# Patient Record
Sex: Female | Born: 1985 | Race: White | Hispanic: No | Marital: Single | Smoking: Current some day smoker
Health system: Southern US, Community
[De-identification: ages and names within clinical notes are randomized; demographics above are authoritative.]

## PROBLEM LIST (undated history)

## (undated) DIAGNOSIS — I10 Essential (primary) hypertension: Secondary | ICD-10-CM

## (undated) DIAGNOSIS — E119 Type 2 diabetes mellitus without complications: Secondary | ICD-10-CM

## (undated) DIAGNOSIS — F909 Attention-deficit hyperactivity disorder, unspecified type: Secondary | ICD-10-CM

## (undated) DIAGNOSIS — E282 Polycystic ovarian syndrome: Secondary | ICD-10-CM

## (undated) DIAGNOSIS — J45909 Unspecified asthma, uncomplicated: Secondary | ICD-10-CM

## (undated) DIAGNOSIS — F99 Mental disorder, not otherwise specified: Secondary | ICD-10-CM

## (undated) DIAGNOSIS — F32A Depression, unspecified: Secondary | ICD-10-CM

## (undated) DIAGNOSIS — G43909 Migraine, unspecified, not intractable, without status migrainosus: Secondary | ICD-10-CM

## (undated) HISTORY — DX: Attention-deficit hyperactivity disorder, unspecified type: F90.9

## (undated) HISTORY — DX: Type 2 diabetes mellitus without complications: E11.9

## (undated) HISTORY — DX: Unspecified asthma, uncomplicated: J45.909

## (undated) HISTORY — DX: Migraine, unspecified, not intractable, without status migrainosus: G43.909

## (undated) HISTORY — DX: Essential (primary) hypertension: I10

## (undated) HISTORY — DX: Polycystic ovarian syndrome: E28.2

## (undated) HISTORY — DX: Mental disorder, not otherwise specified: F99

## (undated) HISTORY — DX: Depression, unspecified: F32.A

---
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
## 2009-12-04 ENCOUNTER — Ambulatory Visit: Payer: Self-pay | Admitting: Family Medicine

## 2009-12-04 DIAGNOSIS — F438 Other reactions to severe stress: Secondary | ICD-10-CM | POA: Insufficient documentation

## 2009-12-04 DIAGNOSIS — F4389 Other reactions to severe stress: Secondary | ICD-10-CM | POA: Insufficient documentation

## 2009-12-11 ENCOUNTER — Encounter: Payer: Self-pay | Admitting: Family Medicine

## 2009-12-27 ENCOUNTER — Telehealth (INDEPENDENT_AMBULATORY_CARE_PROVIDER_SITE_OTHER): Payer: Self-pay | Admitting: *Deleted

## 2010-01-01 ENCOUNTER — Telehealth: Payer: Self-pay | Admitting: Family Medicine

## 2010-11-26 NOTE — Assessment & Plan Note (Signed)
Summary: NOV: OCPs, mood   Vital Signs:  Patient profile:   25 year old female Height:      65 inches Weight:      208.75 pounds BMI:     34.86 Temp:     98.4 degrees F oral Pulse rate:   88 / minute BP sitting:   122 / 86  Vitals Entered By: Kandice Hams (December 04, 2009 1:55 PM) CC: new patient Comments was on BCP because of endometriosis, D/C 2008, C/O very heavy painful bleeding   CC:  new patient.  History of Present Illness: Was on birth control and came off in 2008. Hx of endometriosis. Now havnig painful heavy bleeding. Bleeds for 7 days.  Bleeding is mostly heavy.  About every 28 hours. Was on sprintec years ago and felt good on it.    Feels mood and irritable all the time. Sleeping is OK. Feels down most time.  Living with grandparents and this has been difficult.  Mom with depression, Bipolar.  Lots of mental illness in the family. No panic attacks. Mom with thyroid d/o.   Habits & Providers  Alcohol-Tobacco-Diet     Alcohol drinks/day: <1     Tobacco Status: current     Cigarette Packs/Day: 1.0  Exercise-Depression-Behavior     Have you felt down or hopeless? no     Drug Use: no     Seat Belt Use: always  Past History:  Past Medical History: None  Past Surgical History: Knee surgery 03/2006  Family History: Family History of Alcoholism/Addiction/father heart attack//father Family History Depression//mother father,grandma DM - Uncle, GM Mother with Bipllar, uncle with Bipolar Mothe wtih COPD, GFM COPD,   Social History: Single Completed 11th grade. Live wiht mother and GM.   Current Smoker Alcohol use-yes Drug use-no 3 caffeinated drinks per day.  Smoking Status:  current Packs/Day:  1.0 Drug Use:  no Seat Belt Use:  always  Review of Systems       No fever/sweats/weakness, unexplained weight loss/gain.  No vison changes.  No difficulty hearing/ringing in ears, hay fever/allergies.  No chest pain/discomfort, palpitations.  No Br  lump/nipple discharge.  No cough/wheeze.  No blood in BM, nausea/vomiting/diarrhea.  No nighttime urination, leaking urine, unusual vaginal bleeding, discharge (penis or vagina).  No muscle/joint pain. No rash, change in mole.  No HA, memory loss.  No anxiety, sleep d/o, depression.  No easy bruising/bleeding, unexplained lump   Physical Exam  General:  Well-developed,well-nourished,in no acute distress; alert,appropriate and cooperative throughout examination Neck:  No deformities, masses, or tenderness noted. No TM.   Lungs:  Normal respiratory effort, chest expands symmetrically. Lungs are clear to auscultation, no crackles or wheezes. Heart:  Normal rate and regular rhythm. S1 and S2 normal without gallop, murmur, click, rub or other extra sounds.   Impression & Recommendations:  Problem # 1:  CONTRACEPTIVE MANAGEMENT (ICD-V25.09) Discussed options.  will start sprintec fo better control of her periods and endometriosis.  This may even help her irritability since it helped in the past. Also let her know about the free pap smear screenings which she needs to do. her last pap was 3 years ago and is having heavy bleeding.    Problem # 2:  OTHER ACUTE REACTIONS TO STRESS (ICD-308.3) Discussed that hopefully being on birth control will help but also discussed starting an SSRI. Warned of potential SE. Call if any concerns. F/U in 6 weeks if able. She doesn't have insurance.  She is currently not  working.  Normally would screen thyroid but she prefers not since no insurance. Mood questionnair neg screen for Bipolar.  PHQ9 score is 4 (none to mild sxs).   Complete Medication List: 1)  Sprintec 28 0.25-35 Mg-mcg Tabs (Norgestimate-eth estradiol) .... Take 1 tablet by mouth once a day 2)  Citalopram Hydrobromide 20 Mg Tabs (Citalopram hydrobromide) .... 1/2 tab by mouth once a day, then increase to whole tab. Prescriptions: CITALOPRAM HYDROBROMIDE 20 MG TABS (CITALOPRAM HYDROBROMIDE) 1/2 tab by mouth  once a day, then increase to whole tab.  #30 x 2   Entered and Authorized by:   Nani Gasser MD   Signed by:   Nani Gasser MD on 12/04/2009   Method used:   Electronically to        [facility] [phone]* (retail)       [address] [place], [place]  [address]       Ph: [phone]       Fax: [phone]   RxID:   [id number] SPRINTEC 28 0.25-35 MG-MCG TABS (NORGESTIMATE-ETH ESTRADIOL) Take 1 tablet by mouth once a day  #90 day supl x 1   Entered and Authorized by:   Nani Gasser MD   Signed by:   Nani Gasser MD on 12/04/2009   Method used:   Electronically to        [facility] [phone]* (retail)       [address] [place], [place]  [address]       Ph: [phone]       Fax: [phone]   RxID:   [id number]

## 2010-11-26 NOTE — Progress Notes (Signed)
Summary: Can not take Citalopram  Phone Note Call from Patient Call back at Home Phone [phone]   Caller: Mom Call For: Nani Gasser MD Summary of Call: mom calls and states that the Citalopram is causing daughter to throw-up and can not take it. Wonders if you can call in something else for her to [facility] on there generic plan Initial call taken by: Kathlene November,  January 01, 2010 9:36 AM  Follow-up for Phone Call        OK will change to fluoxtine.  Follow-up by: Nani Gasser MD,  January 01, 2010 10:57 AM  Additional Follow-up for Phone Call Additional follow up Details #1::        Mom notified med sent Additional Follow-up by: Kathlene November,  January 01, 2010 11:30 AM    New/Updated Medications: FLUOXETINE HCL 10 MG CAPS (FLUOXETINE HCL) Take 1 tablet by mouth once a day Prescriptions: FLUOXETINE HCL 10 MG CAPS (FLUOXETINE HCL) Take 1 tablet by mouth once a day  #30 x 0   Entered and Authorized by:   Nani Gasser MD   Signed by:   Nani Gasser MD on 01/01/2010   Method used:   Electronically to        [facility] [phone]* (retail)       [address] [place], [place]  [address]       Ph: [phone]       Fax: [phone]   RxID:   [id number]

## 2010-11-26 NOTE — Miscellaneous (Signed)
  Clinical Lists Changes  Medications: Changed medication from CITALOPRAM HYDROBROMIDE 20 MG TABS (CITALOPRAM HYDROBROMIDE) 1/2 tab by mouth once a day, then increase to whole tab. to CITALOPRAM HYDROBROMIDE 20 MG TABS (CITALOPRAM HYDROBROMIDE) 1/2 tab by mouth once a day for 1 week, then increase to whole tab. - Signed Rx of CITALOPRAM HYDROBROMIDE 20 MG TABS (CITALOPRAM HYDROBROMIDE) 1/2 tab by mouth once a day for 1 week, then increase to whole tab.;  #30 x 3;  Signed;  Entered by: Kathlene November;  Authorized by: Nani Gasser MD;  Method used: Electronically to [facility] [address], [place], [place]  [address], Ph: [phone], Fax: [phone]    Prescriptions: CITALOPRAM HYDROBROMIDE 20 MG TABS (CITALOPRAM HYDROBROMIDE) 1/2 tab by mouth once a day for 1 week, then increase to whole tab.  #30 x 3   Entered by:   Kathlene November   Authorized by:   Nani Gasser MD   Signed by:   Kathlene November on 12/11/2009   Method used:   Electronically to        [facility] [phone]* (retail)       [address] [place], [place]  [address]       Ph: [phone]       Fax: [phone]   RxID:   [id number]

## 2010-11-26 NOTE — Progress Notes (Signed)
----   Converted from flag ---- ---- 12/27/2009 8:05 AM, Nani Gasser MD wrote:   ---- 12/27/2009 8:05 AM, Nani Gasser MD wrote: Call pt: If she wasnt to schedule a free pap smear call [phone] and the 2 free clinics they have are at our office on April 4th and in [place] on March 28, I think both start at 5:30PM.   ---- 12/04/2009 2:31 PM, Nani Gasser MD wrote: Call and give infor for free pap smear ------------------------------  Pt notified. KJ LPN

## 2010-11-29 NOTE — Letter (Signed)
Summary: Depression Questionnaire/[place] Rachael Fowler  Depression Questionnaire/[place] Rachael Fowler   Imported By: Lanelle Bal 12/07/2009 13:34:49  _____________________________________________________________________  External Attachment:    Type:   Image     Comment:   External Document

## 2017-07-14 ENCOUNTER — Encounter: Payer: Self-pay | Admitting: Osteopathic Medicine

## 2017-07-14 ENCOUNTER — Ambulatory Visit (INDEPENDENT_AMBULATORY_CARE_PROVIDER_SITE_OTHER): Payer: Self-pay | Admitting: Osteopathic Medicine

## 2017-07-14 VITALS — BP 141/84 | HR 100 | Temp 98.3°F | Ht 65.0 in | Wt 209.0 lb

## 2017-07-14 DIAGNOSIS — J4541 Moderate persistent asthma with (acute) exacerbation: Secondary | ICD-10-CM

## 2017-07-14 DIAGNOSIS — R0981 Nasal congestion: Secondary | ICD-10-CM

## 2017-07-14 DIAGNOSIS — J40 Bronchitis, not specified as acute or chronic: Secondary | ICD-10-CM

## 2017-07-14 MED ORDER — PREDNISONE 20 MG PO TABS: 20.0000 mg | ORAL_TABLET | Freq: Two times a day (BID) | ORAL | 0 refills | Status: DC

## 2017-07-14 MED ORDER — GUAIFENESIN-CODEINE 100-10 MG/5ML PO SYRP: 5.0000 mL | ORAL_SOLUTION | Freq: Four times a day (QID) | ORAL | 0 refills | Status: DC | PRN

## 2017-07-14 MED ORDER — IPRATROPIUM BROMIDE 0.06 % NA SOLN: 2.0000 | Freq: Four times a day (QID) | NASAL | 1 refills | Status: DC

## 2017-07-14 MED ORDER — METHYLPREDNISOLONE SODIUM SUCC 125 MG IJ SOLR
125.0000 mg | Freq: Once | INTRAMUSCULAR | Status: AC
  Administered 2017-07-14: 125 mg via INTRAMUSCULAR

## 2017-07-14 MED ORDER — AZITHROMYCIN 250 MG PO TABS: ORAL_TABLET | ORAL | 0 refills | Status: DC

## 2017-07-14 MED ORDER — ALBUTEROL SULFATE (2.5 MG/3ML) 0.083% IN NEBU: 2.5000 mg | INHALATION_SOLUTION | RESPIRATORY_TRACT | 6 refills | Status: DC | PRN

## 2017-07-14 NOTE — Patient Instructions (Addendum)
Plan: Asthma treatment with inhaled medications, steroids (shot in office and start oral medications in 1-2 days), and antibiotics. MUST STOP SMOKING! See below for other home remedies and OTC medications. We should consider daily inhaler to prevent asthma problems depending on your breathing - please follow-up in the office for routine checkup when you're feeling better   Over-the-Counter Medications & Home Remedies for Upper Respiratory Illness  Note: the following list assumes no pregnancy, normal liver & kidney function and no other drug interactions. Dr. Lyn Hollingshead has highlighted medications which are safe for you to use, but these may not be appropriate for everyone. Always ask a pharmacist or qualified medical provider if you have any questions!   Aches/Pains, Fever, Headache Acetaminophen (Tylenol) 500 mg tablets - take max 2 tablets (1000 mg) every 6 hours (4 times per day)  Ibuprofen (Motrin) 200 mg tablets - take max 4 tablets (800 mg) every 6 hours*  Sinus Congestion Prescription Atrovent as directed Nasal Saline if desired Oxymetolazone (Afrin, others) sparing use due to rebound congestion, NEVER use in kids Phenylephrine (Sudafed) 10 mg tablets every 4 hours (or the 12-hour formulation)* Diphenhydramine (Benadryl) 25 mg tablets - take max 2 tablets every 4 hours  Cough & Sore Throat Prescription cough pills or syrups as directed OR Dextromethorphan (Robitussin, others) - cough suppressant Guaifenesin (Robitussin, Mucinex, others) - expectorant (helps cough up mucus) (Dextromethorphan and Guaifenesin also come in a combination tablet) Other remedies:  Lozenges w/ Benzocaine + Menthol (Cepacol) Honey - as much as you want! Teas which "coat the throat" - look for ingredients Elm Bark, Licorice Root, Marshmallow Root  Other Antibiotics if these are prescribed - take ALL, even if you're feeling better  Zinc Lozenges within 24 hours of symptoms onset - mixed evidence this  shortens the duration of the common cold Don't waste your money on Vitamin C or Echinacea  *Caution in patients with high blood pressure

## 2017-07-14 NOTE — Progress Notes (Signed)
HPI: Rachael Fowler is a 31 y.o. female  who presents to [facility] today, 07/14/17,  for chief complaint of:  Chief Complaint  Patient presents with  . Establish Care    COUGH    Cough . Context: partner now sick with simlar symptoms but hers ongoing since last week. Hx asthma.  . Location/Quality: dry cough, coughing spells keeping her up  . Duration: 4 days . Assoc signs/symptoms: sore throat, headache, wheezing    Past medical, surgical, social and family history reviewed: Patient Active Problem List   Diagnosis Date Noted  . OTHER ACUTE REACTIONS TO STRESS 12/04/2009   No past surgical history on file. Social History  Substance Use Topics  . Smoking status: Current Every Day Smoker  . Smokeless tobacco: Never Used  . Alcohol use Not on file   No family history on file.   Current medication list and allergy/intolerance information reviewed:   Current Outpatient Prescriptions  Medication Sig Dispense Refill  . diphenhydramine-acetaminophen (TYLENOL PM EXTRA STRENGTH) 25-500 MG TABS tablet Take 1 tablet by mouth at bedtime as needed.    . Melatonin 10 MG CAPS Take by mouth.     No current facility-administered medications for this visit.    No Known Allergies    Review of Systems:  Constitutional:  +subjective fever, no chills, +recent illness, No unintentional weight changes. +significant fatigue.   HEENT: +frontal sinus headache, no vision change, no hearing change, +sore throat, +sinus pressure  Cardiac: No  chest pain, No  pressure, No palpitations,  Respiratory:  No  shortness of breath. +Cough  Gastrointestinal: No  abdominal pain, No  nausea, No  vomiting,  Musculoskeletal: No new myalgia/arthralgia  Skin: No  Rash,   Neurologic: No  weakness, No  dizziness  Psychiatric: No  concerns with depression, No  concerns with anxiety, No sleep problems, No mood problems  Exam:  BP (!) 141/84   Pulse 100   Temp 98.3 F  (36.8 C) (Oral)   Ht  (1.651 m)   Wt 209 lb (94.8 kg)   BMI 34.78 kg/m   Constitutional: VS see above. General Appearance: alert, well-developed, well-nourished, NAD  Eyes: Normal lids and conjunctive, non-icteric sclera  Ears, Nose, Mouth, Throat: MMM, Normal external inspection ears/nares/mouth/lips/gums. TM normal bilaterally. Pharynx/tonsils no erythema, no exudate. Nasal mucosa normal.   Neck: No masses, trachea midline. No thyroid enlargement. No tenderness/mass appreciated. No lymphadenopathy  Respiratory: Normal respiratory effort. + diffuse wheeze, no rhonchi, no rales  Cardiovascular: S1/S2 normal, no murmur, no rub/gallop auscultated. RRR. No lower extremity edema.   Musculoskeletal: Gait normal.   Neurological: Normal balance/coordination. No tremor.   Skin: warm, dry, intact. No rash/ulcer.  Psychiatric: Normal judgment/insight. Normal mood and affect. Oriented x3.      ASSESSMENT/PLAN:   Moderate persistent asthma with exacerbation - Plan: predniSONE (DELTASONE) 20 MG tablet, azithromycin (ZITHROMAX) 250 MG tablet, albuterol (PROVENTIL) (2.5 MG/3ML) 0.083% nebulizer solution  Bronchitis - Plan: predniSONE (DELTASONE) 20 MG tablet, azithromycin (ZITHROMAX) 250 MG tablet, guaiFENesin-codeine (ROBITUSSIN AC) 100-10 MG/5ML syrup, methylPREDNISolone sodium succinate (SOLU-MEDROL) 125 mg/2 mL injection 125 mg  Sinus congestion - Plan: ipratropium (ATROVENT) 0.06 % nasal spray    Patient Instructions  Plan: Asthma treatment with inhaled medications, steroids (shot in office and start oral medications in 1-2 days), and antibiotics. MUST STOP SMOKING! See below for other home remedies and OTC medications. We should consider daily inhaler to prevent asthma problems depending on your breathing - please  follow-up in the office for routine checkup when you're feeling better   Over-the-Counter Medications & Home Remedies for Upper Respiratory Illness  Note: the  following list assumes no pregnancy, normal liver & kidney function and no other drug interactions. Dr. Lyn Hollingshead has highlighted medications which are safe for you to use, but these may not be appropriate for everyone. Always ask a pharmacist or qualified medical provider if you have any questions!   Aches/Pains, Fever, Headache Acetaminophen (Tylenol) 500 mg tablets - take max 2 tablets (1000 mg) every 6 hours (4 times per day)  Ibuprofen (Motrin) 200 mg tablets - take max 4 tablets (800 mg) every 6 hours*  Sinus Congestion Prescription Atrovent as directed Nasal Saline if desired Oxymetolazone (Afrin, others) sparing use due to rebound congestion, NEVER use in kids Phenylephrine (Sudafed) 10 mg tablets every 4 hours (or the 12-hour formulation)* Diphenhydramine (Benadryl) 25 mg tablets - take max 2 tablets every 4 hours  Cough & Sore Throat Prescription cough pills or syrups as directed OR Dextromethorphan (Robitussin, others) - cough suppressant Guaifenesin (Robitussin, Mucinex, others) - expectorant (helps cough up mucus) (Dextromethorphan and Guaifenesin also come in a combination tablet) Other remedies:  Lozenges w/ Benzocaine + Menthol (Cepacol) Honey - as much as you want! Teas which "coat the throat" - look for ingredients Elm Bark, Licorice Root, Marshmallow Root  Other Antibiotics if these are prescribed - take ALL, even if you're feeling better  Zinc Lozenges within 24 hours of symptoms onset - mixed evidence this shortens the duration of the common cold Don't waste your money on Vitamin C or Echinacea  *Caution in patients with high blood pressure       Visit summary with medication list and pertinent instructions was printed for patient to review. All questions at time of visit were answered - patient instructed to contact office with any additional concerns. ER/RTC precautions were reviewed with the patient. Follow-up plan: Return if symptoms worsen or fail to  improve, and for asthma .  Note: Total time spent 30 minutes, greater than 50% of the visit was spent face-to-face counseling and coordinating care for the following: The primary encounter diagnosis was Moderate persistent asthma with exacerbation. Diagnoses of Bronchitis and Sinus congestion were also pertinent to this visit.[name]

## 2017-07-15 ENCOUNTER — Encounter: Payer: Self-pay | Admitting: Osteopathic Medicine

## 2017-07-23 ENCOUNTER — Telehealth: Payer: Self-pay | Admitting: Osteopathic Medicine

## 2017-07-23 NOTE — Telephone Encounter (Signed)
error 

## 2017-07-23 NOTE — Telephone Encounter (Signed)
Called patient to advise that paperwork is ready for pickup but was unable to reach the patient due to phone number no longer being in service, also the home number belongs to a female so I did not leave a message . Edel Rivero,CMA

## 2017-07-23 NOTE — Telephone Encounter (Signed)
Please call patient: I have completed her FMLA paperwork and left this up front for her to pick up, we are unable to fax to directly since she did not sign the appropriate release. She would like to stop by the office to sign and we can fax it at that time, or she can take it herself to her HR department

## 2017-08-07 ENCOUNTER — Ambulatory Visit: Payer: Self-pay | Admitting: Osteopathic Medicine

## 2017-08-19 ENCOUNTER — Encounter: Payer: Self-pay | Admitting: Osteopathic Medicine

## 2017-08-19 ENCOUNTER — Ambulatory Visit (INDEPENDENT_AMBULATORY_CARE_PROVIDER_SITE_OTHER): Payer: Self-pay | Admitting: Osteopathic Medicine

## 2017-08-19 VITALS — BP 125/86 | HR 92 | Ht 64.0 in | Wt 215.0 lb

## 2017-08-19 DIAGNOSIS — E119 Type 2 diabetes mellitus without complications: Secondary | ICD-10-CM

## 2017-08-19 DIAGNOSIS — Z0189 Encounter for other specified special examinations: Secondary | ICD-10-CM

## 2017-08-19 DIAGNOSIS — Z3490 Encounter for supervision of normal pregnancy, unspecified, unspecified trimester: Secondary | ICD-10-CM

## 2017-08-19 DIAGNOSIS — E1159 Type 2 diabetes mellitus with other circulatory complications: Secondary | ICD-10-CM | POA: Insufficient documentation

## 2017-08-19 HISTORY — DX: Type 2 diabetes mellitus without complications: E11.9

## 2017-08-19 LAB — POCT URINE PREGNANCY: Preg Test, Ur: POSITIVE — AB

## 2017-08-19 LAB — POCT GLYCOSYLATED HEMOGLOBIN (HGB A1C): Hemoglobin A1C: 7.7

## 2017-08-19 MED ORDER — METFORMIN HCL 1000 MG PO TABS: 1000.0000 mg | ORAL_TABLET | Freq: Two times a day (BID) | ORAL | 1 refills | Status: DC

## 2017-08-19 MED ORDER — PRENATAL VITAMIN PLUS LOW IRON 27-1 MG PO TABS: 1.0000 | ORAL_TABLET | Freq: Every day | ORAL | 2 refills | Status: DC

## 2017-08-19 NOTE — Progress Notes (Signed)
HPI: Rachael Fowler is a 31 y.o. female  who [facility], 08[facility]/17,  for chief complaint of:  Chief Complaint  Patient presents with  . Other    she thinks blood sugar is low and thinks she is pregnant    Sugar: been checking at home With mother's glucometer and has been consistently running in the high 100s or low to mid 200s. Has noticed some increased thirst.  LMP 9/12 or 9/19. Has taken 2 positive home pregnancy test. Pregnancy test here in the office today is also positive.    Past medical history, surgical history, social history and family history reviewed.  Patient Active Problem List   Diagnosis Date Noted  . OTHER ACUTE REACTIONS TO STRESS 12/04/2009    Current medication list and allergy/intolerance information reviewed.   Current Outpatient Prescriptions on File Prior to Visit  Medication Sig Dispense Refill  . albuterol (PROVENTIL) (2.5 MG/3ML) 0.083% nebulizer solution Take 3 mLs (2.5 mg total) by nebulization every 4 (four) hours as needed for wheezing or shortness of breath (please include nebulizer machine, hoses, and mask if needed.). 30 vial 6  . diphenhydramine-acetaminophen (TYLENOL PM EXTRA STRENGTH) 25-500 MG TABS tablet Take 1 tablet by mouth at bedtime as needed.    . Melatonin 10 MG CAPS Take by mouth.     No current facility-administered medications on file prior to visit.    No Known Allergies    Review of Systems:  Constitutional: No recent illness  Cardiac: No  chest pain, No  pressure, No palpitations  Respiratory:  No  shortness of breath.  Gastrointestinal: No  abdominal pain  Neurologic: No  weakness, No  Dizziness  Psychiatric: No  concerns with depression, No  concerns with anxiety  Exam:  BP 125/86   Pulse 92   Ht 5\' 4"  (1.626 m)   Wt 215 lb (97.5 kg)   BMI 36.90 kg/m   Constitutional: VS see above. General Appearance: alert, well-developed, well-nourished, NAD  Eyes:  Normal lids and conjunctive, non-icteric sclera  Ears, Nose, Mouth, Throat: MMM, Normal external inspection ears/nares/mouth/lips/gums.  Neck: No masses, trachea midline.   Respiratory: Normal respiratory effort. no wheeze, no rhonchi, no rales  Cardiovascular: S1/S2 normal, no murmur, no rub/gallop auscultated. RRR.   Musculoskeletal: Gait normal. Symmetric and independent movement of all extremities  Neurological: Normal balance/coordination. No tremor.  Skin: warm, dry, intact.   Psychiatric: Normal judgment/insight. Normal mood and affect. Oriented x3.   Results for orders placed or performed in visit on 08/19/17 (from the past 24 hour(s))  POCT HgB A1C     Status: None   Collection Time: 08/19/17  1:44 PM  Result Value Ref Range   Hemoglobin A1C 7.7   POCT urine pregnancy     Status: Abnormal   Collection Time: 08/19/17  1:44 PM  Result Value Ref Range   Preg Test, Ur Positive (A) Negative     ASSESSMENT/PLAN:   Type 2 diabetes mellitus without complication, without long-term current use of insulin (HCC) - Plan: metFORMIN (GLUCOPHAGE) 1000 MG tablet  Patient request for diagnostic testing - Plan: POCT HgB A1C  Pregnancy, unspecified gestational age - Plan: POCT urine pregnancy, Ambulatory referral to Obstetrics / Gynecology    Patient Instructions  Plan:  Referral to OBGYN  Start Metformin, start at 1/2 tablet daily, then increase to 1/2 twice daily, and so on until whole tablet twice daily - unless OBGYN says otherwise   Will hold off  on labs for now until you get Medicaid, but I wouldn't wait more than 4-6 weeks   I think nicotine replacement should be fine, try at lowest dose to start   See below for other info re: diabetes and diet  See attached for medications that are ok in pregnancy, and other general info        Follow-up plan: Return for recheck as needed, otherwise as directed by OBGYN .  Visit summary with medication list and pertinent  instructions was printed for patient to review, alert [place] if any changes needed. All questions at time of visit were answered - patient instructed to contact office with any additional concerns. ER/RTC precautions were reviewed with the patient and understanding verbalized.   Note: Total time spent 25 minutes, greater than 50% of the visit was spent face-to-face counseling and coordinating care for the following: The primary encounter diagnosis was Type 2 diabetes mellitus without complication, without long-term current use of insulin (HCC). Diagnoses of Patient request for diagnostic testing and Pregnancy, unspecified gestational age were also pertinent to this visit.[name]

## 2017-08-19 NOTE — Patient Instructions (Addendum)
Plan:  Referral to OBGYN  Start Metformin, start at 1/2 tablet daily, then increase to 1/2 twice daily, and so on until whole tablet twice daily - unless OBGYN says otherwise   Will hold off on labs for now until you get Medicaid, but I wouldn't wait more than 4-6 weeks   I think nicotine replacement should be fine, try at lowest dose to start   See below for other info re: diabetes and diet      Carbohydrate Counting for Diabetes Mellitus, Adult Carbohydrate counting is a method for keeping track of how many carbohydrates you eat. Eating carbohydrates naturally increases the amount of sugar (glucose) in the blood. Counting how many carbohydrates you eat helps keep your blood glucose within normal limits, which helps you manage your diabetes (diabetes mellitus). It is important to know how many carbohydrates you can safely have in each meal. This is different for every person. A diet and nutrition specialist (registered dietitian) can help you make a meal plan and calculate how many carbohydrates you should have at each meal and snack. Carbohydrates are found in the following foods: Grains, such as breads and cereals. Dried beans and soy products. Starchy vegetables, such as potatoes, peas, and corn. Fruit and fruit juices. Milk and yogurt. Sweets and snack foods, such as cake, cookies, candy, chips, and soft drinks.  How do I count carbohydrates? There are two ways to count carbohydrates in food. You can use either of the methods or a combination of both. Reading "Nutrition Facts" on packaged food The "Nutrition Facts" list is included on the labels of almost all packaged foods and beverages in the [place] It includes: The serving size. Information about nutrients in each serving, including the grams (g) of carbohydrate per serving.  To use the "Nutrition Facts": Decide how many servings you will have. Multiply the number of servings by the number of carbohydrates per serving. The  resulting number is the total amount of carbohydrates that you will be having.  Learning standard serving sizes of other foods When you eat foods containing carbohydrates that are not packaged or do not include "Nutrition Facts" on the label, you need to measure the servings in order to count the amount of carbohydrates: Measure the foods that you will eat with a food scale or measuring cup, if needed. Decide how many standard-size servings you will eat. Multiply the number of servings by 15. Most carbohydrate-rich foods have about 15 g of carbohydrates per serving. For example, if you eat 8 oz (170 g) of strawberries, you will have eaten 2 servings and 30 g of carbohydrates (2 servings x 15 g = 30 g). For foods that have more than one food mixed, such as soups and casseroles, you must count the carbohydrates in each food that is included.  The following list contains standard serving sizes of common carbohydrate-rich foods. Each of these servings has about 15 g of carbohydrates:  hamburger bun or  English muffin.  oz (15 mL) syrup.  oz (14 g) jelly. 1 slice of bread. 1 six-inch tortilla. 3 oz (85 g) cooked rice or pasta. 4 oz (113 g) cooked dried beans. 4 oz (113 g) starchy vegetable, such as peas, corn, or potatoes. 4 oz (113 g) hot cereal. 4 oz (113 g) mashed potatoes or  of a large baked potato. 4 oz (113 g) canned or frozen fruit. 4 oz (120 mL) fruit juice. 4-6 crackers. 6 chicken nuggets. 6 oz (170 g) unsweetened dry cereal. 6  oz (170 g) plain fat-free yogurt or yogurt sweetened with artificial sweeteners. 8 oz (240 mL) milk. 8 oz (170 g) fresh fruit or one small piece of fruit. 24 oz (680 g) popped popcorn.  Example of carbohydrate counting Sample meal 3 oz (85 g) chicken breast. 6 oz (170 g) brown rice. 4 oz (113 g) corn. 8 oz (240 mL) milk. 8 oz (170 g) strawberries with sugar-free whipped topping. Carbohydrate calculation Identify the foods that contain  carbohydrates: Rice. Corn. Milk. Strawberries. Calculate how many servings you have of each food: 2 servings rice. 1 serving corn. 1 serving milk. 1 serving strawberries. Multiply each number of servings by 15 g: 2 servings rice x 15 g = 30 g. 1 serving corn x 15 g = 15 g. 1 serving milk x 15 g = 15 g. 1 serving strawberries x 15 g = 15 g. Add together all of the amounts to find the total grams of carbohydrates eaten: 30 g + 15 g + 15 g + 15 g = 75 g of carbohydrates total. This information is not intended to replace advice given to you by your health care provider. Make sure you discuss any questions you have with your health care provider. Document Released: 10/13/2005 Document Revised: 05/02/2016 Document Reviewed: 03/26/2016 Elsevier Interactive Patient Education  2018 [employer].   Diabetes Mellitus and Food It is important for you to manage your blood sugar (glucose) level. Your blood glucose level can be greatly affected by what you eat. Eating healthier foods in the appropriate amounts throughout the day at about the same time each day will help you control your blood glucose level. It can also help slow or prevent worsening of your diabetes mellitus. Healthy eating may even help you improve the level of your blood pressure and reach or maintain a healthy weight. General recommendations for healthful eating and cooking habits include:  Eating meals and snacks regularly. Avoid going long periods of time without eating to lose weight.  Eating a diet that consists mainly of plant-based foods, such as fruits, vegetables, nuts, legumes, and whole grains.  Using low-heat cooking methods, such as baking, instead of high-heat cooking methods, such as deep frying.  Work with your dietitian to make sure you understand how to use the Nutrition Facts information on food labels. How can food affect me? Carbohydrates Carbohydrates affect your blood glucose level more than any other  type of food. Your dietitian will help you determine how many carbohydrates to eat at each meal and teach you how to count carbohydrates. Counting carbohydrates is important to keep your blood glucose at a healthy level, especially if you are using insulin or taking certain medicines for diabetes mellitus. Alcohol Alcohol can cause sudden decreases in blood glucose (hypoglycemia), especially if you use insulin or take certain medicines for diabetes mellitus. Hypoglycemia can be a life-threatening condition. Symptoms of hypoglycemia (sleepiness, dizziness, and disorientation) are similar to symptoms of having too much alcohol. If your health care provider has given you approval to drink alcohol, do so in moderation and use the following guidelines:  Women should not have more than one drink per day, and men should not have more than two drinks per day. One drink is equal to: ? 12 oz of beer. ? 5 oz of wine. ? 1 oz of hard liquor.  Do not drink on an empty stomach.  Keep yourself hydrated. Have water, diet soda, or unsweetened iced tea.  Regular soda, juice, and other mixers might  contain a lot of carbohydrates and should be counted.  What foods are not recommended? As you make food choices, it is important to remember that all foods are not the same. Some foods have fewer nutrients per serving than other foods, even though they might have the same number of calories or carbohydrates. It is difficult to get your body what it needs when you eat foods with fewer nutrients. Examples of foods that you should avoid that are high in calories and carbohydrates but low in nutrients include:  Trans fats (most processed foods list trans fats on the Nutrition Facts label).  Regular soda.  Juice.  Candy.  Sweets, such as cake, pie, doughnuts, and cookies.  Fried foods.  What foods can I eat? Eat nutrient-rich foods, which will nourish your body and keep you healthy. The food you should eat also  will depend on several factors, including:  The calories you need.  The medicines you take.  Your weight.  Your blood glucose level.  Your blood pressure level.  Your cholesterol level.  You should eat a variety of foods, including:  Protein. ? Lean cuts of meat. ? Proteins low in saturated fats, such as fish, egg whites, and beans. Avoid processed meats.  Fruits and vegetables. ? Fruits and vegetables that may help control blood glucose levels, such as apples, mangoes, and yams.  Dairy products. ? Choose fat-free or low-fat dairy products, such as milk, yogurt, and cheese.  Grains, bread, pasta, and rice. ? Choose whole grain products, such as multigrain bread, whole oats, and brown rice. These foods may help control blood pressure.  Fats. ? Foods containing healthful fats, such as nuts, avocado, olive oil, canola oil, and fish.  Does everyone with diabetes mellitus have the same meal plan? Because every person with diabetes mellitus is different, there is not one meal plan that works for everyone. It is very important that you meet with a dietitian who will help you create a meal plan that is just right for you. This information is not intended to replace advice given to you by your health care provider. Make sure you discuss any questions you have with your health care provider. Document Released: 07/10/2005 Document Revised: 03/20/2016 Document Reviewed: 09/09/2013 Elsevier Interactive Patient Education  2017 [employer]

## 2017-08-26 ENCOUNTER — Telehealth: Payer: Self-pay | Admitting: Osteopathic Medicine

## 2017-08-26 NOTE — Telephone Encounter (Signed)
Pt left VM on triage line that she was spotting and cramping. She is [redacted] weeks pregnant.   When I called back, she was already at the ED for evaluation. No further concerns at this time.

## 2017-11-19 DIAGNOSIS — O10013 Pre-existing essential hypertension complicating pregnancy, third trimester: Secondary | ICD-10-CM

## 2017-12-02 ENCOUNTER — Ambulatory Visit (INDEPENDENT_AMBULATORY_CARE_PROVIDER_SITE_OTHER): Payer: Medicaid Other | Admitting: Osteopathic Medicine

## 2017-12-02 ENCOUNTER — Encounter: Payer: Self-pay | Admitting: Osteopathic Medicine

## 2017-12-02 VITALS — BP 125/64 | HR 105 | Temp 98.4°F | Wt 215.0 lb

## 2017-12-02 DIAGNOSIS — R591 Generalized enlarged lymph nodes: Secondary | ICD-10-CM

## 2017-12-02 DIAGNOSIS — R0989 Other specified symptoms and signs involving the circulatory and respiratory systems: Secondary | ICD-10-CM

## 2017-12-02 NOTE — Progress Notes (Signed)
HPI: Rachael Fowler is a 32 y.o. female who  has a past medical history of Asthma.  she [facility] today, 12/02/17,  for chief complaint of: Knot in the throat   Concern for lump/knot in the R side of her jaw for years. Month and a half past, causing more discomfort. Had it checked at some point with OBGYN and was told it was swollen LN. Now causing ear and jaw pain. Currently [redacted] weeks pregnant. Reports episode of choking on water last night, coughed up small streak of blood, no other dysphagia.      Past medical, surgical, social and family history reviewed:  Patient Active Problem List   Diagnosis Date Noted  . Type 2 diabetes mellitus without complication, without long-term current use of insulin (HCC) 08/19/2017  . Pregnancy 08/19/2017  . OTHER ACUTE REACTIONS TO STRESS 12/04/2009    Past Surgical History:  Procedure Laterality Date  . KNEE SURGERY  04/20/2006    Social History   Tobacco Use  . Smoking status: Current Every Day Smoker  . Smokeless tobacco: Never Used  Substance Use Topics  . Alcohol use: Not on file    Family History  Problem Relation Age of Onset  . Depression Mother   . Diabetes Mother   . Hyperlipidemia Mother   . Hypertension Mother   . Hyperlipidemia Father   . Hypertension Father   . Cancer Maternal Uncle   . Diabetes Maternal Uncle   . Cancer Maternal Grandmother   . Hypertension Maternal Grandmother   . Hyperlipidemia Maternal Grandmother   . Hyperlipidemia Paternal Grandmother      Current medication list and allergy/intolerance information reviewed:    Current Outpatient Medications  Medication Sig Dispense Refill  . albuterol (PROVENTIL) (2.5 MG/3ML) 0.083% nebulizer solution Take 3 mLs (2.5 mg total) by nebulization every 4 (four) hours as needed for wheezing or shortness of breath (please include nebulizer machine, hoses, and mask if needed.). 30 vial 6  .  diphenhydramine-acetaminophen (TYLENOL PM EXTRA STRENGTH) 25-500 MG TABS tablet Take 1 tablet by mouth at bedtime as needed.    . Melatonin 10 MG CAPS Take by mouth.    . metFORMIN (GLUCOPHAGE) 1000 MG tablet Take 1 tablet (1,000 mg total) by mouth 2 (two) times daily with a meal. 180 tablet 1  . Prenatal Vit-Fe Fumarate-FA (PRENATAL VITAMIN PLUS LOW IRON) 27-1 MG TABS Take 1 tablet by mouth daily. 90 tablet 2   No current facility-administered medications for this visit.     No Known Allergies    Review of Systems:  Constitutional:  No  fever, no chills, No recent illness  HEENT: No  headache, no vision change, no hearing change, No sore throat, No  sinus pressure, +R ear pain  Cardiac: No  chest pain, No  pressure, No palpitations, No  Orthopnea  Respiratory:  No  shortness of breath. No  Cough  Gastrointestinal: No  abdominal pain, No  nausea  Musculoskeletal: No new myalgia/arthralgia  Skin: No  Rash   Exam:  BP 125/64   Pulse (!) 105   Temp 98.4 F (36.9 C) (Oral)   Wt 215 lb 0.6 oz (97.5 kg)   BMI 36.91 kg/m   Constitutional: VS see above. General Appearance: alert, well-developed, well-nourished, NAD  Eyes: Normal lids and conjunctive, non-icteric sclera  Ears, Nose, Mouth, Throat: MMM, Normal external inspection ears/nares/mouth/lips/gums. TM normal bilaterally. Pharynx/tonsils no erythema, no exudate. Nasal mucosa normal.Fair dentition, no gingivitis or  abscess noted on R  Neck: No masses, trachea midline. No thyroid enlargement. No tenderness/mass appreciated. No enlarged lymph nodes submental, submandibular, ant/post cervical or supraclavicular. (+)tender LN not enlarged on R submandibular   Respiratory: Normal respiratory effort.   Musculoskeletal: Gait normal. No clubbing/cyanosis of digits.   Neurological: Normal balance/coordination. No tremor.   Skin: warm, dry, intact. No rash/ulcer. No concerning nevi or subq nodules on limited exam.     Psychiatric: Normal judgment/insight. Normal mood and affect. Oriented x3.      ASSESSMENT/PLAN:   Tenderness of lymph node - Plan: CBC with Differential/Platelet, US Soft Tissue Head/Neck    QUIT SMOKING - this will cause throat cancer, don't worry about that w/ this LN now. I think tender lymph node is most likely nothing to worry about but with the pain she is having and the anxiety this is causing she would like to pursue workup. Advised CBC in pregnancy can show increased WBC so this may not help [place] much. [place] usually leads to CT to biopsy which is often normal, etc. Pt aware and wishes to proceed.     Visit summary with medication list and pertinent instructions was printed for patient to review. All questions at time of visit were answered - patient instructed to contact office with any additional concerns. ER/RTC precautions were reviewed with the patient.   Follow-up plan: Return if symptoms worsen or fail to improve.  Note: Total time spent 25 minutes, greater than 50% of the visit was spent face-to-face counseling and coordinating care for the following: The encounter diagnosis was Tenderness of lymph node..  Please note: voice recognition software was used to produce this document, and typos may escape review. Please contact Dr. Lyn Hollingshead for any needed clarifications.

## 2017-12-03 ENCOUNTER — Other Ambulatory Visit: Payer: Medicaid Other

## 2017-12-03 ENCOUNTER — Other Ambulatory Visit: Payer: Self-pay | Admitting: Osteopathic Medicine

## 2017-12-03 DIAGNOSIS — D729 Disorder of white blood cells, unspecified: Secondary | ICD-10-CM

## 2017-12-03 NOTE — Progress Notes (Signed)
Smear review needed

## 2017-12-04 ENCOUNTER — Telehealth: Payer: Self-pay | Admitting: Osteopathic Medicine

## 2017-12-04 LAB — CBC WITH DIFFERENTIAL/PLATELET
BASOS PCT: 0.4 %
Basophils Absolute: 80 cells/uL (ref 0–200)
Eosinophils Absolute: 259 cells/uL (ref 15–500)
Eosinophils Relative: 1.3 %
HCT: 33 % — ABNORMAL LOW (ref 35.0–45.0)
Hemoglobin: 11.5 g/dL — ABNORMAL LOW (ref 11.7–15.5)
Lymphs Abs: 3522 cells/uL (ref 850–3900)
MCH: 29.9 pg (ref 27.0–33.0)
MCHC: 34.8 g/dL (ref 32.0–36.0)
MCV: 85.9 fL (ref 80.0–100.0)
MONOS PCT: 4.4 %
MPV: 10.3 fL (ref 7.5–12.5)
NEUTROS PCT: 76.2 %
Neutro Abs: 15164 cells/uL — ABNORMAL HIGH (ref 1500–7800)
PLATELETS: 414 10*3/uL — AB (ref 140–400)
RBC: 3.84 10*6/uL (ref 3.80–5.10)
RDW: 12.7 % (ref 11.0–15.0)
TOTAL LYMPHOCYTE: 17.7 %
WBC: 19.9 10*3/uL — ABNORMAL HIGH (ref 3.8–10.8)
WBCMIX: 876 {cells}/uL (ref 200–950)

## 2017-12-04 LAB — PATHOLOGIST SMEAR REVIEW

## 2017-12-04 NOTE — Telephone Encounter (Signed)
Can call and let patient know insurance may not cover since not a pregnancy related issue. I'll work on this when I'm back in the office Monday.

## 2017-12-04 NOTE — Telephone Encounter (Signed)
Insurance denied US order. Provider can call to appeal. Phone: [phone]. Case: [id number]. Will route.

## 2017-12-04 NOTE — Telephone Encounter (Signed)
Pt advised of status update. Verbalized understanding.

## 2017-12-23 ENCOUNTER — Ambulatory Visit (INDEPENDENT_AMBULATORY_CARE_PROVIDER_SITE_OTHER): Payer: Medicaid Other | Admitting: Osteopathic Medicine

## 2017-12-23 ENCOUNTER — Encounter: Payer: Self-pay | Admitting: Osteopathic Medicine

## 2017-12-23 VITALS — BP 127/74 | HR 104 | Temp 98.2°F | Wt 217.1 lb

## 2017-12-23 DIAGNOSIS — R591 Generalized enlarged lymph nodes: Secondary | ICD-10-CM

## 2017-12-23 DIAGNOSIS — R0989 Other specified symptoms and signs involving the circulatory and respiratory systems: Secondary | ICD-10-CM

## 2017-12-23 DIAGNOSIS — D72829 Elevated white blood cell count, unspecified: Secondary | ICD-10-CM

## 2017-12-23 DIAGNOSIS — R198 Other specified symptoms and signs involving the digestive system and abdomen: Secondary | ICD-10-CM

## 2017-12-23 DIAGNOSIS — I899 Noninfective disorder of lymphatic vessels and lymph nodes, unspecified: Secondary | ICD-10-CM

## 2017-12-23 NOTE — Patient Instructions (Signed)
I can go ahead and send a referral to ENT who may be able to look into the throat to see what may be causing the choking sensation. I would also recommend you check with the imaging department downstairs about their policy for Medicaid/self-pay patients to get an ultrasound performed.   At this point, if we are not able to get imaging or direct visualization from an ENT specialist, there is not much more I can tell you about the issue. I do not feel any significant enlargement on exam. We can keep monitoring the blood counts and go from there. If they increased her medically, we should seriously consider specialist referral or further testing.

## 2017-12-23 NOTE — Progress Notes (Signed)
HPI: Rachael Fowler is a 32 y.o. female who  has a past medical history of Asthma.  she presents to [facility] today, 12/23/17,  for chief complaint of: Knot in the throat   Concern for lump/knot in the R side of her jaw for years. Month and a half or two months past, causing more discomfort. Had it checked at some point with OBGYN and was told it was swollen LN. Now causing ear and jaw pain. Currently [redacted] weeks pregnant. Saw me about this a few weeks ago for this issue. [place] was not covered on her pregnancy medicaid(?)     Past medical, surgical, social and family history reviewed:  Patient Active Problem List   Diagnosis Date Noted  . Type 2 diabetes mellitus without complication, without long-term current use of insulin ([place]) 08/19/2017  . Pregnancy 08/19/2017  . OTHER ACUTE REACTIONS TO STRESS 12/04/2009    Past Surgical History:  Procedure Laterality Date  . KNEE SURGERY  04/20/2006    Social History   Tobacco Use  . Smoking status: Current Every Day Smoker    Packs/day: 0.50  . Smokeless tobacco: Never Used  Substance Use Topics  . Alcohol use: Not on file    Family History  Problem Relation Age of Onset  . Depression Mother   . Diabetes Mother   . Hyperlipidemia Mother   . Hypertension Mother   . Hyperlipidemia Father   . Hypertension Father   . Cancer Maternal Uncle   . Diabetes Maternal Uncle   . Cancer Maternal Grandmother   . Hypertension Maternal Grandmother   . Hyperlipidemia Maternal Grandmother   . Hyperlipidemia Paternal Grandmother      Current medication list and allergy/intolerance information reviewed:    Current Outpatient Medications  Medication Sig Dispense Refill  . albuterol (PROVENTIL) (2.5 MG/3ML) 0.083% nebulizer solution Take 3 mLs (2.5 mg total) by nebulization every 4 (four) hours as needed for wheezing or shortness of breath (please include nebulizer machine, hoses, and mask if needed.). 30 vial 6   . aspirin 81 MG chewable tablet Chew by mouth.    . Blood Glucose Monitoring Suppl (ACCU-CHEK AVIVA PLUS) w/Device KIT Accu-Chek Aviva Plus Meter  USE AS DIRECTED    . diphenhydramine-acetaminophen (TYLENOL PM EXTRA STRENGTH) 25-500 MG TABS tablet Take 1 tablet by mouth at bedtime as needed.    [name] glucose blood test strip Accu-Chek Aviva Plus test strips  CHECK BLOOD SUGARS 4 TIMES A DAY    . glucose blood test strip Use to monitor blood glucose 6 time(s) daily in pregnancy    . insulin aspart (NOVOLOG FLEXPEN) 100 UNIT/ML FlexPen Inject 6 units before breakfast and lunch  and 10 units before dinner or as directed    . Insulin Detemir (LEVEMIR FLEXTOUCH) 100 UNIT/ML Pen Inject into the skin.    . Melatonin 1 MG CAPS melatonin    . Melatonin 10 MG CAPS Take by mouth.    . metFORMIN (GLUCOPHAGE) 1000 MG tablet Take 1 tablet (1,000 mg total) by mouth 2 (two) times daily with a meal. 180 tablet 1  . Prenatal Vit-Fe Fumarate-FA (PRENATAL VITAMIN PLUS LOW IRON) 27-1 MG TABS Take 1 tablet by mouth daily. 90 tablet 2  . insulin NPH Human (HUMULIN N) 100 UNIT/ML injection Humulin N NPH U-100 Insulin (isophane susp) 100 unit/mL subcutaneous  USE 20 UNITS IN MORNING, AND USE 9 UNITS AT BEDTIME -- WAIT TO START UNTIL INSULIN TEACHING     No  current facility-administered medications for this visit.     Allergies  Allergen Reactions  . Hydrocodone Itching and Nausea And Vomiting    Also nausea and vomiting   . Penicillins Itching, Nausea And Vomiting and Other (See Comments)  . Hydrocodone-Acetaminophen Itching and Nausea And Vomiting      Review of Systems:  Constitutional:  No  fever, no chills, No recent illness  HEENT: No  headache, no vision change, no hearing change, No sore throat, No  sinus pressure, +R ear pain  Cardiac: No  chest pain, No  pressure, No palpitations, No  Orthopnea  Respiratory:  No  shortness of breath. No  Cough  Gastrointestinal: No  abdominal pain, No   nausea  Musculoskeletal: No new myalgia/arthralgia  Skin: No  Rash   Exam:  BP 127/74   Pulse (!) 104   Temp 98.2 F (36.8 C) (Oral)   Wt 217 lb 1.9 oz (98.5 kg)   BMI 37.27 kg/m   Constitutional: VS see above. General Appearance: alert, well-developed, well-nourished, NAD  Eyes: Normal lids and conjunctive, non-icteric sclera  Ears, Nose, Mouth, Throat: MMM, Normal external inspection ears/nares/mouth/lips/gums. TM normal bilaterally. Pharynx/tonsils no erythema, no exudate. Nasal mucosa normal.Fair dentition, no gingivitis or abscess noted on R  Neck: No masses, trachea midline. No thyroid enlargement. No tenderness/mass appreciated. No enlarged lymph nodes submental, submandibular, ant/post cervical or supraclavicular. (+)tender LN not enlarged on R submandibular   Respiratory: Normal respiratory effort.   Musculoskeletal: Gait normal. No clubbing/cyanosis of digits.   Neurological: Normal balance/coordination. No tremor.   Skin: warm, dry, intact. No rash/ulcer. No concerning nevi or subq nodules on limited exam.    Psychiatric: Normal judgment/insight. Normal mood and affect. Oriented x3.   Path Review   Comment: Leukocytosis due to absolute granulocytosis. Myeloid population  consists predominantly of mature  segmented neutrophils with reactive changes.  A few lymphocytes appear reactive. No immature cells are identified.  RBCs demonstrate slight polychromasia. Platelet clumps noted on  smear-count appears increased.  Reviewed by Francis Gaines Rockne Coons, MD  (Electronic Signature on File)    12/04/2017.     Ref Range & Units 3wk ago  WBC 3.8 - 10.8 Thousand/uL 19.9 Abnormally high    RBC 3.80 - 5.10 Million/uL 3.84   Hemoglobin 11.7 - 15.5 g/dL 11.5 Abnormally low    HCT 35.0 - 45.0 % 33.0 Abnormally low    MCV 80.0 - 100.0 fL 85.9   MCH 27.0 - 33.0 pg 29.9   MCHC 32.0 - 36.0 g/dL 34.8   RDW 11.0 - 15.0 % 12.7   Platelets 140 - 400 Thousand/uL 414  Abnormally high    MPV 7.5 - 12.5 fL 10.3   Neutro Abs 1,500 - 7,800 cells/uL 15,164 Abnormally high    Lymphs Abs 850 - 3,900 cells/uL 3,522   WBC mixed population 200 - 950 cells/uL 876   Eosinophils Absolute 15 - 500 cells/uL 259   Basophils Absolute 0 - 200 cells/uL 80   Neutrophils Relative % % 76.2   Total Lymphocyte % 17.7   Monocytes Relative % 4.4   Eosinophils Relative % 1.3   Basophils Relative % 0.4       ASSESSMENT/PLAN:   Leukocytosis, unspecified type - Plan: CBC with Differential/Platelet, Pathologist smear review  Tenderness of lymph node        Visit summary with medication list and pertinent instructions was printed for patient to review. All questions at time of visit were answered - patient instructed  to contact office with any additional concerns. ER/RTC precautions were reviewed with the patient.   Follow-up plan: Return if symptoms worsen or fail to improve.  Note: Total time spent 25 minutes, greater than 50% of the visit was spent face-to-face counseling and coordinating care for the following: The primary encounter diagnosis was Leukocytosis, unspecified type. A diagnosis of Tenderness of lymph node was also pertinent to this visit.[name]  Please note: voice recognition software was used to produce this document, and typos may escape review. Please contact Dr. Sheppard Coil for any needed clarifications.

## 2017-12-24 ENCOUNTER — Other Ambulatory Visit: Payer: Medicaid Other

## 2017-12-24 LAB — CBC WITH DIFFERENTIAL/PLATELET
BASOS PCT: 0.5 %
Basophils Absolute: 111 cells/uL (ref 0–200)
EOS ABS: 200 {cells}/uL (ref 15–500)
EOS PCT: 0.9 %
HCT: 34.5 % — ABNORMAL LOW (ref 35.0–45.0)
HEMOGLOBIN: 11.9 g/dL (ref 11.7–15.5)
Lymphs Abs: 3774 cells/uL (ref 850–3900)
MCH: 29.4 pg (ref 27.0–33.0)
MCHC: 34.5 g/dL (ref 32.0–36.0)
MCV: 85.2 fL (ref 80.0–100.0)
MONOS PCT: 4.8 %
MPV: 10.1 fL (ref 7.5–12.5)
NEUTROS ABS: 17050 {cells}/uL — AB (ref 1500–7800)
Neutrophils Relative %: 76.8 %
Platelets: 428 10*3/uL — ABNORMAL HIGH (ref 140–400)
RBC: 4.05 10*6/uL (ref 3.80–5.10)
RDW: 12.8 % (ref 11.0–15.0)
Total Lymphocyte: 17 %
WBC mixed population: 1066 cells/uL — ABNORMAL HIGH (ref 200–950)
WBC: 22.2 10*3/uL — AB (ref 3.8–10.8)

## 2017-12-24 LAB — PATHOLOGIST SMEAR REVIEW

## 2018-01-04 ENCOUNTER — Other Ambulatory Visit: Payer: Self-pay | Admitting: Osteopathic Medicine

## 2018-01-04 DIAGNOSIS — D72829 Elevated white blood cell count, unspecified: Secondary | ICD-10-CM

## 2018-01-04 NOTE — Progress Notes (Signed)
UCx ordered

## 2018-02-05 ENCOUNTER — Encounter: Payer: Self-pay | Admitting: Family Medicine

## 2018-02-05 ENCOUNTER — Ambulatory Visit (INDEPENDENT_AMBULATORY_CARE_PROVIDER_SITE_OTHER): Payer: Medicaid Other | Admitting: Family Medicine

## 2018-02-05 VITALS — BP 128/74 | HR 115 | Temp 98.2°F | Ht 64.0 in | Wt 219.0 lb

## 2018-02-05 DIAGNOSIS — J4541 Moderate persistent asthma with (acute) exacerbation: Secondary | ICD-10-CM | POA: Diagnosis not present

## 2018-02-05 DIAGNOSIS — J019 Acute sinusitis, unspecified: Secondary | ICD-10-CM

## 2018-02-05 HISTORY — DX: Moderate persistent asthma with (acute) exacerbation: J45.41

## 2018-02-05 MED ORDER — PREDNISONE 20 MG PO TABS: 40.0000 mg | ORAL_TABLET | Freq: Every day | ORAL | 0 refills | Status: DC

## 2018-02-05 MED ORDER — METHYLPREDNISOLONE SODIUM SUCC 125 MG IJ SOLR
125.0000 mg | Freq: Once | INTRAMUSCULAR | Status: AC
  Administered 2018-02-05: 125 mg via INTRAMUSCULAR

## 2018-02-05 MED ORDER — BENZONATATE 200 MG PO CAPS: 200.0000 mg | ORAL_CAPSULE | Freq: Two times a day (BID) | ORAL | 0 refills | Status: DC | PRN

## 2018-02-05 MED ORDER — AZITHROMYCIN 250 MG PO TABS: ORAL_TABLET | ORAL | 0 refills | Status: AC

## 2018-02-05 NOTE — Progress Notes (Signed)
   Subjective:    Patient ID: Rachael Fowler, female    DOB: 03/16/1986, 32 y.o.   MRN: [id number]  HPI 32 year old female who is diabetic.  Comes in today complaining of cough and nasal congestion x 5 days.  Saw OB yesterday [facility] but says [facility] were never [facility]..  Taking Tylenol cold an d flu.  Mucous brown and green.  She is smoker.  She has been using her albuterol.  She is only been getting 1-2 hours of sleep at night before she wakes up with a coughing fit.  She is felt short of breath with it and has had some wheezing.  She is been using albuterol once to twice a day.  She Artie had 2 nebulizer treatments this morning and said it did not really help all that much.  She is still actively smoking while sick.  She feels like she is had some subjective fevers.  No nausea vomiting or diarrhea.    Review of Systems     Objective:   Physical Exam  Constitutional: She is oriented to person, place, and time. She appears well-developed and well-nourished.  HENT:  Head: Normocephalic and atraumatic.  Right Ear: External ear normal.  Left Ear: External ear normal.  Nose: Nose normal.  Mouth/Throat: Oropharynx is clear and moist.  TMs and canals are clear.   Eyes: Pupils are equal, round, and reactive to light. Conjunctivae and EOM are normal.  Neck: Neck supple. No thyromegaly present.  Cardiovascular: Normal rate, regular rhythm and normal heart sounds.  Pulmonary/Chest: Effort normal. She has wheezes.  Out expiratory wheezing in the right upper and lower lung.  Lymphadenopathy:    She has no cervical adenopathy.  Neurological: She is alert and oriented to person, place, and time.  Skin: Skin is warm and dry.  Psychiatric: She has a normal mood and affect.       Assessment & Plan:  Acute sinusitis with asthma exacerbation-we will treat with azithromycin prednisone.  Continue to use albuterol every 6 hours as needed if she is in the yellow zone.  If  not improving then please let us know.  Best peak flow today was 300 slightly below green, into the yellow zone.  Also given IM Solu-Medrol 125 mg per patient request.  Given peak flow meter and set to her best based on height and age.

## 2018-03-16 ENCOUNTER — Encounter: Payer: Self-pay | Admitting: Osteopathic Medicine

## 2018-03-16 ENCOUNTER — Ambulatory Visit (INDEPENDENT_AMBULATORY_CARE_PROVIDER_SITE_OTHER): Payer: Medicaid Other | Admitting: Osteopathic Medicine

## 2018-03-16 DIAGNOSIS — J45991 Cough variant asthma: Secondary | ICD-10-CM

## 2018-03-16 DIAGNOSIS — J4540 Moderate persistent asthma, uncomplicated: Secondary | ICD-10-CM

## 2018-03-16 DIAGNOSIS — J410 Simple chronic bronchitis: Secondary | ICD-10-CM | POA: Diagnosis not present

## 2018-03-16 MED ORDER — BUDESONIDE 0.25 MG/2ML IN SUSP: 0.2500 mg | Freq: Two times a day (BID) | RESPIRATORY_TRACT | 12 refills | Status: DC

## 2018-03-16 NOTE — Progress Notes (Signed)
HPI: Rachael Fowler is a 32 y.o. female who  has a past medical history of Asthma.  she presents to [facility] today, 03/16/18,  for chief complaint of:  Cough  Persistent coughing: >1 month at this point but coughing worse past few days on and off. Hx asthma. Still smoking half to full pack per day depending on stress levels. Currently pregnant, complicated by elevated WBC count, DM2. Mom recently diagnosed with pneumonia and parainfulenza, grandmother diagnosed w/ pneumonia. Steroids at last visit  02/05/18 were temporarily helpful. Not really using albuterol d/t doesn't feel SOB.      Past medical history, surgical history, and family history reviewed.  Current medication list and allergy/intolerance information reviewed.   (See remainder of HPI, ROS, Phys Exam below)    ASSESSMENT/PLAN: Diagnoses of Moderate persistent asthma without complication, Smokers' cough ([place]), and Cough variant asthma were pertinent to this visit.    Meds ordered this encounter  Medications  . budesonide (PULMICORT) 0.25 MG/2ML nebulizer solution    Sig: Take 2 mLs (0.25 mg total) by nebulization 2 (two) times daily.    Dispense:  60 mL    Refill:  12    Patient Instructions  Plan:  Will trial inhaled steroid to help with cough  Do your best to stop or cut back on smoking!   If worse, come see [place] or seek emergency care   Advised as above. Offered CXR but lungs clear on exam and pt denies fever/SOB  Follow-up plan: Return if symptoms worsen or fail to improve.     ############################################ ############################################ ############################################ ############################################    Outpatient Encounter Medications as of 03/16/2018  Medication Sig Note  . albuterol (PROVENTIL) (2.5 MG/3ML) 0.083% nebulizer solution Take 3 mLs (2.5 mg total) by nebulization every 4 (four) hours as needed for  wheezing or shortness of breath (please include nebulizer machine, hoses, and mask if needed.).   [name] benzonatate (TESSALON) 200 MG capsule Take 1 capsule (200 mg total) by mouth 2 (two) times daily as needed for cough.   . Blood Glucose Monitoring Suppl (ACCU-CHEK AVIVA PLUS) w/Device KIT Accu-Chek Aviva Plus Meter  USE AS DIRECTED   . butalbital-acetaminophen-caffeine (FIORICET, ESGIC) 50-325-40 MG tablet Take by mouth.   . citalopram (CELEXA) 20 MG tablet Take 20 mg by mouth daily.   . diphenhydramine-acetaminophen (TYLENOL PM EXTRA STRENGTH) 25-500 MG TABS tablet Take 1 tablet by mouth at bedtime as needed.   [name] glucose blood test strip Use to monitor blood glucose 6 time(s) daily in pregnancy   . insulin aspart (NOVOLOG FLEXPEN) 100 UNIT/ML FlexPen Inject 6 units before breakfast and lunch  and 10 units before dinner or as directed 12/23/2017: As per pt - taking 6u (am/pm/eve)  . Insulin Detemir (LEVEMIR FLEXTOUCH) 100 UNIT/ML Pen Inject 46 Units into the skin.   [name] insulin NPH Human (HUMULIN N) 100 UNIT/ML injection Humulin N NPH U-100 Insulin (isophane susp) 100 unit/mL subcutaneous  USE 20 UNITS IN MORNING, AND USE 9 UNITS AT BEDTIME -- WAIT TO START UNTIL INSULIN TEACHING   . Melatonin 10 MG CAPS Take by mouth.   . metFORMIN (GLUCOPHAGE) 1000 MG tablet Take 1 tablet (1,000 mg total) by mouth 2 (two) times daily with a meal. 12/02/2017: As per pt, taking metformin XR  . nicotine (NICODERM CQ - DOSED IN MG/24 HOURS) 14 mg/24hr patch Place onto the skin.   . predniSONE (DELTASONE) 20 MG tablet Take 2 tablets (40 mg total) by mouth daily  with breakfast.   . Prenatal Vit-Fe Fumarate-FA (PRENATAL VITAMIN PLUS LOW IRON) 27-1 MG TABS Take 1 tablet by mouth daily.    No facility-administered encounter medications on file as of 03/16/2018.    Allergies  Allergen Reactions  . Hydrocodone Itching and Nausea And Vomiting    Also nausea and vomiting   . Penicillins Itching, Nausea And Vomiting and Other  (See Comments)  . Hydrocodone-Acetaminophen Itching and Nausea And Vomiting      Review of Systems:  Constitutional: +recent upper respiraoty illness, no fever/chills   HEENT: No  headache, no vision change  Cardiac: No  chest pain, No  pressure, No palpitations  Respiratory:  No  shortness of breath. +Cough  Gastrointestinal: No  abdominal pain, no change on bowel habits  Musculoskeletal: No new myalgia/arthralgia  Neurologic: No  weakness, No  Dizziness   Exam:  BP (!) 143/88 (BP Location: Left Arm, Patient Position: Sitting, Cuff Size: Normal)   Pulse (!) 108   Temp 98.5 F (36.9 C) (Oral)   Wt 228 lb 8 oz (103.6 kg)   SpO2 97%   BMI 39.22 kg/m   Constitutional: VS see above. General Appearance: alert, well-developed, well-nourished, NAD  Eyes: Normal lids and conjunctive, non-icteric sclera  Ears, Nose, Mouth, Throat: MMM, Normal external inspection ears/nares/mouth/lips/gums.  Neck: No masses, trachea midline.   Respiratory: Normal respiratory effort. no wheeze, no rhonchi, no rales  Cardiovascular: S1/S2 normal, no murmur, no rub/gallop auscultated. RRR.   Musculoskeletal: Gait normal. Symmetric and independent movement of all extremities  Neurological: Normal balance/coordination. No tremor.  Skin: warm, dry, intact.   Psychiatric: Normal judgment/insight. Normal mood and affect. Oriented x3.   Visit summary with medication list and pertinent instructions was printed for patient to review, advised to alert [place] if any changes needed. All questions at time of visit were answered - patient instructed to contact office with any additional concerns. ER/RTC precautions were reviewed with the patient and understanding verbalized.   Follow-up plan: Return if symptoms worsen or fail to improve.  Note: Total time spent 25 minutes, greater than 50% of the visit was spent face-to-face counseling and coordinating care for the following: Diagnoses of Moderate  persistent asthma without complication, Smokers' cough ([place]), and Cough variant asthma were pertinent to this visit.[name]  Please note: voice recognition software was used to produce this document, and typos may escape review. Please contact Dr. Sheppard Coil for any needed clarifications.

## 2018-03-16 NOTE — Patient Instructions (Signed)
Plan:  Will trial inhaled steroid to help with cough  Do your best to stop or cut back on smoking!   If worse, come see [place] or seek emergency care

## 2018-04-15 MED ORDER — GUAIFENESIN 100 MG/5ML PO SYRP
200.00 | ORAL_SOLUTION | ORAL | Status: DC
Start: ? — End: 2018-04-15

## 2018-04-15 MED ORDER — PRENATAL 19 PO TABS
1.00 | ORAL_TABLET | ORAL | Status: DC
Start: 2018-04-14 — End: 2018-04-15

## 2018-04-15 MED ORDER — MEASLES, MUMPS & RUBELLA VAC ~~LOC~~ INJ
0.50 | INJECTION | SUBCUTANEOUS | Status: DC
Start: ? — End: 2018-04-15

## 2018-04-15 MED ORDER — HYDROCODONE-ACETAMINOPHEN 10-325 MG PO TABS
1.00 | ORAL_TABLET | ORAL | Status: DC
Start: ? — End: 2018-04-15

## 2018-04-15 MED ORDER — METFORMIN HCL ER 500 MG PO TB24
500.00 | ORAL_TABLET | ORAL | Status: DC
Start: 2018-04-14 — End: 2018-04-15

## 2018-04-15 MED ORDER — HYDROMORPHONE HCL 1 MG/ML IJ SOLN
1.00 | INTRAMUSCULAR | Status: DC
Start: ? — End: 2018-04-15

## 2018-04-15 MED ORDER — GENERIC EXTERNAL MEDICATION
Status: DC
Start: ? — End: 2018-04-15

## 2018-04-15 MED ORDER — HYDROCORTISONE 1 % EX CREA
TOPICAL_CREAM | CUTANEOUS | Status: DC
Start: 2018-04-13 — End: 2018-04-15

## 2018-04-15 MED ORDER — TETANUS-DIPHTH-ACELL PERTUSSIS 5-2-15.5 LF-MCG/0.5 IM SUSP
0.50 | INTRAMUSCULAR | Status: DC
Start: ? — End: 2018-04-15

## 2018-04-15 MED ORDER — LABETALOL HCL 200 MG PO TABS
200.00 | ORAL_TABLET | ORAL | Status: DC
Start: 2018-04-13 — End: 2018-04-15

## 2018-04-15 MED ORDER — NIFEDIPINE ER 60 MG PO TB24
60.00 | ORAL_TABLET | ORAL | Status: DC
Start: 2018-04-14 — End: 2018-04-15

## 2018-04-15 MED ORDER — BENZOCAINE-MENTHOL 20-0.5 % EX AERO
INHALATION_SPRAY | CUTANEOUS | Status: DC
Start: ? — End: 2018-04-15

## 2018-04-15 MED ORDER — DOCUSATE SODIUM 100 MG PO CAPS
100.00 | ORAL_CAPSULE | ORAL | Status: DC
Start: 2018-04-13 — End: 2018-04-15

## 2018-04-15 MED ORDER — SALINE NASAL SPRAY 0.65 % NA SOLN
2.00 | NASAL | Status: DC
Start: ? — End: 2018-04-15

## 2018-04-15 MED ORDER — MORPHINE SULFATE (PF) 10 MG/ML IV SOLN
10.00 | INTRAVENOUS | Status: DC
Start: ? — End: 2018-04-15

## 2018-04-15 MED ORDER — OXYCODONE HCL 10 MG PO TABS
10.00 | ORAL_TABLET | ORAL | Status: DC
Start: ? — End: 2018-04-15

## 2018-04-15 MED ORDER — MAGNESIUM HYDROXIDE 400 MG/5ML PO SUSP
30.00 | ORAL | Status: DC
Start: ? — End: 2018-04-15

## 2018-04-15 MED ORDER — ACETAMINOPHEN 325 MG PO TABS
650.00 | ORAL_TABLET | ORAL | Status: DC
Start: 2018-04-13 — End: 2018-04-15

## 2018-04-15 MED ORDER — NICOTINE 14 MG/24HR TD PT24
1.00 | MEDICATED_PATCH | TRANSDERMAL | Status: DC
Start: 2018-04-14 — End: 2018-04-15

## 2018-04-15 MED ORDER — LANOLIN EX OINT
TOPICAL_OINTMENT | CUTANEOUS | Status: DC
Start: ? — End: 2018-04-15

## 2018-04-15 MED ORDER — NALBUPHINE HCL 10 MG/ML IJ SOLN
2.00 | INTRAMUSCULAR | Status: DC
Start: ? — End: 2018-04-15

## 2018-04-15 MED ORDER — CITALOPRAM HYDROBROMIDE 20 MG PO TABS
20.00 | ORAL_TABLET | ORAL | Status: DC
Start: 2018-04-14 — End: 2018-04-15

## 2018-04-15 MED ORDER — MORPHINE SULFATE (PF) 4 MG/ML IV SOLN
4.00 | INTRAVENOUS | Status: DC
Start: ? — End: 2018-04-15

## 2018-04-15 MED ORDER — SIMETHICONE 80 MG PO CHEW
80.00 | CHEWABLE_TABLET | ORAL | Status: DC
Start: ? — End: 2018-04-15

## 2018-04-15 MED ORDER — LACTATED RINGERS IV SOLN
125.00 | INTRAVENOUS | Status: DC
Start: ? — End: 2018-04-15

## 2018-04-15 MED ORDER — GABAPENTIN 100 MG PO CAPS
100.00 | ORAL_CAPSULE | ORAL | Status: DC
Start: 2018-04-13 — End: 2018-04-15

## 2018-04-15 MED ORDER — ALUM & MAG HYDROXIDE-SIMETH 200-200-20 MG/5ML PO SUSP
30.00 | ORAL | Status: DC
Start: ? — End: 2018-04-15

## 2018-04-15 MED ORDER — BISACODYL 10 MG RE SUPP
10.00 | RECTAL | Status: DC
Start: ? — End: 2018-04-15

## 2018-04-15 MED ORDER — BENZOCAINE-MENTHOL 15-3.6 MG MT LOZG
1.00 | LOZENGE | OROMUCOSAL | Status: DC
Start: ? — End: 2018-04-15

## 2018-05-18 DIAGNOSIS — Z720 Tobacco use: Secondary | ICD-10-CM | POA: Insufficient documentation

## 2018-05-18 DIAGNOSIS — Z98891 History of uterine scar from previous surgery: Secondary | ICD-10-CM | POA: Insufficient documentation

## 2018-05-18 HISTORY — DX: History of uterine scar from previous surgery: Z98.891

## 2018-05-18 HISTORY — DX: Tobacco use: Z72.0

## 2018-05-26 ENCOUNTER — Encounter: Payer: Self-pay | Admitting: Osteopathic Medicine

## 2018-05-26 ENCOUNTER — Ambulatory Visit (INDEPENDENT_AMBULATORY_CARE_PROVIDER_SITE_OTHER): Payer: Medicaid Other | Admitting: Osteopathic Medicine

## 2018-05-26 VITALS — BP 114/76 | HR 86 | Temp 98.2°F | Wt 202.2 lb

## 2018-05-26 DIAGNOSIS — F419 Anxiety disorder, unspecified: Secondary | ICD-10-CM

## 2018-05-26 DIAGNOSIS — F329 Major depressive disorder, single episode, unspecified: Secondary | ICD-10-CM

## 2018-05-26 DIAGNOSIS — G44229 Chronic tension-type headache, not intractable: Secondary | ICD-10-CM | POA: Insufficient documentation

## 2018-05-26 DIAGNOSIS — F5104 Psychophysiologic insomnia: Secondary | ICD-10-CM

## 2018-05-26 DIAGNOSIS — F32A Depression, unspecified: Secondary | ICD-10-CM

## 2018-05-26 DIAGNOSIS — G43909 Migraine, unspecified, not intractable, without status migrainosus: Secondary | ICD-10-CM

## 2018-05-26 DIAGNOSIS — D729 Disorder of white blood cells, unspecified: Secondary | ICD-10-CM

## 2018-05-26 DIAGNOSIS — D72829 Elevated white blood cell count, unspecified: Secondary | ICD-10-CM

## 2018-05-26 DIAGNOSIS — L680 Hirsutism: Secondary | ICD-10-CM

## 2018-05-26 HISTORY — DX: Hirsutism: L68.0

## 2018-05-26 HISTORY — DX: Anxiety disorder, unspecified: F41.9

## 2018-05-26 HISTORY — DX: Migraine, unspecified, not intractable, without status migrainosus: G43.909

## 2018-05-26 HISTORY — DX: Depression, unspecified: F32.A

## 2018-05-26 HISTORY — DX: Psychophysiologic insomnia: F51.04

## 2018-05-26 HISTORY — DX: Disorder of white blood cells, unspecified: D72.9

## 2018-05-26 LAB — COMPLETE METABOLIC PANEL WITH GFR
AG Ratio: 1.9 (calc) (ref 1.0–2.5)
ALBUMIN MSPROF: 4.5 g/dL (ref 3.6–5.1)
ALKALINE PHOSPHATASE (APISO): 87 U/L (ref 33–115)
ALT: 14 U/L (ref 6–29)
AST: 17 U/L (ref 10–30)
BILIRUBIN TOTAL: 0.3 mg/dL (ref 0.2–1.2)
BUN: 13 mg/dL (ref 7–25)
CHLORIDE: 103 mmol/L (ref 98–110)
CO2: 26 mmol/L (ref 20–32)
Calcium: 9.8 mg/dL (ref 8.6–10.2)
Creat: 0.81 mg/dL (ref 0.50–1.10)
GFR, EST AFRICAN AMERICAN: 113 mL/min/{1.73_m2} (ref >=60)
GFR, Est Non African American: 97 mL/min/{1.73_m2} (ref >=60)
GLOBULIN: 2.4 g/dL (ref 1.9–3.7)
Glucose, Bld: 121 mg/dL — ABNORMAL HIGH (ref 65–99)
Potassium: 4.4 mmol/L (ref 3.5–5.3)
SODIUM: 137 mmol/L (ref 135–146)
Total Protein: 6.9 g/dL (ref 6.1–8.1)

## 2018-05-26 LAB — CBC WITH DIFFERENTIAL/PLATELET
BASOS ABS: 155 {cells}/uL (ref 0–200)
Basophils Relative: 1.3 %
EOS ABS: 571 {cells}/uL — AB (ref 15–500)
Eosinophils Relative: 4.8 %
HEMATOCRIT: 41 % (ref 35.0–45.0)
HEMOGLOBIN: 13.8 g/dL (ref 11.7–15.5)
Lymphs Abs: 3463 cells/uL (ref 850–3900)
MCH: 28.8 pg (ref 27.0–33.0)
MCHC: 33.7 g/dL (ref 32.0–36.0)
MCV: 85.6 fL (ref 80.0–100.0)
MONOS PCT: 6.2 %
MPV: 9.4 fL (ref 7.5–12.5)
NEUTROS ABS: 6973 {cells}/uL (ref 1500–7800)
Neutrophils Relative %: 58.6 %
Platelets: 577 10*3/uL — ABNORMAL HIGH (ref 140–400)
RBC: 4.79 10*6/uL (ref 3.80–5.10)
RDW: 12.8 % (ref 11.0–15.0)
Total Lymphocyte: 29.1 %
WBC: 11.9 10*3/uL — ABNORMAL HIGH (ref 3.8–10.8)
WBCMIX: 738 {cells}/uL (ref 200–950)

## 2018-05-26 MED ORDER — SPIRONOLACTONE 25 MG PO TABS: 25.0000 mg | ORAL_TABLET | Freq: Every day | ORAL | 1 refills | Status: DC

## 2018-05-26 MED ORDER — AMITRIPTYLINE HCL 50 MG PO TABS: 25.0000 mg | ORAL_TABLET | Freq: Every day | ORAL | 1 refills | Status: DC

## 2018-05-26 MED ORDER — CITALOPRAM HYDROBROMIDE 40 MG PO TABS: 40.0000 mg | ORAL_TABLET | Freq: Every day | ORAL | 1 refills | Status: DC

## 2018-05-26 MED ORDER — BUTALBITAL-APAP-CAFFEINE 50-325-40 MG PO TABS: 1.0000 | ORAL_TABLET | ORAL | 0 refills | Status: DC | PRN

## 2018-05-26 NOTE — Progress Notes (Signed)
HPI: Rachael Fowler is a 32 y.o. female who  has a past medical history of Asthma.  [facility] today, 05/26/18,  for chief complaint of:  Medication follow-up  Had her baby last month, IUD in place now. Following with endocrinology - just saw them yesterday. A1C was 5.6.   Smoking - has cut back to one pack per day.  Headaches are still bothering her, using Fioricet almost every day.  No vision change, no nausea, no vomiting.  Reports increased anxiety, a bit worse since delivery of her baby.  She is of course not sleeping very well, trouble getting to sleep and by the time she is asleep she has to wake up again to feed the baby.  No thoughts of hurting herself, hurting the infant.  She has increased the citalopram to 30 mg at the instruction of her OB/GYN.  Has been on this dose for a couple of weeks with minimal difference.  Hair growth: Has some questions about possibly starting spironolactone.  Abnormal white blood cell count, elevated.  No fever, chills.  She is still smoking but she has cut back.   Past medical history, surgical history, and family history reviewed.  Current medication list and allergy/intolerance information reviewed.   (See remainder of HPI, ROS, Phys Exam below)    ASSESSMENT/PLAN:   Anxiety and depression - Plan: citalopram (CELEXA) 40 MG tablet, amitriptyline (ELAVIL) 50 MG tablet  Leukocytosis, unspecified type - Plan: CBC with Differential/Platelet  Abnormal WBC count - Plan: CBC with Differential/Platelet  Hirsutism - Plan: spironolactone (ALDACTONE) 25 MG tablet, CBC with Differential/Platelet, COMPLETE METABOLIC PANEL WITH GFR  Psychophysiological insomnia  Chronic tension-type headache, not intractable - Plan: amitriptyline (ELAVIL) 50 MG tablet, butalbital-acetaminophen-caffeine (FIORICET, ESGIC) 50-325-40 MG tablet   Meds ordered this encounter  Medications  . citalopram (CELEXA) 40 MG  tablet    Sig: Take 1 tablet (40 mg total) by mouth daily.    Dispense:  30 tablet    Refill:  1  . amitriptyline (ELAVIL) 50 MG tablet    Sig: Take 0.5-2 tablets (25-100 mg total) by mouth at bedtime. Start at lowest dose and increase every 2-3 days    Dispense:  45 tablet    Refill:  1  . butalbital-acetaminophen-caffeine (FIORICET, ESGIC) 50-325-40 MG tablet    Sig: Take 1 tablet by mouth every 4 (four) hours as needed for headache. MAX 2 per 24 hours, two days per week    Dispense:  14 tablet    Refill:  0  . spironolactone (ALDACTONE) 25 MG tablet    Sig: Take 1 tablet (25 mg total) by mouth daily. Start with 12.5 mg (half tablet) for one week    Dispense:  30 tablet    Refill:  1    Patient Instructions  For anxiety and insomnia: Let us increase the citalopram up to 40 mg, let us add another medicine in the evening called amitriptyline/Elavil which may help with sleep, anxiety/depression as well as headache prevention.  For headache: Please limit your use of the Fioricet medication to 1 to 2 pills/day, maximum of 2 days/week, ideally less than this over time as the amitriptyline starts to take effect.  Fioricet can cause rebound headache if used too frequently, and it can also have problems with addiction, will plan to be off of this medicine within the next couple of months  For hair growth: We will try starting spironolactone, will need to monitor blood  work with this medicine as well as blood pressure.  Will get baseline labs today, start at half a tablet for the first week and then increase to a full tablet and then after 2 weeks let us plan to recheck the blood work  For abnormal white blood cell count: We are repeating blood work today, but see where were at and decide on next steps from there     Follow-up plan: Return in about 2 weeks (around 06/09/2018) for Recheck on new medications, plan to recheck labs  .     ############################################ ############################################ ############################################ ############################################     Allergies  Allergen Reactions  . Hydrocodone Itching and Nausea And Vomiting    Also nausea and vomiting   . Penicillins Itching, Nausea And Vomiting and Other (See Comments)  . Hydrocodone-Acetaminophen Itching and Nausea And Vomiting      Review of Systems:  Constitutional: No recent illness  HEENT: +headache, no vision change  Cardiac: No  chest pain, No  pressure, No palpitations  Respiratory:  No  shortness of breath. No  Cough  Gastrointestinal: No  abdominal pain, no change on bowel habits  Neurologic: No  weakness, No  Dizziness  Psychiatric: No  concerns with depression, +concerns with anxiety  Exam:  BP 114/76 (BP Location: Left Arm, Patient Position: Sitting, Cuff Size: Normal)   Pulse 86   Temp 98.2 F (36.8 C) (Oral)   Wt 202 lb 3.2 oz (91.7 kg)   Breastfeeding? No   BMI 34.71 kg/m   Constitutional: VS see above. General Appearance: alert, well-developed, well-nourished, NAD  Eyes: Normal lids and conjunctive, non-icteric sclera  Ears, Nose, Mouth, Throat: MMM, Normal external inspection ears/nares/mouth/lips/gums.  Neck: No masses, trachea midline.   Respiratory: Normal respiratory effort.   Musculoskeletal: Gait normal. Symmetric and independent movement of all extremities  Neurological: Normal balance/coordination. No tremor.  Skin: warm, dry, intact.   Psychiatric: Normal judgment/insight. Normal mood and affect. Oriented x3.   Visit summary with medication list and pertinent instructions was printed for patient to review, advised to alert [place] if any changes needed. All questions at time of visit were answered - patient instructed to contact office with any additional concerns. ER/RTC precautions were reviewed with the patient and understanding  verbalized.   Follow-up plan: Return in about 2 weeks (around 06/09/2018) for Recheck on new medications, plan to recheck labs .  Note: Total time spent 25 minutes, greater than 50% of the visit was spent face-to-face counseling and coordinating care for the following: The primary encounter diagnosis was Anxiety and depression. Diagnoses of Leukocytosis, unspecified type, Abnormal WBC count, Hirsutism, Psychophysiological insomnia, and Chronic tension-type headache, not intractable were also pertinent to this visit.[name]  Please note: voice recognition software was used to produce this document, and typos may escape review. Please contact Dr. Lyn Hollingshead for any needed clarifications.

## 2018-05-26 NOTE — Patient Instructions (Addendum)
For anxiety and insomnia: Let us increase the citalopram up to 40 mg, let us add another medicine in the evening called amitriptyline/Elavil which may help with sleep, anxiety/depression as well as headache prevention.  For headache: Please limit your use of the Fioricet medication to 1 to 2 pills/day, maximum of 2 days/week, ideally less than this over time as the amitriptyline starts to take effect.  Fioricet can cause rebound headache if used too frequently, and it can also have problems with addiction, will plan to be off of this medicine within the next couple of months  For hair growth: We will try starting spironolactone, will need to monitor blood work with this medicine as well as blood pressure.  Will get baseline labs today, start at half a tablet for the first week and then increase to a full tablet and then after 2 weeks let us plan to recheck the blood work  For abnormal white blood cell count: We are repeating blood work today, but see where were at and decide on next steps from there

## 2018-06-09 ENCOUNTER — Ambulatory Visit (INDEPENDENT_AMBULATORY_CARE_PROVIDER_SITE_OTHER): Payer: Medicaid Other | Admitting: Osteopathic Medicine

## 2018-06-09 ENCOUNTER — Encounter: Payer: Self-pay | Admitting: Osteopathic Medicine

## 2018-06-09 VITALS — BP 103/70 | HR 76 | Temp 98.0°F | Wt 205.6 lb

## 2018-06-09 DIAGNOSIS — F32A Depression, unspecified: Secondary | ICD-10-CM

## 2018-06-09 DIAGNOSIS — D729 Disorder of white blood cells, unspecified: Secondary | ICD-10-CM

## 2018-06-09 DIAGNOSIS — H6982 Other specified disorders of Eustachian tube, left ear: Secondary | ICD-10-CM

## 2018-06-09 DIAGNOSIS — F419 Anxiety disorder, unspecified: Secondary | ICD-10-CM

## 2018-06-09 DIAGNOSIS — J410 Simple chronic bronchitis: Secondary | ICD-10-CM

## 2018-06-09 DIAGNOSIS — F329 Major depressive disorder, single episode, unspecified: Secondary | ICD-10-CM

## 2018-06-09 DIAGNOSIS — G44229 Chronic tension-type headache, not intractable: Secondary | ICD-10-CM | POA: Diagnosis not present

## 2018-06-09 MED ORDER — CITALOPRAM HYDROBROMIDE 40 MG PO TABS: 40.0000 mg | ORAL_TABLET | Freq: Every day | ORAL | 1 refills | Status: DC

## 2018-06-09 MED ORDER — LORATADINE 10 MG PO TABS: 10.0000 mg | ORAL_TABLET | Freq: Every day | ORAL | 1 refills | Status: DC

## 2018-06-09 MED ORDER — FLUTICASONE PROPIONATE 50 MCG/ACT NA SUSP: 2.0000 | Freq: Every day | NASAL | 6 refills | Status: DC

## 2018-06-09 MED ORDER — AMITRIPTYLINE HCL 25 MG PO TABS: 25.0000 mg | ORAL_TABLET | Freq: Every day | ORAL | 1 refills | Status: DC

## 2018-06-09 NOTE — Progress Notes (Signed)
HPI: Rachael Fowler is a 32 y.o. female who  has a past medical history of Asthma.  she presents to [facility] today, 06/09/18,  for chief complaint of:  Follow-up anxiety, elevated white blood cells  Last visit we discussed anxiety/depression, likely postpartum component.  We went back up on her Celexa she has noticed improvement with the increased dose.  Also added amitriptyline for augmentation as well as help with sleep.  She is doing okay on 25 mg of this.  Headaches are doing a little bit better as well, and no dramatic difference.  She is trying to take Excedrin Migraine rather than the Fioricet.  Labs show improvement in white blood cell count, patient still smoking.  We will continue to monitor, per patient request.    Past medical history, surgical history, and family history reviewed.  Current medication list and allergy/intolerance information reviewed.   (See remainder of HPI, ROS, Phys Exam below)    ASSESSMENT/PLAN:   Eustachian tube dysfunction, left  Anxiety and depression - Plan: amitriptyline (ELAVIL) 25 MG tablet, citalopram (CELEXA) 40 MG tablet  Chronic tension-type headache, not intractable - Plan: amitriptyline (ELAVIL) 25 MG tablet  Smokers' cough ([facility])  Abnormal WBC count - Improved, will continue to monitor.   Meds ordered this encounter  Medications  . amitriptyline (ELAVIL) 25 MG tablet    Sig: Take 1 tablet (25 mg total) by mouth at bedtime.    Dispense:  90 tablet    Refill:  1  . citalopram (CELEXA) 40 MG tablet    Sig: Take 1 tablet (40 mg total) by mouth daily.    Dispense:  90 tablet    Refill:  1  . fluticasone (FLONASE) 50 MCG/ACT nasal spray    Sig: Place 2 sprays into both nostrils daily.    Dispense:  16 g    Refill:  6  . loratadine (CLARITIN) 10 MG tablet    Sig: Take 1 tablet (10 mg total) by mouth daily.    Dispense:  90 tablet    Refill:  1     Follow-up plan: Return in about 6 weeks  (around 07/21/2018) for recheck headaches, sooner if needed! .           ############################################ ############################################ ############################################ ############################################    Outpatient Encounter Medications as of 06/09/2018  Medication Sig Note  . albuterol (PROVENTIL) (2.5 MG/3ML) 0.083% nebulizer solution Take 3 mLs (2.5 mg total) by nebulization every 4 (four) hours as needed for wheezing or shortness of breath (please include nebulizer machine, hoses, and mask if needed.).   [name] amitriptyline (ELAVIL) 50 MG tablet Take 0.5-2 tablets (25-100 mg total) by mouth at bedtime. Start at lowest dose and increase every 2-3 days   . Blood Glucose Monitoring Suppl (ACCU-CHEK AVIVA PLUS) w/Device KIT Accu-Chek Aviva Plus Meter  USE AS DIRECTED   . budesonide (PULMICORT) 0.25 MG/2ML nebulizer solution Take 2 mLs (0.25 mg total) by nebulization 2 (two) times daily.   . butalbital-acetaminophen-caffeine (FIORICET, ESGIC) 50-325-40 MG tablet Take 1 tablet by mouth every 4 (four) hours as needed for headache. MAX 2 per 24 hours, two days per week   . citalopram (CELEXA) 40 MG tablet Take 1 tablet (40 mg total) by mouth daily.   . diphenhydramine-acetaminophen (TYLENOL PM EXTRA STRENGTH) 25-500 MG TABS tablet Take 1 tablet by mouth at bedtime as needed.   [name] glucose blood test strip Use to monitor blood glucose 6 time(s) daily in pregnancy   .  insulin aspart (NOVOLOG FLEXPEN) 100 UNIT/ML FlexPen Inject 6 units before breakfast and lunch  and 10 units before dinner or as directed 12/23/2017: As per pt - taking 6u (am/pm/eve)  . Insulin Detemir (LEVEMIR FLEXTOUCH) 100 UNIT/ML Pen Inject 46 Units into the skin.   [name] insulin NPH Human (HUMULIN N) 100 UNIT/ML injection Humulin N NPH U-100 Insulin (isophane susp) 100 unit/mL subcutaneous  USE 20 UNITS IN MORNING, AND USE 9 UNITS AT BEDTIME -- WAIT TO START UNTIL INSULIN TEACHING    . labetalol (NORMODYNE) 200 MG tablet Take by mouth.   . Melatonin 10 MG CAPS Take by mouth.   . metFORMIN (GLUCOPHAGE) 1000 MG tablet Take 1 tablet (1,000 mg total) by mouth 2 (two) times daily with a meal. 05/26/2018: As per pt, taking metformin er 500 mg once a day  . nicotine (NICODERM CQ - DOSED IN MG/24 HOURS) 14 mg/24hr patch Place onto the skin.   [name] NIFEdipine (PROCARDIA-XL/ADALAT-CC/NIFEDICAL-XL) 30 MG 24 hr tablet Take by mouth.   . spironolactone (ALDACTONE) 25 MG tablet Take 1 tablet (25 mg total) by mouth daily. Start with 12.5 mg (half tablet) for one week    No facility-administered encounter medications on file as of 06/09/2018.    Allergies  Allergen Reactions  . Hydrocodone Itching and Nausea And Vomiting    Also nausea and vomiting   . Penicillins Itching, Nausea And Vomiting and Other (See Comments)  . Hydrocodone-Acetaminophen Itching and Nausea And Vomiting  . Penicillin G Itching      Review of Systems:  Constitutional: No recent illness  HEENT: No  headache, no vision change, +ear pain on L  Cardiac: No  chest pain, No  pressure, No palpitations  Respiratory:  No  shortness of breath. No  Cough  Gastrointestinal: No  abdominal pain, no change on bowel habits  Hem/Onc: No  easy bruising/bleeding, No  abnormal lumps/bumps  Neurologic: No  weakness, No  Dizziness  Psychiatric: No  concerns with depression, +concerns with anxiety  Exam:  BP 103/70 (BP Location: Left Arm, Patient Position: Sitting, Cuff Size: Large)   Pulse 76   Temp 98 F (36.7 C) (Oral)   Wt 205 lb 9.6 oz (93.3 kg)   BMI 35.29 kg/m   Constitutional: VS see above. General Appearance: alert, well-developed, well-nourished, NAD  Eyes: Normal lids and conjunctive, non-icteric sclera  Ears, Nose, Mouth, Throat: MMM, Normal external inspection ears/nares/mouth/lips/gums.  Tympanic membrane is normal bilaterally but some clear effusion behind left.  Neck: No masses, trachea midline.    Respiratory: Normal respiratory effort. no wheeze, no rhonchi, no rales  Cardiovascular: S1/S2 normal, no murmur, no rub/gallop auscultated. RRR.   Musculoskeletal: Gait normal. Symmetric and independent movement of all extremities  Neurological: Normal balance/coordination. No tremor.  Skin: warm, dry, intact.   Psychiatric: Normal judgment/insight. Normal mood and affect. Oriented x3.   Visit summary with medication list and pertinent instructions was printed for patient to review, advised to alert [place] if any changes needed. All questions at time of visit were answered - patient instructed to contact office with any additional concerns. ER/RTC precautions were reviewed with the patient and understanding verbalized.   Follow-up plan: Return in about 6 weeks (around 07/21/2018) for recheck headaches, sooner if needed! .  Note: Total time spent 15 minutes, greater than 50% of the visit was spent face-to-face counseling and coordinating care for the following: The primary encounter diagnosis was Eustachian tube dysfunction, left. Diagnoses of Anxiety and depression, Chronic tension-type headache,  not intractable, Smokers' cough ([place]), and Abnormal WBC count were also pertinent to this visit.[name]  Please note: voice recognition software was used to produce this document, and typos may escape review. Please contact Dr. Sheppard Coil for any needed clarifications.

## 2018-06-10 ENCOUNTER — Encounter: Payer: Self-pay | Admitting: Osteopathic Medicine

## 2018-07-14 ENCOUNTER — Encounter: Payer: Self-pay | Admitting: Osteopathic Medicine

## 2018-07-14 ENCOUNTER — Ambulatory Visit (INDEPENDENT_AMBULATORY_CARE_PROVIDER_SITE_OTHER): Payer: Medicaid Other | Admitting: Osteopathic Medicine

## 2018-07-14 ENCOUNTER — Ambulatory Visit (INDEPENDENT_AMBULATORY_CARE_PROVIDER_SITE_OTHER): Payer: Medicaid Other

## 2018-07-14 DIAGNOSIS — J4540 Moderate persistent asthma, uncomplicated: Secondary | ICD-10-CM

## 2018-07-14 DIAGNOSIS — M79671 Pain in right foot: Secondary | ICD-10-CM

## 2018-07-14 DIAGNOSIS — D72829 Elevated white blood cell count, unspecified: Secondary | ICD-10-CM | POA: Diagnosis not present

## 2018-07-14 DIAGNOSIS — M79672 Pain in left foot: Secondary | ICD-10-CM | POA: Insufficient documentation

## 2018-07-14 DIAGNOSIS — F329 Major depressive disorder, single episode, unspecified: Secondary | ICD-10-CM

## 2018-07-14 DIAGNOSIS — G44229 Chronic tension-type headache, not intractable: Secondary | ICD-10-CM

## 2018-07-14 DIAGNOSIS — F419 Anxiety disorder, unspecified: Secondary | ICD-10-CM

## 2018-07-14 DIAGNOSIS — F32A Depression, unspecified: Secondary | ICD-10-CM

## 2018-07-14 DIAGNOSIS — Z23 Encounter for immunization: Secondary | ICD-10-CM | POA: Diagnosis not present

## 2018-07-14 DIAGNOSIS — E119 Type 2 diabetes mellitus without complications: Secondary | ICD-10-CM

## 2018-07-14 HISTORY — DX: Pain in right foot: M79.671

## 2018-07-14 HISTORY — DX: Pain in left foot: M79.672

## 2018-07-14 MED ORDER — VARENICLINE TARTRATE 1 MG PO TABS: 1.0000 mg | ORAL_TABLET | Freq: Two times a day (BID) | ORAL | 1 refills | Status: DC

## 2018-07-14 MED ORDER — FLUOXETINE HCL 20 MG PO CAPS: 20.0000 mg | ORAL_CAPSULE | Freq: Every day | ORAL | 1 refills | Status: DC

## 2018-07-14 MED ORDER — MELOXICAM 15 MG PO TABS: 15.0000 mg | ORAL_TABLET | Freq: Every day | ORAL | 2 refills | Status: DC

## 2018-07-14 MED ORDER — OMEPRAZOLE 10 MG PO CPDR: 10.0000 mg | DELAYED_RELEASE_CAPSULE | Freq: Every day | ORAL | 3 refills | Status: DC

## 2018-07-14 MED ORDER — VARENICLINE TARTRATE 0.5 MG X 11 & 1 MG X 42 PO MISC: ORAL | 0 refills | Status: DC

## 2018-07-14 NOTE — Patient Instructions (Addendum)
Plan:  We will trial starting Chantix, see printed prescriptions and savings card  We will trial switching from Celexa to Prozac/fluoxetine  Refilled omeprazole  We will get blood work to check up on rheumatoid/other inflammatory problems  Will get x-ray of the foot today to evaluate for possible heel spur, consider sports medicine follow-up to discuss whether orthotics might be helpful

## 2018-07-14 NOTE — Progress Notes (Signed)
HPI: Rachael Fowler is a 32 y.o. female who  has a past medical history of Asthma.  she presents to [facility] today, 07/14/18,  for chief complaint of:  Multiple concerns: see headings below   Depression associated with memory problems.  She stopped taking the citalopram because she was not able to afford a refill of the medication last month.  Quitting smoking: would like to trial Chantix.  Has been bothering her a bit more than usual lately.  Joint pain widespread, was told in the past she might have rheumatoid arthritis. Was told this age 61 when she had knee surgery. Assoc w/ leg cramping.  She states is a bit worse with waking up and then gets a bit better throughout the day.  No wrist or knee pain.  Chronic headaches are doing a bit better on the amitriptyline  Reports pain a little bit worse on the left heel but present on both heels, worse when waking up in the morning, certain shoes seem to bother it a bit more     Past medical history, surgical history, and family history reviewed.  Current medication list and allergy/intolerance information reviewed.   (See remainder of HPI, ROS, Phys Exam below)  Dg Foot 2 Views Left  Result Date: 07/14/2018 CLINICAL DATA:  Bilateral heel pain for 3 days EXAM: LEFT FOOT - 2 VIEW COMPARISON:  None. FINDINGS: There is no evidence of fracture or dislocation. There is no evidence of arthropathy or other focal bone abnormality. IMPRESSION: Negative. Electronically Signed   By: Van Clines M.D.   On: 07/14/2018 16:54   Dg Foot 2 Views Right  Result Date: 07/14/2018 CLINICAL DATA:  Bilateral heel pain for 3 days EXAM: RIGHT FOOT - 2 VIEW COMPARISON:  None. FINDINGS: No significant arthropathy or acute bony findings. No malalignment. No foreign body. IMPRESSION: Negative. Electronically Signed   By: Van Clines M.D.   On: 07/14/2018 16:54         ASSESSMENT/PLAN:   Need for influenza  vaccination - Plan: Flu Vaccine QUAD 6+ mos PF IM (Fluarix Quad PF), ANA, CK, High sensitivity CRP, Rheumatoid factor, Sedimentation rate, CBC with Differential/Platelet, COMPLETE METABOLIC PANEL WITH GFR, TSH, Lipid panel, ANA  Anxiety and depression - Plan: ANA, CK, High sensitivity CRP, Rheumatoid factor, Sedimentation rate, CBC with Differential/Platelet, COMPLETE METABOLIC PANEL WITH GFR, TSH, Lipid panel, ANA  Chronic tension-type headache, not intractable - Plan: ANA, CK, High sensitivity CRP, Rheumatoid factor, Sedimentation rate, CBC with Differential/Platelet, COMPLETE METABOLIC PANEL WITH GFR, TSH, Lipid panel, ANA  Leukocytosis, unspecified type - Plan: ANA, CK, High sensitivity CRP, Rheumatoid factor, Sedimentation rate, CBC with Differential/Platelet, COMPLETE METABOLIC PANEL WITH GFR, TSH, Lipid panel, ANA  Moderate persistent asthma without complication - Plan: ANA, CK, High sensitivity CRP, Rheumatoid factor, Sedimentation rate, CBC with Differential/Platelet, COMPLETE METABOLIC PANEL WITH GFR, TSH, Lipid panel, ANA  Type 2 diabetes mellitus without complication, without long-term current use of insulin (HCC) - Plan: ANA, CK, High sensitivity CRP, Rheumatoid factor, Sedimentation rate, CBC with Differential/Platelet, COMPLETE METABOLIC PANEL WITH GFR, TSH, Lipid panel, ANA  Pain of left heel - Plan: DG Foot 2 Views Left, ANA  Pain of right heel - Plan: DG Foot 2 Views Right   Meds ordered this encounter  Medications  . varenicline (CHANTIX STARTING MONTH PAK) 0.5 MG X 11 & 1 MG X 42 tablet    Sig: Take one 0.5 mg tablet by mouth once daily for 3 days, then  increase to one 0.5 mg tablet twice daily for 4 days, then increase to one 1 mg tablet twice daily.    Dispense:  53 tablet    Refill:  0  . varenicline (CHANTIX CONTINUING MONTH PAK) 1 MG tablet    Sig: Take 1 tablet (1 mg total) by mouth 2 (two) times daily.    Dispense:  90 tablet    Refill:  1  . FLUoxetine (PROZAC)  20 MG capsule    Sig: Take 1 capsule (20 mg total) by mouth daily.    Dispense:  30 capsule    Refill:  1  . meloxicam (MOBIC) 15 MG tablet    Sig: Take 1 tablet (15 mg total) by mouth daily. As needed for aches/pains    Dispense:  30 tablet    Refill:  2  . omeprazole (PRILOSEC) 10 MG capsule    Sig: Take 1 capsule (10 mg total) by mouth daily.    Dispense:  90 capsule    Refill:  3    Patient Instructions  Plan:  We will trial starting Chantix, see printed prescriptions and savings card  We will trial switching from Celexa to Prozac/fluoxetine  Refilled omeprazole  We will get blood work to check up on rheumatoid/other inflammatory problems  Will get x-ray of the foot today to evaluate for possible heel spur, consider sports medicine follow-up to discuss whether orthotics might be helpful    Follow-up plan: Return in about 4 weeks (around 08/11/2018) for check up on medication changes and aches/pains.                  ############################################ ############################################ ############################################ ############################################    Outpatient Encounter Medications as of 07/14/2018  Medication Sig Note  . albuterol (PROVENTIL) (2.5 MG/3ML) 0.083% nebulizer solution Take 3 mLs (2.5 mg total) by nebulization every 4 (four) hours as needed for wheezing or shortness of breath (please include nebulizer machine, hoses, and mask if needed.).   [name] amitriptyline (ELAVIL) 25 MG tablet Take 1 tablet (25 mg total) by mouth at bedtime.   . Blood Glucose Monitoring Suppl (ACCU-CHEK AVIVA PLUS) w/Device KIT Accu-Chek Aviva Plus Meter  USE AS DIRECTED   . budesonide (PULMICORT) 0.25 MG/2ML nebulizer solution Take 2 mLs (0.25 mg total) by nebulization 2 (two) times daily.   . butalbital-acetaminophen-caffeine (FIORICET, ESGIC) 50-325-40 MG tablet Take 1 tablet by mouth every 4 (four) hours as needed for  headache. MAX 2 per 24 hours, two days per week   . citalopram (CELEXA) 40 MG tablet Take 1 tablet (40 mg total) by mouth daily.   . diphenhydramine-acetaminophen (TYLENOL PM EXTRA STRENGTH) 25-500 MG TABS tablet Take 1 tablet by mouth at bedtime as needed.   . fluticasone (FLONASE) 50 MCG/ACT nasal spray Place 2 sprays into both nostrils daily.   [name] glucose blood test strip Use to monitor blood glucose 6 time(s) daily in pregnancy   . labetalol (NORMODYNE) 200 MG tablet Take by mouth.   . loratadine (CLARITIN) 10 MG tablet Take 1 tablet (10 mg total) by mouth daily.   . Melatonin 10 MG CAPS Take by mouth.   . metFORMIN (GLUCOPHAGE) 1000 MG tablet Take 1 tablet (1,000 mg total) by mouth 2 (two) times daily with a meal. 05/26/2018: As per pt, taking metformin er 500 mg once a day  . nicotine (NICODERM CQ - DOSED IN MG/24 HOURS) 14 mg/24hr patch Place onto the skin.   [name] NIFEdipine (PROCARDIA-XL/ADALAT-CC/NIFEDICAL-XL) 30 MG 24 hr tablet Take  by mouth.   . spironolactone (ALDACTONE) 25 MG tablet Take 1 tablet (25 mg total) by mouth daily. Start with 12.5 mg (half tablet) for one week   . insulin aspart (NOVOLOG FLEXPEN) 100 UNIT/ML FlexPen Inject 6 units before breakfast and lunch  and 10 units before dinner or as directed 12/23/2017: As per pt - taking 6u (am/pm/eve)  . Insulin Detemir (LEVEMIR FLEXTOUCH) 100 UNIT/ML Pen Inject 46 Units into the skin.   [name] insulin NPH Human (HUMULIN N) 100 UNIT/ML injection Humulin N NPH U-100 Insulin (isophane susp) 100 unit/mL subcutaneous  USE 20 UNITS IN MORNING, AND USE 9 UNITS AT BEDTIME -- WAIT TO START UNTIL INSULIN TEACHING    No facility-administered encounter medications on file as of 07/14/2018.    Allergies  Allergen Reactions  . Hydrocodone Itching and Nausea And Vomiting    Also nausea and vomiting   . Penicillins Itching, Nausea And Vomiting and Other (See Comments)  . Hydrocodone-Acetaminophen Itching and Nausea And Vomiting  . Penicillin G  Itching      Review of Systems:  Constitutional: No recent illness  HEENT: No  headache, no vision change  Cardiac: No  chest pain, No  pressure, No palpitations  Respiratory:  No  shortness of breath. +Cough  Gastrointestinal: No  abdominal pain, no change on bowel habits  Musculoskeletal: +new myalgia/arthralgia  Skin: No  Rash  Hem/Onc: No  easy bruising/bleeding, No  abnormal lumps/bumps  Neurologic: No  weakness, No  Dizziness  Psychiatric: +concerns with depression, No  concerns with anxiety  Exam:  BP 123/82 (BP Location: Left Arm, Patient Position: Sitting, Cuff Size: Normal)   Pulse 99   Temp 98.3 F (36.8 C) (Oral)   Wt 220 lb 8 oz (100 kg)   BMI 37.85 kg/m   Constitutional: VS see above. General Appearance: alert, well-developed, well-nourished, NAD  Eyes: Normal lids and conjunctive, non-icteric sclera  Ears, Nose, Mouth, Throat: MMM, Normal external inspection ears/nares/mouth/lips/gums.  Neck: No masses, trachea midline.   Respiratory: Normal respiratory effort. + Very faint but diffuse bilateral wheeze, no rhonchi, no rales  Cardiovascular: S1/S2 normal, no murmur, no rub/gallop auscultated. RRR.   Musculoskeletal: Gait normal.  No tenderness to plantar fascia.  Symmetric and independent movement of all extremities  Neurological: Normal balance/coordination. No tremor.  Skin: warm, dry, intact.   Psychiatric: Normal judgment/insight. Normal mood and affect. Oriented x3.   Visit summary with medication list and pertinent instructions was printed for patient to review, advised to alert [place] if any changes needed. All questions at time of visit were answered - patient instructed to contact office with any additional concerns. ER/RTC precautions were reviewed with the patient and understanding verbalized.   Follow-up plan: Return in about 4 weeks (around 08/11/2018) for check up on medication changes and aches/pains.  Note: Total time spent 25  minutes, greater than 50% of the visit was spent face-to-face counseling and coordinating care for the following: Diagnoses of Need for influenza vaccination, Anxiety and depression, Chronic tension-type headache, not intractable, Leukocytosis, unspecified type, Moderate persistent asthma without complication, Type 2 diabetes mellitus without complication, without long-term current use of insulin ([place]), Pain of left heel, and Pain of right heel were pertinent to this visit.[name]  Please note: voice recognition software was used to produce this document, and typos may escape review. Please contact Dr. Sheppard Coil for any needed clarifications.

## 2018-07-16 LAB — COMPLETE METABOLIC PANEL WITH GFR
AG Ratio: 1.7 (calc) (ref 1.0–2.5)
ALBUMIN MSPROF: 4.1 g/dL (ref 3.6–5.1)
ALT: 27 U/L (ref 6–29)
AST: 27 U/L (ref 10–30)
Alkaline phosphatase (APISO): 70 U/L (ref 33–115)
BILIRUBIN TOTAL: 0.3 mg/dL (ref 0.2–1.2)
BUN: 8 mg/dL (ref 7–25)
CALCIUM: 9.4 mg/dL (ref 8.6–10.2)
CHLORIDE: 103 mmol/L (ref 98–110)
CO2: 25 mmol/L (ref 20–32)
Creat: 0.7 mg/dL (ref 0.50–1.10)
GFR, EST AFRICAN AMERICAN: 135 mL/min/{1.73_m2} (ref >=60)
GFR, EST NON AFRICAN AMERICAN: 116 mL/min/{1.73_m2} (ref >=60)
Globulin: 2.4 g/dL (calc) (ref 1.9–3.7)
Glucose, Bld: 135 mg/dL — ABNORMAL HIGH (ref 65–99)
Potassium: 4.2 mmol/L (ref 3.5–5.3)
Sodium: 140 mmol/L (ref 135–146)
TOTAL PROTEIN: 6.5 g/dL (ref 6.1–8.1)

## 2018-07-16 LAB — TSH: TSH: 1.71 mIU/L

## 2018-07-16 LAB — TEST AUTHORIZATION

## 2018-07-16 LAB — RHEUMATOID FACTOR: Rhuematoid fact SerPl-aCnc: <14 IU/mL (ref <14)

## 2018-07-16 LAB — LIPID PANEL
CHOL/HDL RATIO: 4.6 (calc) (ref <5.0)
Cholesterol: 214 mg/dL — ABNORMAL HIGH (ref <200)
HDL: 47 mg/dL — ABNORMAL LOW (ref >50)
LDL CHOLESTEROL (CALC): 119 mg/dL — AB
NON-HDL CHOLESTEROL (CALC): 167 mg/dL — AB (ref <130)
Triglycerides: 337 mg/dL — ABNORMAL HIGH (ref <150)

## 2018-07-16 LAB — CBC WITH DIFFERENTIAL/PLATELET
BASOS PCT: 1.2 %
Basophils Absolute: 145 cells/uL (ref 0–200)
EOS ABS: 726 {cells}/uL — AB (ref 15–500)
Eosinophils Relative: 6 %
HCT: 39.1 % (ref 35.0–45.0)
HEMOGLOBIN: 13.2 g/dL (ref 11.7–15.5)
Lymphs Abs: 3618 cells/uL (ref 850–3900)
MCH: 28.6 pg (ref 27.0–33.0)
MCHC: 33.8 g/dL (ref 32.0–36.0)
MCV: 84.8 fL (ref 80.0–100.0)
MPV: 9.7 fL (ref 7.5–12.5)
Monocytes Relative: 7.5 %
NEUTROS ABS: 6703 {cells}/uL (ref 1500–7800)
Neutrophils Relative %: 55.4 %
Platelets: 447 10*3/uL — ABNORMAL HIGH (ref 140–400)
RBC: 4.61 10*6/uL (ref 3.80–5.10)
RDW: 14.2 % (ref 11.0–15.0)
Total Lymphocyte: 29.9 %
WBC: 12.1 10*3/uL — AB (ref 3.8–10.8)
WBCMIX: 908 {cells}/uL (ref 200–950)

## 2018-07-16 LAB — HIGH SENSITIVITY CRP: hs-CRP: 6.8 mg/L — ABNORMAL HIGH

## 2018-07-16 LAB — HEMOGLOBIN A1C W/OUT EAG: Hgb A1c MFr Bld: 6 % of total Hgb — ABNORMAL HIGH (ref <5.7)

## 2018-07-16 LAB — CK: CK TOTAL: 92 U/L (ref 29–143)

## 2018-07-16 LAB — ANA: Anti Nuclear Antibody(ANA): NEGATIVE

## 2018-07-16 LAB — SEDIMENTATION RATE: SED RATE: 11 mm/h (ref 0–20)

## 2018-07-21 ENCOUNTER — Ambulatory Visit: Payer: Medicaid Other | Admitting: Osteopathic Medicine

## 2018-08-05 ENCOUNTER — Other Ambulatory Visit: Payer: Self-pay | Admitting: Osteopathic Medicine

## 2018-08-05 DIAGNOSIS — F419 Anxiety disorder, unspecified: Principal | ICD-10-CM

## 2018-08-05 DIAGNOSIS — F329 Major depressive disorder, single episode, unspecified: Secondary | ICD-10-CM

## 2018-08-05 DIAGNOSIS — F32A Depression, unspecified: Secondary | ICD-10-CM

## 2018-08-05 DIAGNOSIS — G44229 Chronic tension-type headache, not intractable: Secondary | ICD-10-CM

## 2018-08-12 ENCOUNTER — Encounter: Payer: Self-pay | Admitting: Osteopathic Medicine

## 2018-08-12 ENCOUNTER — Ambulatory Visit (INDEPENDENT_AMBULATORY_CARE_PROVIDER_SITE_OTHER): Payer: Medicaid Other | Admitting: Osteopathic Medicine

## 2018-08-12 VITALS — BP 129/81 | HR 81 | Temp 97.9°F | Wt 227.0 lb

## 2018-08-12 DIAGNOSIS — M25562 Pain in left knee: Secondary | ICD-10-CM

## 2018-08-12 DIAGNOSIS — F329 Major depressive disorder, single episode, unspecified: Secondary | ICD-10-CM

## 2018-08-12 DIAGNOSIS — M25561 Pain in right knee: Secondary | ICD-10-CM

## 2018-08-12 DIAGNOSIS — E119 Type 2 diabetes mellitus without complications: Secondary | ICD-10-CM | POA: Diagnosis not present

## 2018-08-12 DIAGNOSIS — G8929 Other chronic pain: Secondary | ICD-10-CM

## 2018-08-12 DIAGNOSIS — M25551 Pain in right hip: Secondary | ICD-10-CM | POA: Diagnosis not present

## 2018-08-12 DIAGNOSIS — F419 Anxiety disorder, unspecified: Secondary | ICD-10-CM

## 2018-08-12 DIAGNOSIS — F32A Depression, unspecified: Secondary | ICD-10-CM

## 2018-08-12 DIAGNOSIS — M25552 Pain in left hip: Secondary | ICD-10-CM

## 2018-08-12 LAB — POCT GLYCOSYLATED HEMOGLOBIN (HGB A1C): Hemoglobin A1C: 6.7 % — AB (ref 4.0–5.6)

## 2018-08-12 MED ORDER — METFORMIN HCL ER 500 MG PO TB24: 1000.0000 mg | ORAL_TABLET | Freq: Every day | ORAL | 1 refills | Status: DC

## 2018-08-12 NOTE — Patient Instructions (Addendum)
Plan:   We will get you on the schedule with one of the sports medicine specialists to evaluate your pain and hopefully help to where you will be able to exercise a bit more and this will help control the sugars.  We will increase the metformin to 1000 mg extended release daily, just take 2 of the 500 mg tablets  We will see what the weight/sugars are looking like in another 3 months, but please come see me sooner if you notice any concerning changes or worsening consider starting an additional medication to help with sugars and with weight.  Great job on cutting back on smoking, keep up the good work!

## 2018-08-12 NOTE — Progress Notes (Signed)
HPI: Rachael Fowler is a 32 y.o. female who  has a past medical history of Asthma.  she presents to [facility] today, 08/12/18,  for chief complaint of:  From last visit regarding aches/pains, anxiety/depression, smoking cessation  Aches/pains: Had some concerns for rheumatologic disorder, ANA test was repeated and was normal.  Sed rate, rheumatoid factor, CK were all normal.  CRP was slightly elevated.  Raise of both feet were negative for any concerning findings.  She still has some complaints of hip pain worse on the left, previous knee surgery   Diabetes: Patient requests A1c today, not quite yet due for this.  Last A1c 07/14/2018 6.0, today reading X.7.  She apparently checked her sugars yesterday and was in the 200s, this was not a fasting reading.  She was referred to endocrinology but has not followed up since 05/25/2018.  At that time, was recommended metformin extended release 500 mg once daily, prior to this, we had tried going up to immediate release metformin 1000 mg twice daily but she had a lot of GI issues with this dose.  Anxiety/depression: Has gained a few pounds but overall her moods are a lot better on the Prozac, still having some irritability/anxiety issues.    She is also cut back a good bit on smoking, tolerating the Chantix okay.      Past medical history, surgical history, and family history reviewed.  Current medication list and allergy/intolerance information reviewed.   (See remainder of HPI, ROS, Phys Exam below)  No results found.  Results for orders placed or performed in visit on 08/12/18 (from the past 72 hour(s))  POCT HgB A1C     Status: Abnormal   Collection Time: 08/12/18 10:47 AM  Result Value Ref Range   Hemoglobin A1C 6.7 (A) 4.0 - 5.6 %   HbA1c POC (<> result, manual entry)     HbA1c, POC (prediabetic range)     HbA1c, POC (controlled diabetic range)       ASSESSMENT/PLAN:   Type 2 diabetes mellitus  without complication, without long-term current use of insulin (HCC) - Plan: POCT HgB A1C  Pain of both hip joints  Chronic pain of both knees  Anxiety and depression - I think let us leave medications as is for now, will reevaluate weight gain but for now I think smoking cessation is more important   Meds ordered this encounter  Medications  . metFORMIN (GLUCOPHAGE XR) 500 MG 24 hr tablet    Sig: Take 2 tablets (1,000 mg total) by mouth daily with breakfast.    Dispense:  180 tablet    Refill:  1    Patient Instructions  Plan:   We will get you on the schedule with one of the sports medicine specialists to evaluate your pain and hopefully help to where you will be able to exercise a bit more and this will help control the sugars.  We will increase the metformin to 1000 mg extended release daily, just take 2 of the 500 mg tablets  We will see what the weight/sugars are looking like in another 3 months, but please come see me sooner if you notice any concerning changes or worsening consider starting an additional medication to help with sugars and with weight.  Great job on cutting back on smoking, keep up the good work!   Follow-up plan: Return for Sports medicine visit to evaluate hip/foot pain.  Aloe up for diabetes/A1c recheck with Dr. Loni Muse  3  mos.                    ############################################ ############################################ ############################################ ############################################    Outpatient Encounter Medications as of 08/12/2018  Medication Sig Note  . albuterol (PROVENTIL) (2.5 MG/3ML) 0.083% nebulizer solution Take 3 mLs (2.5 mg total) by nebulization every 4 (four) hours as needed for wheezing or shortness of breath (please include nebulizer machine, hoses, and mask if needed.).   [name] amitriptyline (ELAVIL) 50 MG tablet TAKE 1/2 TO 2 TABLETS AT BEDTIME START WITH LOWEST DOSE & INCREASE EVERY  2-3 DAYS   . Blood Glucose Monitoring Suppl (ACCU-CHEK AVIVA PLUS) w/Device KIT Accu-Chek Aviva Plus Meter  USE AS DIRECTED   . budesonide (PULMICORT) 0.25 MG/2ML nebulizer solution Take 2 mLs (0.25 mg total) by nebulization 2 (two) times daily.   . butalbital-acetaminophen-caffeine (FIORICET, ESGIC) 50-325-40 MG tablet Take 1 tablet by mouth every 4 (four) hours as needed for headache. MAX 2 per 24 hours, two days per week   . diphenhydramine-acetaminophen (TYLENOL PM EXTRA STRENGTH) 25-500 MG TABS tablet Take 1 tablet by mouth at bedtime as needed.   [name] FLUoxetine (PROZAC) 20 MG capsule Take 1 capsule (20 mg total) by mouth daily.   . fluticasone (FLONASE) 50 MCG/ACT nasal spray Place 2 sprays into both nostrils daily.   [name] glucose blood test strip Use to monitor blood glucose 6 time(s) daily in pregnancy   . labetalol (NORMODYNE) 200 MG tablet Take by mouth.   . loratadine (CLARITIN) 10 MG tablet Take 1 tablet (10 mg total) by mouth daily.   . Melatonin 10 MG CAPS Take by mouth.   . meloxicam (MOBIC) 15 MG tablet Take 1 tablet (15 mg total) by mouth daily. As needed for aches/pains   . metFORMIN (GLUCOPHAGE) 1000 MG tablet Take 1 tablet (1,000 mg total) by mouth 2 (two) times daily with a meal. 05/26/2018: As per pt, taking metformin er 500 mg once a day  . nicotine (NICODERM CQ - DOSED IN MG/24 HOURS) 14 mg/24hr patch Place onto the skin.   [name] NIFEdipine (PROCARDIA-XL/ADALAT-CC/NIFEDICAL-XL) 30 MG 24 hr tablet Take by mouth.   [name] omeprazole (PRILOSEC) 10 MG capsule Take 1 capsule (10 mg total) by mouth daily.   [name] spironolactone (ALDACTONE) 25 MG tablet Take 1 tablet (25 mg total) by mouth daily. Start with 12.5 mg (half tablet) for one week   . varenicline (CHANTIX CONTINUING MONTH PAK) 1 MG tablet Take 1 tablet (1 mg total) by mouth 2 (two) times daily.   . varenicline (CHANTIX STARTING MONTH PAK) 0.5 MG X 11 & 1 MG X 42 tablet Take one 0.5 mg tablet by mouth once daily for 3 days, then increase  to one 0.5 mg tablet twice daily for 4 days, then increase to one 1 mg tablet twice daily.    No facility-administered encounter medications on file as of 08/12/2018.    Allergies  Allergen Reactions  . Hydrocodone Itching and Nausea And Vomiting    Also nausea and vomiting   . Penicillins Itching, Nausea And Vomiting and Other (See Comments)  . Hydrocodone-Acetaminophen Itching and Nausea And Vomiting  . Penicillin G Itching      Review of Systems:  Constitutional: No recent illness  HEENT: No  headache, no vision change  Cardiac: No  chest pain, No  pressure, No palpitations  Respiratory:  No  shortness of breath. No  Cough  Gastrointestinal: No  abdominal pain, no change on bowel habits  Musculoskeletal: +  myalgia/arthralgia  Neurologic: No  weakness, No  Dizziness  Psychiatric: No  concerns with depression, +concerns with anxiety  Exam:  BP 129/81 (BP Location: Right Arm, Patient Position: Sitting, Cuff Size: Large)   Pulse 81   Temp 97.9 F (36.6 C) (Oral)   Wt 227 lb (103 kg)   BMI 38.96 kg/m   Constitutional: VS see above. General Appearance: alert, well-developed, well-nourished, NAD  Eyes: Normal lids and conjunctive, non-icteric sclera  Ears, Nose, Mouth, Throat: MMM, Normal external inspection ears/nares/mouth/lips/gums.  Neck: No masses, trachea midline.   Respiratory: Normal respiratory effort. no wheeze, no rhonchi, no rales  Cardiovascular: S1/S2 normal, no murmur, no rub/gallop auscultated. RRR.   Musculoskeletal: Gait normal. Symmetric and independent movement of all extremities  Neurological: Normal balance/coordination. No tremor.  Skin: warm, dry, intact.   Psychiatric: Normal judgment/insight. Normal mood and affect. Oriented x3.   Visit summary with medication list and pertinent instructions was printed for patient to review, advised to alert [place] if any changes needed. All questions at time of visit were answered - patient instructed  to contact office with any additional concerns. ER/RTC precautions were reviewed with the patient and understanding verbalized.   Follow-up plan: Return for Sports medicine visit to evaluate hip/foot pain.  Aloe up for diabetes/A1c recheck with Dr. Loni Muse  3 mos.    Please note: voice recognition software was used to produce this document, and typos may escape review. Please contact Dr. Sheppard Coil for any needed clarifications.

## 2018-08-17 ENCOUNTER — Encounter: Payer: Self-pay | Admitting: Sports Medicine

## 2018-08-17 ENCOUNTER — Ambulatory Visit (INDEPENDENT_AMBULATORY_CARE_PROVIDER_SITE_OTHER): Payer: Medicaid Other | Admitting: Sports Medicine

## 2018-08-17 ENCOUNTER — Ambulatory Visit (INDEPENDENT_AMBULATORY_CARE_PROVIDER_SITE_OTHER): Payer: Medicaid Other

## 2018-08-17 DIAGNOSIS — M545 Low back pain, unspecified: Secondary | ICD-10-CM | POA: Insufficient documentation

## 2018-08-17 DIAGNOSIS — M222X1 Patellofemoral disorders, right knee: Secondary | ICD-10-CM

## 2018-08-17 DIAGNOSIS — M222X2 Patellofemoral disorders, left knee: Secondary | ICD-10-CM | POA: Diagnosis not present

## 2018-08-17 DIAGNOSIS — G8929 Other chronic pain: Secondary | ICD-10-CM

## 2018-08-17 DIAGNOSIS — M47816 Spondylosis without myelopathy or radiculopathy, lumbar region: Secondary | ICD-10-CM | POA: Insufficient documentation

## 2018-08-17 DIAGNOSIS — M25561 Pain in right knee: Secondary | ICD-10-CM | POA: Diagnosis not present

## 2018-08-17 DIAGNOSIS — M25562 Pain in left knee: Secondary | ICD-10-CM

## 2018-08-17 HISTORY — DX: Patellofemoral disorders, right knee: M22.2X1

## 2018-08-17 HISTORY — DX: Spondylosis without myelopathy or radiculopathy, lumbar region: M47.816

## 2018-08-17 MED ORDER — PREDNISONE 50 MG PO TABS: ORAL_TABLET | ORAL | 0 refills | Status: DC

## 2018-08-17 NOTE — Assessment & Plan Note (Signed)
With a history of what sounds to be a lateral release on the left. Restarting aggressive formal PT. Bilateral x-rays. Injection if no better at the follow-up visit. Since she has had a lateral release on the left, if she continues to have pain she will probably need a [place] slide.

## 2018-08-17 NOTE — Assessment & Plan Note (Addendum)
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 X-rays of the lumbar spine and SI joints. Aggressive formal physical therapy. 5 days of prednisone. If they declare themselves as SI joint at the 4-week follow-up I will do the injection.  Her orthopedic surgeon told her that she had rheumatoid arthritis, negative rheumatoid factor, adding CCP and HLA-B27 testing.

## 2018-08-17 NOTE — Progress Notes (Signed)
Subjective:    I'm seeing this patient as a consultation for: Dr. Emeterio Reeve  CC: Back pain, knee pain  HPI: Back pain: Present for decades, localized in both sacroiliac joints and worse with standing, moderate, persistent, radiation to the buttock and thighs but not past the knee.  No bowel or bladder dysfunction, saddle numbness, no constitutional symptoms, no trauma.  Knee pain: Known bilateral patellofemoral syndrome, she is post lateral release on the left side.  Pain is under the kneecap, moderate, persistent without radiation.  I reviewed the past medical history, family history, social history, surgical history, and allergies today and no changes were needed.  Please see the problem list section below in epic for further details.  Past Medical History: Past Medical History:  Diagnosis Date  . Asthma    Past Surgical History: Past Surgical History:  Procedure Laterality Date  . KNEE SURGERY  04/20/2006   Social History: Social History   Socioeconomic History  . Marital status: Single    Spouse name: Not on file  . Number of children: Not on file  . Years of education: Not on file  . Highest education level: Not on file  Occupational History  . Not on file  Social Needs  . Financial resource strain: Not on file  . Food insecurity:    Worry: Not on file    Inability: Not on file  . Transportation needs:    Medical: Not on file    Non-medical: Not on file  Tobacco Use  . Smoking status: Current Every Day Smoker    Packs/day: 0.50  . Smokeless tobacco: Never Used  Substance and Sexual Activity  . Alcohol use: Not on file  . Drug use: Not on file  . Sexual activity: Yes  Lifestyle  . Physical activity:    Days per week: Not on file    Minutes per session: Not on file  . Stress: Not on file  Relationships  . Social connections:    Talks on phone: Not on file    Gets together: Not on file    Attends religious service: Not on file    Active member  of club or organization: Not on file    Attends meetings of clubs or organizations: Not on file    Relationship status: Not on file  Other Topics Concern  . Not on file  Social History Narrative  . Not on file   Family History: Family History  Problem Relation Age of Onset  . Depression Mother   . Diabetes Mother   . Hyperlipidemia Mother   . Hypertension Mother   . Hyperlipidemia Father   . Hypertension Father   . Cancer Maternal Uncle   . Diabetes Maternal Uncle   . Cancer Maternal Grandmother   . Hypertension Maternal Grandmother   . Hyperlipidemia Maternal Grandmother   . Hyperlipidemia Paternal Grandmother    Allergies: Allergies  Allergen Reactions  . Hydrocodone Itching and Nausea And Vomiting    Also nausea and vomiting   . Penicillins Itching, Nausea And Vomiting and Other (See Comments)  . Hydrocodone-Acetaminophen Itching and Nausea And Vomiting  . Penicillin G Itching   Medications: See med rec.  Review of Systems: No headache, visual changes, nausea, vomiting, diarrhea, constipation, dizziness, abdominal pain, skin rash, fevers, chills, night sweats, weight loss, swollen lymph nodes, body aches, joint swelling, muscle aches, chest pain, shortness of breath, mood changes, visual or auditory hallucinations.   Objective:   General: Well Developed, well  nourished, and in no acute distress.  Neuro:  Extra-ocular muscles intact, able to move all 4 extremities, sensation grossly intact.  Deep tendon reflexes tested were normal. Psych: Alert and oriented, mood congruent with affect. ENT:  Ears and nose appear unremarkable.  Hearing grossly normal. Neck: Unremarkable overall appearance, trachea midline.  No visible thyroid enlargement. Eyes: Conjunctivae and lids appear unremarkable.  Pupils equal and round. Skin: Warm and dry, no rashes noted.  Cardiovascular: Pulses palpable, no extremity edema. Back Exam:  Inspection: Unremarkable  Motion: Flexion 45 deg,  Extension 45 deg, Side Bending to 45 deg bilaterally,  Rotation to 45 deg bilaterally  SLR laying: Negative  XSLR laying: Negative  Palpable tenderness: Left and right sacroiliac joints. FABER: negative. Sensory change: Gross sensation intact to all lumbar and sacral dermatomes.  Reflexes: 2+ at both patellar tendons, 2+ at achilles tendons, Babinski's downgoing.  Strength at foot  Plantar-flexion: 5/5 Dorsi-flexion: 5/5 Eversion: 5/5 Inversion: 5/5  Leg strength  Quad: 5/5 Hamstring: 5/5 Hip flexor: 5/5 Hip abductors: 5/5  Gait unremarkable. Bilateral knees: Normal to inspection with no erythema or effusion or obvious bony abnormalities. Palpation normal with no warmth or joint line tenderness or patellar tenderness or condyle tenderness. ROM normal in flexion and extension and lower leg rotation. Ligaments with solid consistent endpoints including ACL, PCL, LCL, MCL. Negative Mcmurray's and provocative meniscal tests. Non painful patellar compression. Patellar and quadriceps tendons unremarkable. Hamstring and quadriceps strength is normal.  Impression and Recommendations:   This case required medical decision making of moderate complexity.  Chronic bilateral low back pain without sciatica Likely bilateral sacroiliac joint pathology. X-rays of the lumbar spine and SI joints. Aggressive formal physical therapy. 5 days of prednisone. If they declare themselves as SI joint at the 4-week follow-up I will do the injection.  Her orthopedic surgeon told her that she had rheumatoid arthritis, negative rheumatoid factor, adding CCP and HLA-B27 testing.  Patellofemoral syndrome, bilateral With a history of what sounds to be a lateral release on the left. Restarting aggressive formal PT. Bilateral x-rays. Injection if no better at the follow-up visit. Since she has had a lateral release on the left, if she continues to have pain she will probably need a Fulkerson  slide. ___________________________________________ Gwen Her. Dianah Field, M.D., ABFM., CAQSM. Primary Care and Sports Medicine [facility]  Adjunct Professor of [employer]

## 2018-08-30 ENCOUNTER — Ambulatory Visit: Payer: Medicaid Other | Attending: Sports Medicine | Admitting: Physical Therapy

## 2018-08-30 ENCOUNTER — Other Ambulatory Visit: Payer: Self-pay

## 2018-08-30 DIAGNOSIS — M25552 Pain in left hip: Secondary | ICD-10-CM | POA: Diagnosis present

## 2018-08-30 DIAGNOSIS — M25561 Pain in right knee: Secondary | ICD-10-CM | POA: Diagnosis present

## 2018-08-30 DIAGNOSIS — M25562 Pain in left knee: Secondary | ICD-10-CM | POA: Insufficient documentation

## 2018-08-30 DIAGNOSIS — M25551 Pain in right hip: Secondary | ICD-10-CM | POA: Insufficient documentation

## 2018-08-30 DIAGNOSIS — R262 Difficulty in walking, not elsewhere classified: Secondary | ICD-10-CM | POA: Insufficient documentation

## 2018-08-30 DIAGNOSIS — G8929 Other chronic pain: Secondary | ICD-10-CM | POA: Insufficient documentation

## 2018-08-30 DIAGNOSIS — M6281 Muscle weakness (generalized): Secondary | ICD-10-CM | POA: Diagnosis present

## 2018-08-30 DIAGNOSIS — M545 Low back pain, unspecified: Secondary | ICD-10-CM

## 2018-08-30 NOTE — Patient Instructions (Addendum)

## 2018-08-30 NOTE — Therapy (Addendum)
[facility] [address]  [address] [place], [place], [address] Phone: [phone]   Fax:  [phone]  Physical Therapy Evaluation  Patient Details  Name: Rachael Fowler MRN: [id number] Date of Birth: 1986/02/02 Referring Provider (PT): Rodney Langton, MD   Encounter Date: 08/30/2018  PT End of Session - 08/30/18 1445    Visit Number  1    Number of Visits  16    Date for PT Re-Evaluation  11/01/18    Authorization Type  Medicaid    PT Start Time  1445    PT Stop Time  1529    PT Time Calculation (min)  44 min    Activity Tolerance  Patient tolerated treatment well    Behavior During Therapy  [facility] for tasks assessed/performed       Past Medical History:  Diagnosis Date  . Asthma     Past Surgical History:  Procedure Laterality Date  . KNEE SURGERY  04/20/2006    There were no vitals filed for this visit.   Subjective Assessment - 08/30/18 1450    Subjective  Pt reports trouble with knees and back since she was 32 y/o, with hips starting to bother her later on. Pain chronic in nature w/o acute exacerbation.     Pertinent History  chronic B patellofemoral pain syndrome s/p lateral release on the left & B sacroiliac joint dysfuction    Limitations  Standing;Walking;House hold activities    How long can you sit comfortably?  requires frequent change of position    How long can you stand comfortably?  <5 minutes    How long can you walk comfortably?  10-15 minutes    Diagnostic tests  X-rays knee, sacrum & lumbar spine 08/17/18: WNL except questionable degenerative facet spurring at L4-5 and L5-S1.    Patient Stated Goals  "to not be in pain"    Currently in Pain?  Yes    Pain Score  4     Pain Location  Back    Pain Orientation  Lower;Left;Right    Pain Descriptors / Indicators  Sharp;Cramping    Pain Type  Chronic pain    Pain Radiating Towards  into B buttocks and down posterior/lateral thighs    Pain Onset   Other (comment)   since she was 32 y/o   Pain Frequency  Constant    Aggravating Factors   prolonged standing & walking, sitting "indian style"    Pain Relieving Factors  sit, rest    Effect of Pain on Daily Activities  limits standing tolerance for household chores    Multiple Pain Sites  Yes    Pain Score  2    Pain Location  Knee    Pain Orientation  Left   only L knee today, but usually has pain in R knee too   Pain Descriptors / Indicators  Sharp;Stabbing    Pain Type  Chronic pain    Pain Radiating Towards  n/a    Pain Onset  Other (comment)   since she was 32 y/o   Pain Frequency  Constant    Aggravating Factors   walking    Pain Relieving Factors  rest, sitting down    Effect of Pain on Daily Activities  limits ability to exercise    Pain Score  2    Pain Location  Hip    Pain Orientation  Left;Right    Pain Descriptors / Indicators  Sharp;Cramping  Pain Type  Chronic pain    Pain Radiating Towards  deep into hip joint    Pain Onset  Other (comment)   ~5-6 yrs   Pain Frequency  Intermittent    Aggravating Factors   squatting, sex, overuse    Pain Relieving Factors  straighten out her legs    Effect of Pain on Daily Activities  limits walking tolerance         [facility] PT Assessment - 08/30/18 1445      Assessment   Medical Diagnosis  Chronic bilateral LBP/SIJ pain, B hip & knee pain    Referring Provider (PT)  Rodney Langton, MD    Onset Date/Surgical Date  --   back & B knees ~13 yrs, B hips ~5-6 yrs   Next MD Visit  09/28/18    Prior Therapy  none      Balance Screen   Has the patient fallen in the past 6 months  No    Has the patient had a decrease in activity level because of a fear of falling?   No    Is the patient reluctant to leave their home because of a fear of falling?   No      Home Public house manager residence    Living Arrangements  Spouse/significant other    Type of Home  --   Trailer   Home Access  Stairs to  enter    Entrance Stairs-Number of Steps  4    Entrance Stairs-Rails  Right;Left;Can reach both    Home Layout  One level      Prior Function   Level of Independence  Independent    Vocation  Unemployed    Leisure  Caring for 17 month old      Cognition   Overall Cognitive Status  Within Functional Limits for tasks assessed      Posture/Postural Control   Posture/Postural Control  Postural limitations    Postural Limitations  Anterior pelvic tilt;Increased lumbar lordosis      ROM / Strength   AROM / PROM / Strength  AROM;Strength      AROM   Overall AROM   Within functional limits for tasks performed    AROM Assessment Site  Lumbar;Hip;Knee      Strength   Strength Assessment Site  Hip;Knee    Right/Left Hip  Right;Left    Right Hip Flexion  4/5    Right Hip Extension  3-/5    Right Hip External Rotation   4/5    Right Hip Internal Rotation  4+/5    Right Hip ABduction  4+/5    Right Hip ADduction  4/5    Left Hip Flexion  4/5    Left Hip Extension  3-/5    Left Hip External Rotation  4/5    Left Hip Internal Rotation  4/5    Left Hip ABduction  4+/5    Left Hip ADduction  4-/5    Right/Left Knee  Right;Left    Right Knee Flexion  4+/5    Right Knee Extension  4+/5    Left Knee Flexion  4+/5    Left Knee Extension  4+/5      Flexibility   Soft Tissue Assessment /Muscle Length  yes    Hamstrings  mild tight B    Quadriceps  mod/severe tight B hip flexors    ITB  mild/mod tight B    Piriformis  WNL  Palpation   SI assessment   alignment WNL    Palpation comment  pt denies ttp over lumbar paraspinals, SIJ, glutes or piriformis                Objective measurements completed on examination: See above findings.      [facility] Adult PT Treatment/Exercise - 08/30/18 1445      Exercises   Exercises  Lumbar      Lumbar Exercises: Stretches   Hip Flexor Stretch  Right;Left;30 seconds;2 reps   each   Hip Flexor Stretch Limitations  mod thomas with  strap & 1/2 kneel lunge      Lumbar Exercises: Supine   Pelvic Tilt  10 reps;5 seconds    Bridge  10 reps;5 seconds             PT Education - 08/30/18 1529    Education Details  PT eval findings, anticipated POC, initial HEP & posture/body mechanics handout    Person(s) Educated  Patient    Methods  Explanation;Demonstration;Handout    Comprehension  Verbalized understanding;Returned demonstration;Need further instruction       PT Short Term Goals - 08/30/18 1529      PT SHORT TERM GOAL #1   Title  Independent with initial HEP    Status  New    Target Date  09/20/18      PT SHORT TERM GOAL #2   Title  Patient to demonstrate appropriate posture and body mechanics needed for daily activities with <50% cues to minimize low back and LE joint strain    Status  New    Target Date  09/20/18      PT SHORT TERM GOAL #3   Title  Patient will report ability to stand for 10-15 minutes w/o increase in low back, hip or knee pain to improve tolerance for kitchen chores    Status  New    Target Date  09/20/18        PT Long Term Goals - 08/30/18 1529      PT LONG TERM GOAL #1   Title  Independent with ongoing/advanced HEP     Status  New    Target Date  11/01/18      PT LONG TERM GOAL #2   Title  Patient to routinely demonstrate appropriate posture and body mechanics needed for daily activities with <10% cues to minimize low back and LE joint strain    Status  New    Target Date  11/01/18      PT LONG TERM GOAL #3   Title  B hip strength >/= 4+/5 for improved proximal stability    Status  New    Target Date  11/01/18      PT LONG TERM GOAL #4   Title  Patient will report ability to stand and/or walk for >/= 20-30 minutes w/o limitation due to low back, hip or knee pain    Status  New    Target Date  11/01/18      PT LONG TERM GOAL #5   Title  Patient to report ability to perform ADLs and household chores/tasks without increased low back, hip or knee pain    Status   New    Target Date  11/01/18             Plan - 08/30/18 1529    Clinical Impression Statement  Gracelyn is a 32 y/o F referred to OP PT for chronic LBP w/o sciatica, B  SIJ dysfunction & B patellofemoral pain syndrome. Pt reports remote onset of back and knee pain 13 yrs ago at age of 28, with onset of B hip pain ~5-6 yrs ago but this is the first time she has been to PT for any of these issues. Lumbar and LE ROM WFL/WNL on eval, but moderate to severe hip flexor tightness contributing to anterior pelvic tilt and increased lumbar lordosis although SIJ alignment appears WNL. Mild to moderate proximal LE and core weakness evident. Pt reports pain limits standing and walking tolerance limiting her ability to complete household chores or exercise. Paula will benefit from skilled PT intervention to address the above listed deficits and to allow for improved functional mobility and activity tolerance with decreased pain.      History and Personal Factors relevant to plan of care:  obesity    Clinical Presentation  Evolving    Clinical Presentation due to:  multiple active problems with evolving characteristics    Clinical Decision Making  Moderate    Rehab Potential  Good    PT Frequency  2x / week   1x/wk for initial 3 visit Medicaid authorization, then 1-2x/wk as indicated    PT Duration  Other (comment)   9 weeks   PT Treatment/Interventions  Patient/family education;ADLs/Self Care Home Management;Neuromuscular re-education;Therapeutic exercise;Therapeutic activities;Functional mobility training;Gait training;Manual techniques;Dry needling;Passive range of motion;Taping;Traction;Ultrasound;Electrical Stimulation;Iontophoresis 4mg /ml Dexamethasone;Moist Heat;Cryotherapy;Vasopneumatic Device    PT Next Visit Plan  review initial HEP & posture/body mechanics education    Consulted and Agree with Plan of Care  Patient       Patient will benefit from skilled therapeutic intervention in order to  improve the following deficits and impairments:  Pain, Postural dysfunction, Improper body mechanics, Increased muscle spasms, Impaired flexibility, Decreased strength, Difficulty walking, Decreased activity tolerance  Visit Diagnosis: Chronic bilateral low back pain without sciatica - Plan: PT plan of care cert/re-cert  Chronic pain of left knee - Plan: PT plan of care cert/re-cert  Chronic pain of right knee - Plan: PT plan of care cert/re-cert  Pain in left hip - Plan: PT plan of care cert/re-cert  Pain in right hip - Plan: PT plan of care cert/re-cert  Muscle weakness (generalized) - Plan: PT plan of care cert/re-cert  Difficulty in walking, not elsewhere classified - Plan: PT plan of care cert/re-cert     Problem List Patient Active Problem List   Diagnosis Date Noted  . Patellofemoral syndrome, bilateral 08/17/2018  . Chronic bilateral low back pain without sciatica 08/17/2018  . Pain of left heel 07/14/2018  . Pain of right heel 07/14/2018  . Leukocytosis 07/14/2018  . Hirsutism 05/26/2018  . Abnormal WBC count 05/26/2018  . Psychophysiological insomnia 05/26/2018  . Anxiety and depression 05/26/2018  . Chronic tension-type headache, not intractable 05/26/2018  . Moderate persistent asthma with exacerbation 02/05/2018  . Type 2 diabetes mellitus without complication, without long-term current use of insulin (HCC) 08/19/2017  . Pregnancy 08/19/2017  . OTHER ACUTE REACTIONS TO STRESS 12/04/2009    Marry Guan, PT, MPT 08/30/2018, 8:13 PM  [facility] [address]  [address] [place], [place], [address] Phone: [phone]   Fax:  [phone]  Name: Kathyrn Warmuth MRN: [id number] Date of Birth: 1986/10/24

## 2018-09-02 LAB — RHEUMATOID ARTHRITIS DIAGNOSTIC PANEL, COMPREHENSIVE
Cyclic Citrullin Peptide Ab: <16 Units (ref <20)
Rheumatoid Factor (IgA): <5 U (ref <=6)
Rheumatoid Factor (IgG): <5 U (ref <=6)
Rheumatoid Factor (IgM): <5 U (ref <=6)
SSA (Ro) (ENA) Antibody, IgG: <1 AI
SSB (La) (ENA) Antibody, IgG: <1 AI

## 2018-09-02 LAB — HLA-B27 ANTIGEN: HLA-B27 Antigen: NEGATIVE

## 2018-09-02 LAB — URIC ACID: Uric Acid, Serum: 5.3 mg/dL (ref 2.5–7.0)

## 2018-09-06 ENCOUNTER — Ambulatory Visit: Payer: Medicaid Other | Admitting: Physical Therapy

## 2018-09-07 ENCOUNTER — Ambulatory Visit: Payer: Medicaid Other

## 2018-09-07 DIAGNOSIS — M545 Low back pain, unspecified: Secondary | ICD-10-CM

## 2018-09-07 DIAGNOSIS — M25551 Pain in right hip: Secondary | ICD-10-CM

## 2018-09-07 DIAGNOSIS — R262 Difficulty in walking, not elsewhere classified: Secondary | ICD-10-CM

## 2018-09-07 DIAGNOSIS — M25561 Pain in right knee: Secondary | ICD-10-CM

## 2018-09-07 DIAGNOSIS — M25562 Pain in left knee: Secondary | ICD-10-CM

## 2018-09-07 DIAGNOSIS — M25552 Pain in left hip: Secondary | ICD-10-CM

## 2018-09-07 DIAGNOSIS — G8929 Other chronic pain: Secondary | ICD-10-CM

## 2018-09-07 DIAGNOSIS — M6281 Muscle weakness (generalized): Secondary | ICD-10-CM

## 2018-09-07 NOTE — Patient Instructions (Signed)

## 2018-09-07 NOTE — Therapy (Signed)
[facility] [place] [address]  [address] [place], [place], [address] Phone: [phone]   Fax:  [phone]  Physical Therapy Treatment  Patient Details  Name: Rachael Fowler MRN: [id number] Date of Birth: 01/25/1986 Referring Provider (PT): Rodney Langtonhomas Thekkekandam, MD   Encounter Date: 09/07/2018  PT End of Session - 09/07/18 1325    Visit Number  2    Number of Visits  16    Date for PT Re-Evaluation  11/01/18    Authorization Type  Medicaid    Authorization Time Period  09/06/18 - 09/26/18    Authorization - Visit Number  1    Authorization - Number of Visits  3    PT Start Time  1315    PT Stop Time  1355    PT Time Calculation (min)  40 min    Activity Tolerance  Patient tolerated treatment well    Behavior During Therapy  [facility] for tasks assessed/performed       Past Medical History:  Diagnosis Date  . Asthma     Past Surgical History:  Procedure Laterality Date  . KNEE SURGERY  04/20/2006    There were no vitals filed for this visit.  Subjective Assessment - 09/07/18 1316    Subjective  Pt. reporting B knees are giving her most pain today and wishes to focus on knees today.      Pertinent History  chronic B patellofemoral pain syndrome s/p lateral release on the left & B sacroiliac joint dysfuction    Diagnostic tests  X-rays knee, sacrum & lumbar spine 08/17/18: WNL except questionable degenerative facet spurring at L4-5 and L5-S1.    Patient Stated Goals  "to not be in pain"    Currently in Pain?  Yes    Pain Score  0-No pain   Pain rising to 4/10 at worst    Pain Location  Back    Pain Orientation  Lower;Left;Right    Pain Descriptors / Indicators  Sharp;Cramping    Pain Type  Chronic pain    Pain Radiating Towards  into R posterior/lateral thighs    Pain Frequency  Constant    Aggravating Factors   Prolonged standing, carrying something, repetitive bending over     Pain Relieving Factors  sit, rest     Multiple Pain Sites  Yes    Pain Score  4    Pain Location  Knee    Pain Orientation  Right;Left   R+ L   Pain Descriptors / Indicators  Sharp;Stabbing    Pain Type  Chronic pain    Pain Frequency  Constant    Aggravating Factors   walking    Pain Relieving Factors  rest, sitting down                       [facility] - 09/07/18 1333      Self-Care   Self-Care  --   Instructed pt. in importance of HEP adherence      Lumbar Exercises: Stretches   Hip Flexor Stretch  Right;Left;30 seconds;2 reps    Hip Flexor Stretch Limitations  mod thoms with strap preferred over kneeling       Lumbar Exercises: Aerobic   Nustep  Lvl 1, 6 min       Lumbar Exercises: Supine   Pelvic Tilt  10 reps;5 seconds    Pelvic Tilt Limitations  Cues provided for proper motion  Clam  10 reps;3 seconds    Clam Limitations  Alternating hip abd/ER with red TB at knees     Bridge  10 reps;5 seconds    Straight Leg Raise  10 reps;3 seconds    Straight Leg Raises Limitations  B      Lumbar Exercises: Sidelying   Clam  Right;Left;10 reps    Clam Limitations  no resistance                PT Short Term Goals - 09/07/18 1329      PT SHORT TERM GOAL #1   Title  Independent with initial HEP    Status  On-going      PT SHORT TERM GOAL #2   Title  Patient to demonstrate appropriate posture and body mechanics needed for daily activities with <50% cues to minimize low back and LE joint strain    Status  On-going      PT SHORT TERM GOAL #3   Title  Patient will report ability to stand for 10-15 minutes w/o increase in low back, hip or knee pain to improve tolerance for kitchen chores    Status  On-going        PT Long Term Goals - 09/07/18 1330      PT LONG TERM GOAL #1   Title  Independent with ongoing/advanced HEP     Status  On-going      PT LONG TERM GOAL #2   Title  Patient to routinely demonstrate appropriate posture and body mechanics needed  for daily activities with <10% cues to minimize low back and LE joint strain    Status  On-going      PT LONG TERM GOAL #3   Title  B hip strength >/= 4+/5 for improved proximal stability    Status  On-going      PT LONG TERM GOAL #4   Title  Patient will report ability to stand and/or walk for >/= 20-30 minutes w/o limitation due to low back, hip or knee pain    Status  On-going      PT LONG TERM GOAL #5   Title  Patient to report ability to perform ADLs and household chores/tasks without increased low back, hip or knee pain    Status  On-going            Plan - 09/07/18 1837    Clinical Impression Statement  Pt. admitting to poor HEP adherence today and strongly encouraged pt. to adherence to full HEP for full benefit from therapy.  Pt. unable to tolerate half-kneeling hip flexion stretch today due to knee pain thus instructed pt. to stop performing this and instead perform supine Mod Thomas hip flexion stretch.  Pt. tolerated lumbopelvic and LE strengthening activities well today however did require cueing to avoid painful end ranges.  Will plan to monitor HEP adherence and continue to progress toward goals in coming visits.      PT Treatment/Interventions  Patient/family education;ADLs/Self Care Home Management;Neuromuscular re-education;Therapeutic exercise;Therapeutic activities;Functional mobility training;Gait training;Manual techniques;Dry needling;Passive range of motion;Taping;Traction;Ultrasound;Electrical Stimulation;Iontophoresis 4mg /ml Dexamethasone;Moist Heat;Cryotherapy;Vasopneumatic Device    Consulted and Agree with Plan of Care  Patient       Patient will benefit from skilled therapeutic intervention in order to improve the following deficits and impairments:  Pain, Postural dysfunction, Improper body mechanics, Increased muscle spasms, Impaired flexibility, Decreased strength, Difficulty walking, Decreased activity tolerance  Visit Diagnosis: Chronic bilateral low  back pain without sciatica  Chronic pain of  left knee  Chronic pain of right knee  Pain in left hip  Pain in right hip  Muscle weakness (generalized)  Difficulty in walking, not elsewhere classified     Problem List Patient Active Problem List   Diagnosis Date Noted  . Patellofemoral syndrome, bilateral 08/17/2018  . Chronic bilateral low back pain without sciatica 08/17/2018  . Pain of left heel 07/14/2018  . Pain of right heel 07/14/2018  . Leukocytosis 07/14/2018  . Hirsutism 05/26/2018  . Abnormal WBC count 05/26/2018  . Psychophysiological insomnia 05/26/2018  . Anxiety and depression 05/26/2018  . Chronic tension-type headache, not intractable 05/26/2018  . Moderate persistent asthma with exacerbation 02/05/2018  . Type 2 diabetes mellitus without complication, without long-term current use of insulin (HCC) 08/19/2017  . Pregnancy 08/19/2017  . OTHER ACUTE REACTIONS TO STRESS 12/04/2009    Kermit Balo, PTA 09/08/18 8:51 AM   [facility] [address]  [address] [place], [place], [address] Phone: [phone]   Fax:  [phone]  Name: Rachael Fowler MRN: [id number] Date of Birth: 1986-09-16

## 2018-09-13 ENCOUNTER — Encounter: Payer: Self-pay | Admitting: Physical Therapy

## 2018-09-13 ENCOUNTER — Ambulatory Visit: Payer: Medicaid Other | Admitting: Physical Therapy

## 2018-09-13 DIAGNOSIS — M25561 Pain in right knee: Secondary | ICD-10-CM

## 2018-09-13 DIAGNOSIS — R262 Difficulty in walking, not elsewhere classified: Secondary | ICD-10-CM

## 2018-09-13 DIAGNOSIS — M545 Low back pain, unspecified: Secondary | ICD-10-CM

## 2018-09-13 DIAGNOSIS — G8929 Other chronic pain: Secondary | ICD-10-CM

## 2018-09-13 DIAGNOSIS — M25552 Pain in left hip: Secondary | ICD-10-CM

## 2018-09-13 DIAGNOSIS — M25562 Pain in left knee: Secondary | ICD-10-CM

## 2018-09-13 DIAGNOSIS — M25551 Pain in right hip: Secondary | ICD-10-CM

## 2018-09-13 DIAGNOSIS — M6281 Muscle weakness (generalized): Secondary | ICD-10-CM

## 2018-09-13 NOTE — Therapy (Addendum)
[facility] [place] [address]  [place], [place], [address] Phone: [phone]   Fax:  [phone]  Physical Therapy Treatment / Discharge Summary  Patient Details  Name: Rachael Fowler MRN: [id number] Date of Birth: May 14, 1986 Referring Provider (PT): Aundria Mems, MD   Encounter Date: 09/13/2018  PT End of Session - 09/13/18 1449    Visit Number  3    Number of Visits  16    Date for PT Re-Evaluation  11/01/18    Authorization Type  Medicaid    Authorization Time Period  09/06/18 - 09/26/18    Authorization - Visit Number  2    Authorization - Number of Visits  3    PT Start Time  1761    PT Stop Time  1531    PT Time Calculation (min)  42 min    Activity Tolerance  Patient tolerated treatment well    Behavior During Therapy  [facility] for tasks assessed/performed       Past Medical History:  Diagnosis Date  . Asthma     Past Surgical History:  Procedure Laterality Date  . KNEE SURGERY  04/20/2006    There were no vitals filed for this visit.  Subjective Assessment - 09/13/18 1450    Subjective  Pt denies any issues or concerns with HEP. Denies questions about posture & body mechanics education - has tried standing with foot on cabinet lip while doing dishes, but states "it didn't really help."    Pertinent History  chronic B patellofemoral pain syndrome s/p lateral release on the left & B sacroiliac joint dysfuction    Diagnostic tests  X-rays knee, sacrum & lumbar spine 08/17/18: WNL except questionable degenerative facet spurring at L4-5 and L5-S1.    Patient Stated Goals  "to not be in pain"    Currently in Pain?  Yes    Pain Score  3     Pain Location  Back    Pain Orientation  Lower;Right    Pain Descriptors / Indicators  Sharp;Cramping    Pain Type  Chronic pain    Pain Score  5    Pain Location  Knee    Pain Orientation  Right    Pain Descriptors / Indicators  Sharp    Pain Type  Chronic pain                        [facility] Adult PT Treatment/Exercise - 09/13/18 1449      Exercises   Exercises  Lumbar;Knee/Hip      Lumbar Exercises: Stretches   Double Knee to Chest Stretch  10 seconds;5 reps    Lower Trunk Rotation  10 seconds;5 reps      Lumbar Exercises: Aerobic   Nustep  L2 x 6 min      Lumbar Exercises: Standing   Heel Raises  10 reps;3 seconds      Lumbar Exercises: Supine   Bridge  10 reps;5 seconds    Bridge Limitations  + hip ABD isometric with red TB      Lumbar Exercises: Sidelying   Clam  Right;Left;10 reps;3 seconds    Clam Limitations  red TB      Knee/Hip Exercises: Standing   Other Standing Knee Exercises  B side stepping with red TB at knees 2 x 79f      Knee/Hip Exercises: Seated   Long Arc Quad  Right;Left;10 reps;Weights;Strengthening    Long  Arc Quad Weight  2 lbs.    Long Arc Quad Limitations  + hip adduction ball squeeze    Hamstring Curl  Right;Left;10 reps;Strengthening    Hamstring Limitations  red TB             PT Education - 09/13/18 1530    Education Details  HEP update    Person(s) Educated  Patient    Methods  Explanation;Demonstration;Handout    Comprehension  Verbalized understanding;Returned demonstration       PT Short Term Goals - 09/07/18 1329      PT SHORT TERM GOAL #1   Title  Independent with initial HEP    Status  On-going      PT SHORT TERM GOAL #2   Title  Patient to demonstrate appropriate posture and body mechanics needed for daily activities with <50% cues to minimize low back and LE joint strain    Status  On-going      PT SHORT TERM GOAL #3   Title  Patient will report ability to stand for 10-15 minutes w/o increase in low back, hip or knee pain to improve tolerance for kitchen chores    Status  On-going        PT Long Term Goals - 09/07/18 1330      PT LONG TERM GOAL #1   Title  Independent with ongoing/advanced HEP     Status  On-going      PT LONG TERM GOAL #2   Title   Patient to routinely demonstrate appropriate posture and body mechanics needed for daily activities with <10% cues to minimize low back and LE joint strain    Status  On-going      PT LONG TERM GOAL #3   Title  B hip strength >/= 4+/5 for improved proximal stability    Status  On-going      PT LONG TERM GOAL #4   Title  Patient will report ability to stand and/or walk for >/= 20-30 minutes w/o limitation due to low back, hip or knee pain    Status  On-going      PT LONG TERM GOAL #5   Title  Patient to report ability to perform ADLs and household chores/tasks without increased low back, hip or knee pain    Status  On-going            Plan - 09/13/18 1453    Clinical Impression Statement  Rachael Fowler reporting no benefit from postural modifications suggested for low back pain. Denies need for further review of initial HEP, therefore session focusing on progression of back and knee exercises with emphasis on lumbopelvic flexibility and strengthening for proximal stability. HEP updated to include some of new exercises.      Rehab Potential  Good    PT Treatment/Interventions  Patient/family education;ADLs/Self Care Home Management;Neuromuscular re-education;Therapeutic exercise;Therapeutic activities;Functional mobility training;Gait training;Manual techniques;Dry needling;Passive range of motion;Taping;Traction;Ultrasound;Electrical Stimulation;Iontophoresis 58m/ml Dexamethasone;Moist Heat;Cryotherapy;Vasopneumatic Device    PT Next Visit Plan  review updated HEP; Medicaid reauthorization as indicated    Consulted and Agree with Plan of Care  Patient       Patient will benefit from skilled therapeutic intervention in order to improve the following deficits and impairments:  Pain, Postural dysfunction, Improper body mechanics, Increased muscle spasms, Impaired flexibility, Decreased strength, Difficulty walking, Decreased activity tolerance  Visit Diagnosis: Chronic bilateral low back pain  without sciatica  Chronic pain of left knee  Chronic pain of right knee  Pain in left hip  Pain in right hip  Muscle weakness (generalized)  Difficulty in walking, not elsewhere classified     Problem List Patient Active Problem List   Diagnosis Date Noted  . Patellofemoral syndrome, bilateral 08/17/2018  . Chronic bilateral low back pain without sciatica 08/17/2018  . Pain of left heel 07/14/2018  . Pain of right heel 07/14/2018  . Leukocytosis 07/14/2018  . Hirsutism 05/26/2018  . Abnormal WBC count 05/26/2018  . Psychophysiological insomnia 05/26/2018  . Anxiety and depression 05/26/2018  . Chronic tension-type headache, not intractable 05/26/2018  . Moderate persistent asthma with exacerbation 02/05/2018  . Type 2 diabetes mellitus without complication, without long-term current use of insulin ([place]) 08/19/2017  . Pregnancy 08/19/2017  . OTHER ACUTE REACTIONS TO STRESS 12/04/2009    Percival Spanish, PT, MPT 09/13/2018, 6:48 PM  [facility] [address]  [place], [place], [address] Phone: [phone]   Fax:  [phone]  Name: Rachael Fowler MRN: [id number] Date of Birth: 01-05-1986  PHYSICAL THERAPY DISCHARGE SUMMARY  Visits from Start of Care: 3  Current functional level related to goals / functional outcomes:   Refer to above clinical impression for status as of last visit on 09/13/18. Pt only returned for 2 treatment visits following eval, then cancelled 3rd of initial 3 Medicaid approved visits on 09/20/18 due to illness. Pt failed to reschedule or return prior to expiration of initial Medicaid authorization on 09/26/18, therefore will proceed with discharge from PT for this episode. Unable to formally assess status at discharge due to failure to return.   Remaining deficits:   As above.   Education / Equipment:   HEP  Plan: Patient agrees to discharge.  Patient goals were not met. Patient is  being discharged due to not returning since the last visit.  ?????     Percival Spanish, PT, MPT 10/05/18, 11:27 AM  [facility] [address]  [place], [place], [address] Phone: [phone]   Fax:  [phone]

## 2018-09-20 ENCOUNTER — Encounter: Payer: Medicaid Other | Admitting: Physical Therapy

## 2018-09-28 ENCOUNTER — Ambulatory Visit: Payer: Medicaid Other | Admitting: Sports Medicine

## 2018-10-02 ENCOUNTER — Other Ambulatory Visit: Payer: Self-pay | Admitting: Osteopathic Medicine

## 2018-10-29 ENCOUNTER — Ambulatory Visit: Payer: Medicaid Other | Admitting: Osteopathic Medicine

## 2018-11-12 ENCOUNTER — Ambulatory Visit (INDEPENDENT_AMBULATORY_CARE_PROVIDER_SITE_OTHER): Payer: Medicaid Other | Admitting: Osteopathic Medicine

## 2018-11-12 ENCOUNTER — Encounter: Payer: Self-pay | Admitting: Osteopathic Medicine

## 2018-11-12 ENCOUNTER — Telehealth: Payer: Self-pay | Admitting: Osteopathic Medicine

## 2018-11-12 VITALS — BP 131/91 | HR 98 | Temp 98.1°F | Wt 236.4 lb

## 2018-11-12 DIAGNOSIS — F419 Anxiety disorder, unspecified: Secondary | ICD-10-CM

## 2018-11-12 DIAGNOSIS — E119 Type 2 diabetes mellitus without complications: Secondary | ICD-10-CM | POA: Diagnosis not present

## 2018-11-12 DIAGNOSIS — R635 Abnormal weight gain: Secondary | ICD-10-CM | POA: Diagnosis not present

## 2018-11-12 DIAGNOSIS — R197 Diarrhea, unspecified: Secondary | ICD-10-CM

## 2018-11-12 DIAGNOSIS — R110 Nausea: Secondary | ICD-10-CM | POA: Diagnosis not present

## 2018-11-12 LAB — POCT GLYCOSYLATED HEMOGLOBIN (HGB A1C): Hemoglobin A1C: 9.5 % — AB (ref 4.0–5.6)

## 2018-11-12 LAB — POCT UA - MICROALBUMIN
Albumin/Creatinine Ratio, Urine, POC: ABNORMAL
CREATININE, POC: 50 mg/dL
Microalbumin Ur, POC: 10 mg/L

## 2018-11-12 MED ORDER — ESCITALOPRAM OXALATE 10 MG PO TABS: 10.0000 mg | ORAL_TABLET | Freq: Every day | ORAL | 0 refills | Status: DC

## 2018-11-12 MED ORDER — DULAGLUTIDE 0.75 MG/0.5ML ~~LOC~~ SOAJ: 0.7500 mg | SUBCUTANEOUS | 5 refills | Status: DC

## 2018-11-12 MED ORDER — SEMAGLUTIDE(0.25 OR 0.5MG/DOS) 2 MG/1.5ML ~~LOC~~ SOPN: PEN_INJECTOR | SUBCUTANEOUS | 5 refills | Status: DC

## 2018-11-12 MED ORDER — METFORMIN HCL ER 500 MG PO TB24: 1000.0000 mg | ORAL_TABLET | Freq: Every day | ORAL | 1 refills | Status: DC

## 2018-11-12 NOTE — Patient Instructions (Signed)
Will restart metformin Will add injectable medicine for sugars and weight Will start lexapro for anxiety Will get labs today to check thyroid

## 2018-11-12 NOTE — Telephone Encounter (Signed)
I received a fax that Ozempic requires authorization. The preferred products are as follows:  Byetta/Bydureon, Trulicity or Victoza. Please advise.

## 2018-11-12 NOTE — Telephone Encounter (Signed)
OK will change to Trulicity. We don't often deal with Medicaid so I wasn't sure. Thanks!

## 2018-11-12 NOTE — Progress Notes (Signed)
HPI: Rachael Fowler is a 33 y.o. female who  has a past medical history of Asthma.  she presents to [facility] today, 11/12/18,  for chief complaint of:  Diabetes follow-up Weight gain Anxiety  Diabetes: Patient has stopped taking all of her medications, she is overall feeling better in terms of pain and energy, A1c however is not a pretty picture.  She went from 6-9.5.  Anxiety: Overall her mood is proved after stopping fluoxetine, has been on other antidepressants/antianxiety medications in the past including Celexa.  Still suffering from some anxiety issues she would like to possibly start a medication for.  Weight gain, would like thyroid checked.  Occasional nausea, occasional couple of days of frequent loose stools resolves on its own.  No abdominal pain, blood in the stool, fever.     At today's visit... Past medical history, surgical history, and family history reviewed and updated as needed.  Current medication list and allergy/intolerance information reviewed and updated as needed. (See remainder of HPI, ROS, Phys Exam below)   No results found.  Results for orders placed or performed in visit on 11/12/18 (from the past 72 hour(s))  POCT HgB A1C     Status: Abnormal   Collection Time: 11/12/18 11:12 AM  Result Value Ref Range   Hemoglobin A1C 9.5 (A) 4.0 - 5.6 %   HbA1c POC (<> result, manual entry)     HbA1c, POC (prediabetic range)     HbA1c, POC (controlled diabetic range)    POCT UA - Microalbumin     Status: None   Collection Time: 11/12/18 11:12 AM  Result Value Ref Range   Microalbumin Ur, POC 10 mg/L   Creatinine, POC 50 mg/dL   Albumin/Creatinine Ratio, Urine, POC ABNORMAL     Comment: 30 - 300 MG/G    Wt Readings from Last 3 Encounters:  11/12/18 236 lb 6.4 oz (107.2 kg)  08/12/18 227 lb (103 kg)  07/14/18 220 lb 8 oz (100 kg)         ASSESSMENT/PLAN: The primary encounter diagnosis was Type 2 diabetes  mellitus without complication, without long-term current use of insulin (HCC). Diagnoses of Anxiety, Weight gain, Nausea, and Diarrhea, unspecified type were also pertinent to this visit.   Orders Placed This Encounter  Procedures  . TSH  . COMPLETE METABOLIC PANEL WITH GFR  . Lipase  . POCT HgB A1C  . POCT UA - Microalbumin     Meds ordered this encounter  Medications  . Semaglutide,0.25 or 0.5MG /DOS, (OZEMPIC, 0.25 OR 0.5 MG/DOSE,) 2 MG/1.5ML SOPN    Sig: Inject 0.25 mg into the skin once a week for 28 days, THEN 0.5 mg once a week for 28 days.    Dispense:  2 pen    Refill:  5  . escitalopram (LEXAPRO) 10 MG tablet    Sig: Take 1 tablet (10 mg total) by mouth at bedtime.    Dispense:  90 tablet    Refill:  0  . metFORMIN (GLUCOPHAGE XR) 500 MG 24 hr tablet    Sig: Take 2 tablets (1,000 mg total) by mouth daily with breakfast.    Dispense:  180 tablet    Refill:  1    Patient Instructions  Will restart metformin Will add injectable medicine for sugars and weight Will start lexapro for anxiety Will get labs today to check thyroid        Follow-up plan: Return in about 4 weeks (around 12/10/2018) for recheck  anxiety on new medicine, follow up on weight and sugars (no A1C needed).                             ############################################ ############################################ ############################################ ############################################    No outpatient medications have been marked as taking for the 11/12/18 encounter (Office Visit) with Sunnie Nielsen, DO.    Allergies  Allergen Reactions  . Hydrocodone Itching and Nausea And Vomiting    Also nausea and vomiting   . Penicillins Itching, Nausea And Vomiting and Other (See Comments)  . Hydrocodone-Acetaminophen Itching and Nausea And Vomiting  . Penicillin G Itching and Nausea And Vomiting       Review of  Systems:  Constitutional: No recent illness  HEENT: No  headache, no vision change  Cardiac: No  chest pain, No  pressure, No palpitations  Respiratory:  No  shortness of breath. No  Cough  Gastrointestinal: No  abdominal pain, no change on bowel habits  Musculoskeletal: No new myalgia/arthralgia  Skin: No  Rash  Neurologic: No  weakness, No  Dizziness  Psychiatric: No  concerns with depression, +  concerns with anxiety  Exam:  BP (!) 131/91 (BP Location: Left Arm, Patient Position: Sitting, Cuff Size: Normal)   Pulse 98   Temp 98.1 F (36.7 C) (Oral)   Wt 236 lb 6.4 oz (107.2 kg)   BMI 40.58 kg/m   Constitutional: VS see above. General Appearance: alert, well-developed, well-nourished, NAD  Eyes: Normal lids and conjunctive, non-icteric sclera  Ears, Nose, Mouth, Throat: MMM, Normal external inspection ears/nares/mouth/lips/gums.  Neck: No masses, trachea midline.   Respiratory: Normal respiratory effort. no wheeze, no rhonchi, no rales  Cardiovascular: S1/S2 normal, no murmur, no rub/gallop auscultated. RRR.   Musculoskeletal: Gait normal. Symmetric and independent movement of all extremities  Neurological: Normal balance/coordination. No tremor.  Skin: warm, dry, intact.   Psychiatric: Normal judgment/insight. Normal mood and affect. Oriented x3.       Visit summary with medication list and pertinent instructions was printed for patient to review, patient was advised to alert [place] if any updates are needed. All questions at time of visit were answered - patient instructed to contact office with any additional concerns. ER/RTC precautions were reviewed with the patient and understanding verbalized.     Please note: voice recognition software was used to produce this document, and typos may escape review. Please contact Dr. Lyn Hollingshead for any needed clarifications.    Follow up plan: Return in about 4 weeks (around 12/10/2018) for recheck anxiety on new  medicine, follow up on weight and sugars (no A1C needed).

## 2018-11-12 NOTE — Telephone Encounter (Signed)
Called patient to make her aware of the change from Ozempic to Trulicity. She voices understanding. I have filled out the form and waiting on her signature to fax the form.

## 2018-11-17 NOTE — Telephone Encounter (Signed)
Form has been faxed to insurance and waiting on a response.  

## 2018-11-20 LAB — COMPLETE METABOLIC PANEL WITH GFR
AG RATIO: 1.6 (calc) (ref 1.0–2.5)
ALT: 36 U/L — AB (ref 6–29)
AST: 43 U/L — ABNORMAL HIGH (ref 10–30)
Albumin: 3.8 g/dL (ref 3.6–5.1)
Alkaline phosphatase (APISO): 77 U/L (ref 33–115)
BUN: 15 mg/dL (ref 7–25)
CO2: 26 mmol/L (ref 20–32)
Calcium: 9.3 mg/dL (ref 8.6–10.2)
Chloride: 104 mmol/L (ref 98–110)
Creat: 0.73 mg/dL (ref 0.50–1.10)
GFR, EST AFRICAN AMERICAN: 128 mL/min/{1.73_m2} (ref >=60)
GFR, EST NON AFRICAN AMERICAN: 111 mL/min/{1.73_m2} (ref >=60)
Globulin: 2.4 g/dL (calc) (ref 1.9–3.7)
Glucose, Bld: 278 mg/dL — ABNORMAL HIGH (ref 65–99)
Potassium: 4.1 mmol/L (ref 3.5–5.3)
Sodium: 138 mmol/L (ref 135–146)
TOTAL PROTEIN: 6.2 g/dL (ref 6.1–8.1)
Total Bilirubin: 0.2 mg/dL (ref 0.2–1.2)

## 2018-11-20 LAB — LIPASE: Lipase: 20 U/L (ref 7–60)

## 2018-11-20 LAB — TSH: TSH: 0.78 mIU/L

## 2018-11-29 MED ORDER — EXENATIDE ER 2 MG ~~LOC~~ PEN: 2.0000 mg | PEN_INJECTOR | SUBCUTANEOUS | 5 refills | Status: DC

## 2018-11-29 NOTE — Telephone Encounter (Signed)
Patient has been notified and reports that if a medication is on the preferred list it will not need authorization. I advised the patient to call back with any questions.

## 2018-11-29 NOTE — Telephone Encounter (Signed)
Send Bydureon!

## 2018-11-29 NOTE — Telephone Encounter (Signed)
Called [facility] [employer] and spoke with Florentina Addison to follow up on the PA for Trulicity.  She states that the list has been updated and the preferred now is Bydureon. Trulicity is not covered anymore under this plan. Please advise.

## 2018-12-10 ENCOUNTER — Encounter: Payer: Self-pay | Admitting: Osteopathic Medicine

## 2018-12-10 ENCOUNTER — Ambulatory Visit (INDEPENDENT_AMBULATORY_CARE_PROVIDER_SITE_OTHER): Payer: Medicaid Other | Admitting: Osteopathic Medicine

## 2018-12-10 VITALS — BP 124/87 | HR 96 | Temp 98.6°F | Wt 230.8 lb

## 2018-12-10 DIAGNOSIS — E119 Type 2 diabetes mellitus without complications: Secondary | ICD-10-CM

## 2018-12-10 DIAGNOSIS — F419 Anxiety disorder, unspecified: Secondary | ICD-10-CM | POA: Diagnosis not present

## 2018-12-10 MED ORDER — ESCITALOPRAM OXALATE 5 MG PO TABS: 5.0000 mg | ORAL_TABLET | Freq: Every day | ORAL | 0 refills | Status: DC

## 2018-12-10 NOTE — Patient Instructions (Signed)
Come see me in early April to recheck A1c/diabetes.  In the meantime, can try increasing the Lexapro from 10 mg up to 15 mg.  I sent in 5 mg dose to the pharmacy to add to the pills you are already taking.  See me sooner if needed!

## 2018-12-10 NOTE — Progress Notes (Signed)
HPI: Rachael Fowler is a 33 y.o. female who  has a past medical history of Asthma.  she presents to [facility] today, 12/10/18,  for chief complaint of:  Anxiety follow-up BP check Discuss diabetes   Anxiety: Visit, we discussed previous medications the patient had taken for anxiety/depression which were not helpful or caused side effects.  We started Lexapro.  Today she reports doing a whole lot better on this medicine, not quite where she wants to be in terms of mood/anxiety.  Would like to discuss increasing this medicine slightly.  Diabetes: At last visit, patient had stopped all of her medications, A1c went to 9.5.  We restarted metfromin and added exenatide to help Glc and weight.  She is doing well on these medications, no significant nausea or other GI symptoms.  Pressure a bit elevated on intake, 154/95.  On recheck was improved to 124/87.  Patient reports previous blood pressure medication caused palpitations.     At today's visit 12/10/18 ... PMH, PSH, FH reviewed and updated as needed.  Current medication list and allergy/intolerance hx reviewed and updated as needed. (See remainder of HPI, ROS, Phys Exam below)        ASSESSMENT/PLAN: The primary encounter diagnosis was Anxiety. A diagnosis of Type 2 diabetes mellitus without complication, without long-term current use of insulin (HCC) was also pertinent to this visit.   Doing well on current medications, okay to continue.  Will increase dose of Lexapro slightly   Meds ordered this encounter  Medications  . escitalopram (LEXAPRO) 5 MG tablet    Sig: Take 1 tablet (5 mg total) by mouth daily. Take with 10 mg tablets for total of 15 mg daily    Dispense:  90 tablet    Refill:  0    Patient Instructions  Come see me in early April to recheck A1c/diabetes.  In the meantime, can try increasing the Lexapro from 10 mg up to 15 mg.  I sent in 5 mg dose to the pharmacy to add to the  pills you are already taking.  See me sooner if needed!        Follow-up plan: Return for A1C recheck 01/2019.                                                 ################################################# ################################################# ################################################# #################################################    Current Meds  Medication Sig  . Blood Glucose Monitoring Suppl (ACCU-CHEK AVIVA PLUS) w/Device KIT Accu-Chek Aviva Plus Meter  USE AS DIRECTED  . escitalopram (LEXAPRO) 10 MG tablet Take 1 tablet (10 mg total) by mouth at bedtime.  . Exenatide ER 2 MG PEN Inject 2 mg into the skin once a week.  . metFORMIN (GLUCOPHAGE XR) 500 MG 24 hr tablet Take 2 tablets (1,000 mg total) by mouth daily with breakfast.    Allergies  Allergen Reactions  . Hydrocodone Itching and Nausea And Vomiting    Also nausea and vomiting   . Penicillins Itching, Nausea And Vomiting and Other (See Comments)  . Hydrocodone-Acetaminophen Itching and Nausea And Vomiting  . Penicillin G Itching and Nausea And Vomiting       Review of Systems:  Constitutional: No recent illness  Cardiac: No  chest pain  Respiratory:  No  shortness of breath  Gastrointestinal: No  abdominal pain, no change on   bowel habits  Psychiatric: No  concerns with depression, +concerns with anxiety  Exam:  BP 124/87 (BP Location: Left Arm, Patient Position: Sitting, Cuff Size: Normal)   Pulse 96   Temp 98.6 F (37 C) (Oral)   Wt 230 lb 12.8 oz (104.7 kg)   BMI 39.62 kg/m   Constitutional: VS see above. General Appearance: alert, well-developed, well-nourished, NAD  Eyes: Normal lids and conjunctive, non-icteric sclera  Ears, Nose, Mouth, Throat: MMM, Normal external inspection ears/nares/mouth/lips/gums.  Neck: No masses, trachea midline.   Respiratory: Normal respiratory effort. no wheeze, no rhonchi, no  rales  Cardiovascular: S1/S2 normal, no murmur, no rub/gallop auscultated. RRR.   Musculoskeletal: Gait normal.  Neurological: Normal balance/coordination. No tremor.  Skin: warm, dry, intact.   Psychiatric: Normal judgment/insight. Normal mood and affect. Oriented x3.       Visit summary with medication list and pertinent instructions was printed for patient to review, patient was advised to alert us if any updates are needed. All questions at time of visit were answered - patient instructed to contact office with any additional concerns. ER/RTC precautions were reviewed with the patient and understanding verbalized.   Note: Total time spent 15 minutes, greater than 50% of the visit was spent face-to-face counseling and coordinating care for the following: The primary encounter diagnosis was Anxiety. A diagnosis of Type 2 diabetes mellitus without complication, without long-term current use of insulin (HCC) was also pertinent to this visit..  Please note: voice recognition software was used to produce this document, and typos may escape review. Please contact Dr.  for any needed clarifications.    Follow up plan: Return for A1C recheck 01/2019.   

## 2018-12-12 ENCOUNTER — Other Ambulatory Visit: Payer: Self-pay | Admitting: Osteopathic Medicine

## 2018-12-14 NOTE — Telephone Encounter (Signed)
Left a detailed vm msg for pt regarding med refill request from pharmacy. Direct call back info provided.

## 2018-12-14 NOTE — Telephone Encounter (Signed)
Would confirm with patient - this may be on auto-refill so she'd have to cancel this with [facility] if she doesn't want it. If she'd like a refill, I'm ok to do that

## 2018-12-14 NOTE — Telephone Encounter (Signed)
[facility] requesting med refill for meloxicam. At last OV, pt no longer taking med. Pls advise, thanks.

## 2019-01-18 ENCOUNTER — Encounter: Payer: Self-pay | Admitting: Osteopathic Medicine

## 2019-01-19 ENCOUNTER — Other Ambulatory Visit: Payer: Self-pay | Admitting: Osteopathic Medicine

## 2019-01-19 MED ORDER — MELOXICAM 15 MG PO TABS: 15.0000 mg | ORAL_TABLET | Freq: Every day | ORAL | 2 refills | Status: DC

## 2019-01-19 NOTE — Telephone Encounter (Signed)
Last refilled 07/14/18 for #30 with 2 RF

## 2019-01-25 ENCOUNTER — Telehealth: Payer: Self-pay

## 2019-01-25 NOTE — Telephone Encounter (Signed)
Patient will be due for a follow up A1C on 02/11/19. Her last A1C was 9.5.  Pt states she prefers to come into office for this follow up so that she can have A1C done and talk with provider.  OK if I schedule her on or after 02/11/19 for an IN OFFICE follow up?

## 2019-01-26 ENCOUNTER — Ambulatory Visit: Payer: Medicaid Other | Admitting: Osteopathic Medicine

## 2019-01-26 NOTE — Telephone Encounter (Signed)
Scheduled

## 2019-01-26 NOTE — Telephone Encounter (Signed)
Yes! I think we are still trying to stick to AM appts for non-sick patients

## 2019-02-11 ENCOUNTER — Ambulatory Visit: Payer: Medicaid Other | Admitting: Osteopathic Medicine

## 2019-02-22 ENCOUNTER — Encounter: Payer: Self-pay | Admitting: Osteopathic Medicine

## 2019-02-24 ENCOUNTER — Encounter: Payer: Self-pay | Admitting: Osteopathic Medicine

## 2019-02-24 ENCOUNTER — Ambulatory Visit (INDEPENDENT_AMBULATORY_CARE_PROVIDER_SITE_OTHER): Payer: Medicaid Other | Admitting: Osteopathic Medicine

## 2019-02-24 DIAGNOSIS — R6884 Jaw pain: Secondary | ICD-10-CM | POA: Diagnosis not present

## 2019-02-24 DIAGNOSIS — K0889 Other specified disorders of teeth and supporting structures: Secondary | ICD-10-CM | POA: Diagnosis not present

## 2019-02-24 MED ORDER — IBUPROFEN 800 MG PO TABS: 800.0000 mg | ORAL_TABLET | Freq: Three times a day (TID) | ORAL | 0 refills | Status: DC | PRN

## 2019-02-24 MED ORDER — LIDOCAINE VISCOUS HCL 2 % MT SOLN: 5.0000 mL | OROMUCOSAL | 1 refills | Status: DC | PRN

## 2019-02-24 NOTE — Progress Notes (Signed)
Virtual Visit  via Video Note (Doximity)   I connected with      Rachael Fowler on 02/24/19 at 1:00 by a telemedicine application and verified that I am speaking with the correct person using two identifiers.   I discussed the limitations of evaluation and management by telemedicine and the availability of in person appointments. The patient expressed understanding and agreed to proceed.  History of Present Illness: Rachael Fowler is a 33 y.o. female who would like to discuss dental pain  Initially few mos ago had pain in 2 teeth only, had dental work on top/molars, then bottom teeth were painful on the L, felt worse after subsequent work, it was eventually pulled it and still felt bad, improved w/ packing,   Today reports pain goes up into sinuses and down into jaw bone.   Has been 2 months since seen the dentist  Has been told "there's nothing wrong" after calling w/ pain concerns couple weeks/month after her most recent procedure.  Has NOT called them in about a month Requests pain medications      Observations/Objective: There were no vitals taken for this visit. BP Readings from Last 3 Encounters:  12/10/18 124/87  11/12/18 (!) 131/91  08/17/18 122/83   Exam: Normal Speech.  NAD  Lab and Radiology Results No results found for this or any previous visit (from the past 72 hour(s)). No results found.     Assessment and Plan: 33 y.o. female with The primary encounter diagnosis was Pain, dental. A diagnosis of Jaw pain, non-TMJ was also pertinent to this visit.  Advised needs to call dentist if pain after procedure is lasting longer than she was initially told to expect.   No opiate pain meds, she's been using her mom's    PDMP not reviewed this encounter. No orders of the defined types were placed in this encounter.  Meds ordered this encounter  Medications  . ibuprofen (ADVIL) 800 MG tablet    Sig: Take 1 tablet (800 mg total) by mouth every 8 (eight) hours as  needed.    Dispense:  60 tablet    Refill:  0  . lidocaine (XYLOCAINE) 2 % solution    Sig: Use as directed 5-10 mLs in the mouth or throat every 3 (three) hours as needed.    Dispense:  100 mL    Refill:  1   There are no Patient Instructions on file for this visit. Instructions sent via MyChart. If MyChart not available, pt was given option for info via personal e-mail w/ no guarantee of protected health info over unsecured e-mail communication, and MyChart sign-up instructions were included.   Follow Up Instructions: Return for routine care when due .    I discussed the assessment and treatment plan with the patient. The patient was provided an opportunity to ask questions and all were answered. The patient agreed with the plan and demonstrated an understanding of the instructions.   The patient was advised to call back or seek an in-person evaluation if the symptoms worsen or if the condition fails to improve as anticipated.  Provided 24 minutes of non-face-to-face time during this encounter.                      Historical information moved to improve visibility of documentation.  Past Medical History:  Diagnosis Date  . Asthma    Past Surgical History:  Procedure Laterality Date  . KNEE SURGERY  04/20/2006  Social History   Tobacco Use  . Smoking status: Current Every Day Smoker    Packs/day: 0.50  . Smokeless tobacco: Never Used  Substance Use Topics  . Alcohol use: Not on file   family history includes Cancer in her maternal grandmother and maternal uncle; Depression in her mother; Diabetes in her maternal uncle and mother; Hyperlipidemia in her father, maternal grandmother, mother, and paternal grandmother; Hypertension in her father, maternal grandmother, and mother.  Medications: Current Outpatient Medications  Medication Sig Dispense Refill  . Blood Glucose Monitoring Suppl (ACCU-CHEK AVIVA PLUS) w/Device KIT Accu-Chek Aviva Plus Meter  USE  AS DIRECTED    . escitalopram (LEXAPRO) 10 MG tablet Take 1 tablet (10 mg total) by mouth at bedtime. 90 tablet 0  . escitalopram (LEXAPRO) 5 MG tablet Take 1 tablet (5 mg total) by mouth daily. Take with 10 mg tablets for total of 15 mg daily 90 tablet 0  . Exenatide ER 2 MG PEN Inject 2 mg into the skin once a week. 4 each 5  . meloxicam (MOBIC) 15 MG tablet Take 1 tablet (15 mg total) by mouth daily. As needed for aches/pains 30 tablet 2  . metFORMIN (GLUCOPHAGE XR) 500 MG 24 hr tablet Take 2 tablets (1,000 mg total) by mouth daily with breakfast. 180 tablet 1  . ibuprofen (ADVIL) 800 MG tablet Take 1 tablet (800 mg total) by mouth every 8 (eight) hours as needed. 60 tablet 0  . lidocaine (XYLOCAINE) 2 % solution Use as directed 5-10 mLs in the mouth or throat every 3 (three) hours as needed. 100 mL 1   No current facility-administered medications for this visit.    Allergies  Allergen Reactions  . Hydrocodone Itching and Nausea And Vomiting    Also nausea and vomiting   . Penicillins Itching, Nausea And Vomiting and Other (See Comments)  . Hydrocodone-Acetaminophen Itching and Nausea And Vomiting  . Penicillin G Itching and Nausea And Vomiting    PDMP not reviewed this encounter. No orders of the defined types were placed in this encounter.  Meds ordered this encounter  Medications  . ibuprofen (ADVIL) 800 MG tablet    Sig: Take 1 tablet (800 mg total) by mouth every 8 (eight) hours as needed.    Dispense:  60 tablet    Refill:  0  . lidocaine (XYLOCAINE) 2 % solution    Sig: Use as directed 5-10 mLs in the mouth or throat every 3 (three) hours as needed.    Dispense:  100 mL    Refill:  1

## 2019-02-25 ENCOUNTER — Other Ambulatory Visit: Payer: Self-pay | Admitting: Osteopathic Medicine

## 2019-02-25 DIAGNOSIS — F419 Anxiety disorder, unspecified: Secondary | ICD-10-CM

## 2019-02-28 ENCOUNTER — Encounter: Payer: Self-pay | Admitting: Osteopathic Medicine

## 2019-02-28 DIAGNOSIS — K089 Disorder of teeth and supporting structures, unspecified: Principal | ICD-10-CM

## 2019-02-28 DIAGNOSIS — G8929 Other chronic pain: Secondary | ICD-10-CM

## 2019-02-28 NOTE — Telephone Encounter (Signed)
Please advise 

## 2019-03-03 NOTE — Telephone Encounter (Signed)
MRI pending clinical review. Service Order: [id number]. Will provide update when notified.

## 2019-03-03 NOTE — Telephone Encounter (Signed)
Rachael Fowler, can we call radiology and see if they can do an MRI for dental pain? I see order in epic for MRI Mandible for TMJ so I put in that order. It would need contrast though...   Or we can refer to maxillofacial surgeon for second opinion if we aren't able to get imaging.   Thanks!

## 2019-03-04 NOTE — Telephone Encounter (Signed)
Noted, thanks!

## 2019-03-09 ENCOUNTER — Telehealth: Payer: Self-pay

## 2019-03-09 DIAGNOSIS — G8929 Other chronic pain: Secondary | ICD-10-CM

## 2019-03-09 DIAGNOSIS — K089 Disorder of teeth and supporting structures, unspecified: Secondary | ICD-10-CM

## 2019-03-09 NOTE — Telephone Encounter (Signed)
Pt was updated regarding provider's note. As per pt, feels that her dentist will not do anything. Prefers a referral for specialist for second opinion.

## 2019-03-09 NOTE — Telephone Encounter (Signed)
I got a notice earlier today that the MRI was denied by her insurance. I think we might need to send her to a specialist or she can follow-up with her dentist. If she'd like referral for second opinion, let me know  (I know she had some concerns that her dentist was not really worried about anything more than tooth sensitivity, but I'd have her advocate for herself here - if she thinks something else is wrong they need to do something about it or tell her why they can be confident it's sensitivity.)

## 2019-03-09 NOTE — Telephone Encounter (Signed)
Pt called requesting an update on an MRI referral. Pt stated she has not been contacted to have testing done. Pls advise, thanks.

## 2019-03-09 NOTE — Telephone Encounter (Signed)
OK-referral placed.

## 2019-03-09 NOTE — Telephone Encounter (Signed)
Pt has been updated of referral. No other inquiries during call.

## 2019-03-31 ENCOUNTER — Ambulatory Visit (INDEPENDENT_AMBULATORY_CARE_PROVIDER_SITE_OTHER): Payer: Medicaid Other | Admitting: Osteopathic Medicine

## 2019-03-31 ENCOUNTER — Encounter: Payer: Self-pay | Admitting: Osteopathic Medicine

## 2019-03-31 VITALS — BP 140/90 | HR 87 | Temp 98.5°F | Wt 227.6 lb

## 2019-03-31 DIAGNOSIS — K0889 Other specified disorders of teeth and supporting structures: Secondary | ICD-10-CM | POA: Diagnosis not present

## 2019-03-31 DIAGNOSIS — E119 Type 2 diabetes mellitus without complications: Secondary | ICD-10-CM

## 2019-03-31 DIAGNOSIS — H6591 Unspecified nonsuppurative otitis media, right ear: Secondary | ICD-10-CM

## 2019-03-31 LAB — POCT GLYCOSYLATED HEMOGLOBIN (HGB A1C): Hemoglobin A1C: 7.5 % — AB (ref 4.0–5.6)

## 2019-03-31 MED ORDER — DULAGLUTIDE 0.75 MG/0.5ML ~~LOC~~ SOAJ: 0.7500 mg | SUBCUTANEOUS | 11 refills | Status: DC

## 2019-03-31 MED ORDER — TRAZODONE HCL 50 MG PO TABS: 25.0000 mg | ORAL_TABLET | Freq: Every evening | ORAL | 0 refills | Status: DC | PRN

## 2019-03-31 MED ORDER — DOXYCYCLINE HYCLATE 100 MG PO TABS: 100.0000 mg | ORAL_TABLET | Freq: Two times a day (BID) | ORAL | 0 refills | Status: DC

## 2019-03-31 NOTE — Progress Notes (Signed)
HPI: Rachael Fowler is a 33 y.o. female who  has a past medical history of Asthma.  she presents to [facility] today, 03/31/19,  for chief complaint of:  A1C check  BP check   Blood pressure was elevated at dentist office, 151/111.  In office, 140/90.  At dentist office, was measured with a wrist monitor.  A1C today at 7.5%.  Down from 9.5% a couple of months ago.  She was unfortunately not able to tolerate some of the injectable medication. Patient had to stop taking the Ozempic due to nausea issues.  By durian was a bit better but she reports that the needle was quite painful.  She is still taking metformin 1000 mg XR  And about right ear infection, has felt fullness/pressure for the past week or so.  She was on antibiotics for dental infection and soon as these wore off she is noting more sinus/ear pain and dental pain on that side.  BP Readings from Last 3 Encounters:  03/31/19 140/90  12/10/18 124/87  11/12/18 (!) 131/91      At today's visit 03/31/19 ... PMH, PSH, FH reviewed and updated as needed.  Current medication list and allergy/intolerance hx reviewed and updated as needed. (See remainder of HPI, ROS, Phys Exam below)   No results found.  Results for orders placed or performed in visit on 03/31/19 (from the past 72 hour(s))  POCT HgB A1C     Status: Abnormal   Collection Time: 03/31/19  1:13 PM  Result Value Ref Range   Hemoglobin A1C 7.5 (A) 4.0 - 5.6 %   HbA1c POC (<> result, manual entry)     HbA1c, POC (prediabetic range)     HbA1c, POC (controlled diabetic range)            ASSESSMENT/PLAN: The primary encounter diagnosis was Type 2 diabetes mellitus without complication, without long-term current use of insulin ([facility]). Diagnoses of Pain, dental and Right non-suppurative otitis media were also pertinent to this visit.   Orders Placed This Encounter  Procedures  . POCT HgB A1C     Meds ordered this encounter   Medications  . Dulaglutide (TRULICITY) 1.61 WR/6.0AV SOPN    Sig: Inject 0.75 mg into the skin once a week.    Dispense:  4 pen    Refill:  11    Cancer Ozempic, Bydureon  . doxycycline (VIBRA-TABS) 100 MG tablet    Sig: Take 1 tablet (100 mg total) by mouth 2 (two) times daily.    Dispense:  14 tablet    Refill:  0    Patient Instructions  Plan:  Make sure the pharmacy knows to cancel the by durian and the Ozempic, I have routed a note to them but please double check.  We are starting a new injectable medication called Trulicity, this is also a once a week medicine.  Please contact your endocrinologist to schedule a follow-up.  They would be able to best answer the question about whether or not there may be anything going on as far as type 1 diabetes, and if there is anything about this that would change medication management.  I sent antibiotics in for ear/sinus infection.  If you continue to experience infections here, we may need to consult with ENT specialist.       Follow-up plan: Return in about 3 months (around 07/01/2019) for Folow up on A1c, and less told otherwise by endocrinology.                                                 ################################################# ################################################# ################################################# #################################################  Current Meds  Medication Sig  . Blood Glucose Monitoring Suppl (ACCU-CHEK AVIVA PLUS) w/Device KIT Accu-Chek Aviva Plus Meter  USE AS DIRECTED  . escitalopram (LEXAPRO) 10 MG tablet TAKE 1 TABLET BY MOUTH EVERYDAY AT BEDTIME  . escitalopram (LEXAPRO) 5 MG tablet TAKE 1 TABLET (5 MG TOTAL) BY MOUTH DAILY. TAKE WITH 10 MG TABLETS FOR TOTAL OF 15 MG DAILY  . ibuprofen (ADVIL) 800 MG tablet Take 1 tablet (800 mg total) by mouth every 8 (eight) hours as needed.  . lidocaine (XYLOCAINE) 2 % solution  Use as directed 5-10 mLs in the mouth or throat every 3 (three) hours as needed.  . loratadine (CLARITIN) 10 MG tablet Take 10 mg by mouth daily.  . meloxicam (MOBIC) 15 MG tablet Take 1 tablet (15 mg total) by mouth daily. As needed for aches/pains  . metFORMIN (GLUCOPHAGE XR) 500 MG 24 hr tablet Take 2 tablets (1,000 mg total) by mouth daily with breakfast.  . omeprazole (PRILOSEC) 10 MG capsule Take 10 mg by mouth daily.  . [DISCONTINUED] Exenatide ER 2 MG PEN Inject 2 mg into the skin once a week.    Allergies  Allergen Reactions  . Hydrocodone Itching and Nausea And Vomiting    Also nausea and vomiting   . Penicillins Itching, Nausea And Vomiting and Other (See Comments)  . Hydrocodone-Acetaminophen Itching and Nausea And Vomiting  . Penicillin G Itching and Nausea And Vomiting       Review of Systems:  Constitutional: No recent illness other than dental issues   HEENT: No  headache, no vision change  Cardiac: No  chest pain, No  pressure, No palpitations  Respiratory:  No  shortness of breath. No  Cough  Gastrointestinal: No  abdominal pain, no change on bowel habits  Neurologic: No  weakness, No  Dizziness  Psychiatric: No  concerns with depression, No  concerns with anxiety  Exam:  BP 140/90 (BP Location: Left Arm, Patient Position: Sitting, Cuff Size: Large)   Pulse 87   Temp 98.5 F (36.9 C) (Oral)   Wt 227 lb 9.6 oz (103.2 kg)   BMI 39.07 kg/m   Constitutional: VS see above. General Appearance: alert, well-developed, well-nourished, NAD  Eyes: Normal lids and conjunctive, non-icteric sclera  Ears, Nose, Mouth, Throat: MMM, Normal external inspection ears/nares/mouth/lips/gums.  Tympanic membrane red/dull on the right side, mild cloudy effusion behind the tympanic membrane, canal slightly erythematous, normal movement of jaw  Neck: No masses, trachea midline.  No palpable lymphadenopathy  Respiratory: Normal respiratory effort.   Musculoskeletal: Gait  normal. Symmetric and independent movement of all extremities  Neurological: Normal balance/coordination. No tremor.  Skin: warm, dry, intact.   Psychiatric: Normal judgment/insight. Normal mood and affect. Oriented x3.       Visit summary with medication list and pertinent instructions was printed for patient to review, patient was advised to alert [place] if any updates are needed. All questions at time of visit were answered - patient instructed to contact office with any additional concerns. ER/RTC precautions were reviewed with the patient and understanding verbalized.     Please note: voice recognition software was used to produce this document, and typos may escape review. Please contact Dr. Sheppard Coil for any needed clarifications.    Follow up plan: Return in about 3 months (around 07/01/2019) for Folow up on A1c, and less told otherwise by endocrinology.

## 2019-03-31 NOTE — Patient Instructions (Signed)
Plan:  Make sure the pharmacy knows to cancel the by durian and the Ozempic, I have routed a note to them but please double check.  We are starting a new injectable medication called Trulicity, this is also a once a week medicine.  Please contact your endocrinologist to schedule a follow-up.  They would be able to best answer the question about whether or not there may be anything going on as far as type 1 diabetes, and if there is anything about this that would change medication management.  I sent antibiotics in for ear/sinus infection.  If you continue to experience infections here, we may need to consult with ENT specialist.

## 2019-04-21 ENCOUNTER — Other Ambulatory Visit: Payer: Self-pay | Admitting: Osteopathic Medicine

## 2019-04-26 ENCOUNTER — Encounter: Payer: Self-pay | Admitting: Osteopathic Medicine

## 2019-04-28 MED ORDER — CHANTIX STARTING MONTH PAK 0.5 MG X 11 & 1 MG X 42 PO TABS: ORAL_TABLET | ORAL | 0 refills | Status: DC

## 2019-04-28 MED ORDER — VARENICLINE TARTRATE 1 MG PO TABS: 1.0000 mg | ORAL_TABLET | Freq: Two times a day (BID) | ORAL | 1 refills | Status: DC

## 2019-05-04 ENCOUNTER — Other Ambulatory Visit: Payer: Self-pay | Admitting: Osteopathic Medicine

## 2019-05-04 DIAGNOSIS — F419 Anxiety disorder, unspecified: Secondary | ICD-10-CM

## 2019-05-12 ENCOUNTER — Other Ambulatory Visit: Payer: Self-pay | Admitting: Osteopathic Medicine

## 2019-05-12 DIAGNOSIS — F419 Anxiety disorder, unspecified: Secondary | ICD-10-CM

## 2019-05-21 ENCOUNTER — Other Ambulatory Visit: Payer: Self-pay | Admitting: Osteopathic Medicine

## 2019-05-24 NOTE — Telephone Encounter (Signed)
Can we call patient and see which doses she is taking, so I can refill the dose that what ever is easiest? Thanks!

## 2019-05-27 NOTE — Telephone Encounter (Signed)
Called patient and LM on VM to return call to let us know what dose she is taking. KG LPN

## 2019-07-14 ENCOUNTER — Other Ambulatory Visit: Payer: Self-pay

## 2019-07-14 ENCOUNTER — Ambulatory Visit (INDEPENDENT_AMBULATORY_CARE_PROVIDER_SITE_OTHER): Payer: Medicaid Other | Admitting: Osteopathic Medicine

## 2019-07-14 ENCOUNTER — Encounter: Payer: Self-pay | Admitting: Osteopathic Medicine

## 2019-07-14 VITALS — HR 104

## 2019-07-14 DIAGNOSIS — F39 Unspecified mood [affective] disorder: Secondary | ICD-10-CM | POA: Insufficient documentation

## 2019-07-14 DIAGNOSIS — G43709 Chronic migraine without aura, not intractable, without status migrainosus: Secondary | ICD-10-CM

## 2019-07-14 DIAGNOSIS — F419 Anxiety disorder, unspecified: Secondary | ICD-10-CM

## 2019-07-14 MED ORDER — AIMOVIG 70 MG/ML ~~LOC~~ SOAJ: 70.0000 mg | SUBCUTANEOUS | 1 refills | Status: DC

## 2019-07-14 MED ORDER — SUMATRIPTAN SUCCINATE 50 MG PO TABS: 25.0000 mg | ORAL_TABLET | ORAL | 0 refills | Status: DC | PRN

## 2019-07-14 MED ORDER — ESCITALOPRAM OXALATE 10 MG PO TABS: 10.0000 mg | ORAL_TABLET | Freq: Every day | ORAL | 3 refills | Status: DC

## 2019-07-14 MED ORDER — ESCITALOPRAM OXALATE 5 MG PO TABS: 5.0000 mg | ORAL_TABLET | Freq: Every day | ORAL | 3 refills | Status: DC

## 2019-07-14 MED ORDER — ARIPIPRAZOLE 10 MG PO TABS: 10.0000 mg | ORAL_TABLET | Freq: Every day | ORAL | 0 refills | Status: DC

## 2019-07-14 NOTE — Progress Notes (Signed)
Virtual Visit via Video (App used: Doximity) Note  I connected with      Rachael Fowler on 07/14/19 at 9:19 AM by a telemedicine application and verified that I am speaking with the correct person using two identifiers.  Patient is at home I am in office    I discussed the limitations of evaluation and management by telemedicine and the availability of in person appointments. The patient expressed understanding and agreed to proceed.  History of Present Illness: Rachael Fowler is a 33 y.o. female who would like to discuss migraines and depression    Migraines:  . Location: L side, frontal is worse, tightness in neck as well . Quality: throbbing, tightness  . Severity: getting worse. . Duration/Timing: had had tension headaches on and off for years, 2 bad headaches in the past couple weeks . Modifying factors: worse w head movement, better w/ lying down. Hot shower will also help. Stress increased lately and this seems to make things worse  . Assoc signs/symptoms: nausea, sensitivity to light/sound w/ some headaches  . Mirena in place  Depression:  Leaxpro ok but still noting irritability and lots of mood swings, feeling down.      Observations/Objective: Pulse (!) 104  BP Readings from Last 3 Encounters:  03/31/19 140/90  12/10/18 124/87  11/12/18 (!) 131/91   Exam: Normal Speech.  NAD  Lab and Radiology Results No results found for this or any previous visit (from the past 72 hour(s)). No results found.     Assessment and Plan: 33 y.o. female with The primary encounter diagnosis was Chronic migraine without aura without status migrainosus, not intractable. Diagnoses of Anxiety and Mood disorder ([place]) were also pertinent to this visit.  Elavil and NSAID's and Excepdrin Migraine not helpful for headaches in the past. Will trial Imitrex prn (birth control confirmed) and will start ppx w/ injectible.   Will trial addition of Abilify for possible mood disorder / augment  depression treatment. Strong FH bipolar d/o.     PDMP not reviewed this encounter. No orders of the defined types were placed in this encounter.  Meds ordered this encounter  Medications  . SUMAtriptan (IMITREX) 50 MG tablet    Sig: Take 0.5-1 tablets (25-50 mg total) by mouth every 2 (two) hours as needed for migraine. May repeat in 2 hours if headache persists or recurs.    Dispense:  10 tablet    Refill:  0  . ARIPiprazole (ABILIFY) 10 MG tablet    Sig: Take 1 tablet (10 mg total) by mouth daily. Start w/ half tablet (5 mg total) for first 4-6 days    Dispense:  90 tablet    Refill:  0  . escitalopram (LEXAPRO) 5 MG tablet    Sig: Take 1 tablet (5 mg total) by mouth daily. Take with 10 mg tablets for total of 15 mg daily    Dispense:  90 tablet    Refill:  3  . escitalopram (LEXAPRO) 10 MG tablet    Sig: Take 1 tablet (10 mg total) by mouth at bedtime. Take w/ 5 mg tablet for total 15 mg daily    Dispense:  90 tablet    Refill:  3  . Erenumab-aooe (AIMOVIG) 70 MG/ML SOAJ    Sig: Inject 70 mg into the skin every 30 (thirty) days.    Dispense:  3 pen    Refill:  1    If Emgality or Ajovy covered cheaper, please let me know appropriate  comparable alternative. Thanks!    Follow Up Instructions: Return in about 4 weeks (around 08/11/2019) for RECHECK ON NEW MEDICATION FOR DEPRESSION, MIGRAINES. CALL SOONER IF NEEDED .    I discussed the assessment and treatment plan with the patient. The patient was provided an opportunity to ask questions and all were answered. The patient agreed with the plan and demonstrated an understanding of the instructions.   The patient was advised to call back or seek an in-person evaluation if any new concerns, if symptoms worsen or if the condition fails to improve as anticipated.  25 minutes of non-face-to-face time was provided during this encounter.                      Historical information moved to improve visibility of  documentation.  Past Medical History:  Diagnosis Date  . Asthma    Past Surgical History:  Procedure Laterality Date  . KNEE SURGERY  04/20/2006   Social History   Tobacco Use  . Smoking status: Current Every Day Smoker    Packs/day: 0.50  . Smokeless tobacco: Never Used  Substance Use Topics  . Alcohol use: Not on file   family history includes Cancer in her maternal grandmother and maternal uncle; Depression in her mother; Diabetes in her maternal uncle and mother; Hyperlipidemia in her father, maternal grandmother, mother, and paternal grandmother; Hypertension in her father, maternal grandmother, and mother.  Medications: Current Outpatient Medications  Medication Sig Dispense Refill  . Blood Glucose Monitoring Suppl (ACCU-CHEK AVIVA PLUS) w/Device KIT Accu-Chek Aviva Plus Meter  USE AS DIRECTED    . doxycycline (VIBRA-TABS) 100 MG tablet Take 1 tablet (100 mg total) by mouth 2 (two) times daily. 14 tablet 0  . Dulaglutide (TRULICITY) 5.42 HC/6.2BJ SOPN Inject 0.75 mg into the skin once a week. 4 pen 11  . escitalopram (LEXAPRO) 10 MG tablet TAKE 1 TABLET BY MOUTH EVERYDAY AT BEDTIME 90 tablet 0  . escitalopram (LEXAPRO) 5 MG tablet TAKE 1 TABLET (5 MG TOTAL) BY MOUTH DAILY. TAKE WITH 10 MG TABLETS FOR TOTAL OF 15 MG DAILY 90 tablet 0  . ibuprofen (ADVIL) 800 MG tablet TAKE 1 TABLET BY MOUTH EVERY 8 HOURS AS NEEDED 60 tablet 0  . insulin detemir (LEVEMIR) 100 UNIT/ML injection Inject into the skin.    . Insulin Pen Needle (BD PEN NEEDLE NANO U/F) 32G X 4 MM MISC Use with levemir daily    . Insulin Syringe-Needle U-100 31G X 15/64" 0.3 ML MISC by Does not apply route.    . lidocaine (XYLOCAINE) 2 % solution Use as directed 5-10 mLs in the mouth or throat every 3 (three) hours as needed. 100 mL 1  . loratadine (CLARITIN) 10 MG tablet TAKE 1 TABLET BY MOUTH EVERY DAY 30 tablet 5  . meloxicam (MOBIC) 15 MG tablet TAKE 1 TABLET (15 MG TOTAL) BY MOUTH DAILY. AS NEEDED FOR ACHES/PAINS  30 tablet 2  . metFORMIN (GLUCOPHAGE XR) 500 MG 24 hr tablet Take 2 tablets (1,000 mg total) by mouth daily with breakfast. 180 tablet 1  . omeprazole (PRILOSEC) 10 MG capsule Take 10 mg by mouth daily.    . traZODone (DESYREL) 50 MG tablet TAKE 0.5-2 TABLETS (25-100 MG TOTAL) BY MOUTH AT BEDTIME AS NEEDED FOR SLEEP. 45 tablet 0  . varenicline (CHANTIX CONTINUING MONTH PAK) 1 MG tablet Take 1 tablet (1 mg total) by mouth 2 (two) times daily. (Patient not taking: Reported on 07/14/2019) 180 tablet 1  .  varenicline (CHANTIX STARTING MONTH PAK) 0.5 MG X 11 & 1 MG X 42 tablet Take one 0.5 mg tablet by mouth once daily for 3 days, then increase to one 0.5 mg tablet twice daily for 4 days, then increase to one 1 mg tablet twice daily. (Patient not taking: Reported on 07/14/2019) 42 tablet 0   No current facility-administered medications for this visit.    Allergies  Allergen Reactions  . Hydrocodone Itching and Nausea And Vomiting    Also nausea and vomiting   . Penicillins Itching, Nausea And Vomiting and Other (See Comments)  . Hydrocodone-Acetaminophen Itching and Nausea And Vomiting  . Penicillin G Itching and Nausea And Vomiting    PDMP not reviewed this encounter. No orders of the defined types were placed in this encounter.  No orders of the defined types were placed in this encounter.

## 2019-07-18 ENCOUNTER — Telehealth: Payer: Self-pay | Admitting: Osteopathic Medicine

## 2019-07-18 NOTE — Telephone Encounter (Signed)
-----   Message from Emeterio Reeve, DO sent at 07/14/2019  9:36 AM EDT ----- Follow Up Instructions: Return in about 4 weeks (around 08/11/2019) for RECHECK ON NEW MEDICATION FOR DEPRESSION, MIGRAINES. CALL SOONER IF NEEDED .

## 2019-07-18 NOTE — Telephone Encounter (Signed)
I called pt and left a voicemail for pt to call back and schedule a F/u in 4 weeks

## 2019-07-26 ENCOUNTER — Encounter: Payer: Self-pay | Admitting: Family Medicine

## 2019-07-26 ENCOUNTER — Other Ambulatory Visit: Payer: Self-pay

## 2019-07-26 ENCOUNTER — Ambulatory Visit (INDEPENDENT_AMBULATORY_CARE_PROVIDER_SITE_OTHER): Payer: Medicaid Other | Admitting: Family Medicine

## 2019-07-26 VITALS — BP 159/104 | HR 87 | Temp 98.3°F | Wt 220.0 lb

## 2019-07-26 DIAGNOSIS — G2589 Other specified extrapyramidal and movement disorders: Secondary | ICD-10-CM | POA: Diagnosis not present

## 2019-07-26 DIAGNOSIS — M7581 Other shoulder lesions, right shoulder: Secondary | ICD-10-CM | POA: Diagnosis not present

## 2019-07-26 MED ORDER — DICLOFENAC SODIUM 1 % TD GEL: 4.0000 g | Freq: Four times a day (QID) | TRANSDERMAL | 11 refills | Status: DC

## 2019-07-26 NOTE — Progress Notes (Signed)
Rachael Fowler is a 33 y.o. female who presents to [facility] today for right shoulder pain.  Patient has about 1 week history of right shoulder pain she denies any injury.  She notes pain is located at the right lateral upper arm and into the trapezius and area inferior to her shoulder blade on the right.  She does have some numbness extending past the elbows into the hand.  She denies any fevers chills nausea vomiting or diarrhea.  She cannot think of any change in activity or injury that may have caused this.  She thinks meloxicam makes it worse.  She has been taking Loxitane for knee pain but thinks has not helped her knees and may be made her shoulders worse.    ROS:  As above  Exam:  BP (!) 159/104   Pulse 87   Temp 98.3 F (36.8 C) (Oral)   Wt 220 lb (99.8 kg)   BMI 37.76 kg/m  Wt Readings from Last 5 Encounters:  07/26/19 220 lb (99.8 kg)  03/31/19 227 lb 9.6 oz (103.2 kg)  12/10/18 230 lb 12.8 oz (104.7 kg)  11/12/18 236 lb 6.4 oz (107.2 kg)  08/12/18 227 lb (103 kg)   General: Well Developed, well nourished, and in no acute distress.  Neuro/Psych: Alert and oriented x3, extra-ocular muscles intact, able to move all 4 extremities, sensation grossly intact. Skin: Warm and dry, no rashes noted.  Respiratory: Not using accessory muscles, speaking in full sentences, trachea midline.  Cardiovascular: Pulses palpable, no extremity edema. Abdomen: Does not appear distended. MSK:  C-spine: Nontender to spinal midline.  Tender palpation right trapezius. Normal cervical motion. Reflexes sensation and strength are intact bilateral upper extremities.  Right shoulder: Normal-appearing not particularly tender. Normal abduction range of motion however pain beyond 120 degrees. Normal external rotation range of motion. Internal rotation range of motion limited to lumbar spine. Positive Hawkins and Neer's test.  Positive empty can test.   Negative Yergason's and speeds test. Intact strength.  Left shoulder normal-appearing normal motion nontender negative impingement testing normal strength.      Assessment and Plan: 33 y.o. female with  Right shoulder pain.  Likely multifactorial.  Patient has evidence of rotator cuff impingement as well as trapezius dysfunction and periscapular dysfunction.  She may also have a cervical radicular component as well.  Discussed options.  Plan for trial of home exercise program and diclofenac gel.  She does not have great childcare options therefore physical therapy will be very challenging.  If not improving in the next few weeks we will proceed with injection and likely x-ray.   PDMP not reviewed this encounter. No orders of the defined types were placed in this encounter.  Meds ordered this encounter  Medications  . diclofenac sodium (VOLTAREN) 1 % GEL    Sig: Apply 4 g topically 4 (four) times daily. To affected joint.    Dispense:  100 g    Refill:  11    Historical information moved to improve visibility of documentation.  Past Medical History:  Diagnosis Date  . Asthma    Past Surgical History:  Procedure Laterality Date  . KNEE SURGERY  04/20/2006   Social History   Tobacco Use  . Smoking status: Current Every Day Smoker    Packs/day: 0.50  . Smokeless tobacco: Never Used  Substance Use Topics  . Alcohol use: Not on file   family history includes Cancer in her maternal grandmother and maternal  uncle; Depression in her mother; Diabetes in her maternal uncle and mother; Hyperlipidemia in her father, maternal grandmother, mother, and paternal grandmother; Hypertension in her father, maternal grandmother, and mother.  Medications: Current Outpatient Medications  Medication Sig Dispense Refill  . ARIPiprazole (ABILIFY) 10 MG tablet Take 1 tablet (10 mg total) by mouth daily. Start w/ half tablet (5 mg total) for first 4-6 days 90 tablet 0  . Blood Glucose Monitoring  Suppl (ACCU-CHEK AVIVA PLUS) w/Device KIT Accu-Chek Aviva Plus Meter  USE AS DIRECTED    . diclofenac sodium (VOLTAREN) 1 % GEL Apply 4 g topically 4 (four) times daily. To affected joint. 100 g 11  . Dulaglutide (TRULICITY) 8.46 KZ/9.9JT SOPN Inject 0.75 mg into the skin once a week. 4 pen 11  . Erenumab-aooe (AIMOVIG) 70 MG/ML SOAJ Inject 70 mg into the skin every 30 (thirty) days. 3 pen 1  . escitalopram (LEXAPRO) 10 MG tablet Take 1 tablet (10 mg total) by mouth at bedtime. Take w/ 5 mg tablet for total 15 mg daily 90 tablet 3  . escitalopram (LEXAPRO) 5 MG tablet Take 1 tablet (5 mg total) by mouth daily. Take with 10 mg tablets for total of 15 mg daily 90 tablet 3  . ibuprofen (ADVIL) 800 MG tablet TAKE 1 TABLET BY MOUTH EVERY 8 HOURS AS NEEDED 60 tablet 0  . insulin detemir (LEVEMIR) 100 UNIT/ML injection Inject into the skin.    . Insulin Pen Needle (BD PEN NEEDLE NANO U/F) 32G X 4 MM MISC Use with levemir daily    . Insulin Syringe-Needle U-100 31G X 15/64" 0.3 ML MISC by Does not apply route.    . lidocaine (XYLOCAINE) 2 % solution Use as directed 5-10 mLs in the mouth or throat every 3 (three) hours as needed. 100 mL 1  . loratadine (CLARITIN) 10 MG tablet TAKE 1 TABLET BY MOUTH EVERY DAY 30 tablet 5  . metFORMIN (GLUCOPHAGE XR) 500 MG 24 hr tablet Take 2 tablets (1,000 mg total) by mouth daily with breakfast. 180 tablet 1  . omeprazole (PRILOSEC) 10 MG capsule Take 10 mg by mouth daily.    . SUMAtriptan (IMITREX) 50 MG tablet Take 0.5-1 tablets (25-50 mg total) by mouth every 2 (two) hours as needed for migraine. May repeat in 2 hours if headache persists or recurs. 10 tablet 0  . traZODone (DESYREL) 50 MG tablet TAKE 0.5-2 TABLETS (25-100 MG TOTAL) BY MOUTH AT BEDTIME AS NEEDED FOR SLEEP. 45 tablet 0   No current facility-administered medications for this visit.    Allergies  Allergen Reactions  . Hydrocodone Itching and Nausea And Vomiting    Also nausea and vomiting   .  Penicillins Itching, Nausea And Vomiting and Other (See Comments)  . Hydrocodone-Acetaminophen Itching and Nausea And Vomiting  . Penicillin G Itching and Nausea And Vomiting      Discussed warning signs or symptoms. Please see discharge instructions. Patient expresses understanding.

## 2019-07-26 NOTE — Patient Instructions (Addendum)
Thank you for coming in today. Lets try some home PT and home exercises.  Use the diclofenac gel.  With elbow straight up to the front and up to the side Internal rotation and external rotation (in and out) remember to keep the elbow tucked.  Arms back and to the side.  30 reps 2-3x daily.   If not getting better next step is injection in 3-4 weeks.    Ok to rest on throw pillow.     Shoulder Impingement Syndrome Rehab Ask your health care provider which exercises are safe for you. Do exercises exactly as told by your health care provider and adjust them as directed. It is normal to feel mild stretching, pulling, tightness, or discomfort as you do these exercises. Stop right away if you feel sudden pain or your pain gets worse. Do not begin these exercises until told by your health care provider. Stretching and range-of-motion exercise This exercise warms up your muscles and joints and improves the movement and flexibility of your shoulder. This exercise also helps to relieve pain and stiffness. Passive horizontal adduction In passive adduction, you use your other hand to move the injured arm toward your body. The injured arm does not move on its own. In this movement, your arm is moved across your body in the horizontal plane (horizontal adduction). 1. Sit or stand and pull your left / right elbow across your chest, toward your other shoulder. Stop when you feel a gentle stretch in the back of your shoulder and upper arm. ? Keep your arm at shoulder height. ? Keep your arm as close to your body as you comfortably can. 2. Hold for __________ seconds. 3. Slowly return to the starting position. Repeat __________ times. Complete this exercise __________ times a day. Strengthening exercises These exercises build strength and endurance in your shoulder. Endurance is the ability to use your muscles for a long time, even after they get tired. External rotation, isometric This is an exercise in  which you press the back of your wrist against a door frame without moving your shoulder joint (isometric). 1. Stand or sit in a doorway, facing the door frame. 2. Bend your left / right elbow and place the back of your wrist against the door frame. Only the back of your wrist should be touching the frame. Keep your upper arm at your side. 3. Gently press your wrist against the door frame, as if you are trying to push your arm away from your abdomen (external rotation). Press as hard as you are able without pain. ? Avoid shrugging your shoulder while you press your wrist against the door frame. Keep your shoulder blade tucked down toward the middle of your back. 4. Hold for __________ seconds. 5. Slowly release the tension, and relax your muscles completely before you repeat the exercise. Repeat __________ times. Complete this exercise __________ times a day. Internal rotation, isometric This is an exercise in which you press your palm against a door frame without moving your shoulder joint (isometric). 1. Stand or sit in a doorway, facing the door frame. 2. Bend your left / right elbow and place the palm of your hand against the door frame. Only your palm should be touching the frame. Keep your upper arm at your side. 3. Gently press your hand against the door frame, as if you are trying to push your arm toward your abdomen (internal rotation). Press as hard as you are able without pain. ? Avoid shrugging your shoulder  while you press your hand against the door frame. Keep your shoulder blade tucked down toward the middle of your back. 4. Hold for __________ seconds. 5. Slowly release the tension, and relax your muscles completely before you repeat the exercise. Repeat __________ times. Complete this exercise __________ times a day. Scapular protraction, supine  1. Lie on your back on a firm surface (supine position). Hold a __________ weight in your left / right hand. 2. Raise your left / right  arm straight into the air so your hand is directly above your shoulder joint. 3. Push the weight into the air so your shoulder (scapula) lifts off the surface that you are lying on. The scapula will push up or forward (protraction). Do not move your head, neck, or back. 4. Hold for __________ seconds. 5. Slowly return to the starting position. Let your muscles relax completely before you repeat this exercise. Repeat __________ times. Complete this exercise __________ times a day. Scapular retraction  1. Sit in a stable chair without armrests, or stand up. 2. Secure an exercise band to a stable object in front of you so the band is at shoulder height. 3. Hold one end of the exercise band in each hand. Your palms should face down. 4. Squeeze your shoulder blades together (retraction) and move your elbows slightly behind you. Do not shrug your shoulders upward while you do this. 5. Hold for __________ seconds. 6. Slowly return to the starting position. Repeat __________ times. Complete this exercise __________ times a day. Shoulder extension  1. Sit in a stable chair without armrests, or stand up. 2. Secure an exercise band to a stable object in front of you so the band is above shoulder height. 3. Hold one end of the exercise band in each hand. 4. Straighten your elbows and lift your hands up to shoulder height. 5. Squeeze your shoulder blades together and pull your hands down to the sides of your thighs (extension). Stop when your hands are straight down by your sides. Do not let your hands go behind your body. 6. Hold for __________ seconds. 7. Slowly return to the starting position. Repeat __________ times. Complete this exercise __________ times a day. This information is not intended to replace advice given to you by your health care provider. Make sure you discuss any questions you have with your health care provider. Document Released: 10/13/2005 Document Revised: 02/04/2019 Document  Reviewed: 11/08/2018 Elsevier Patient Education  2020 [employer].

## 2019-08-02 ENCOUNTER — Ambulatory Visit (INDEPENDENT_AMBULATORY_CARE_PROVIDER_SITE_OTHER): Payer: Medicaid Other | Admitting: Family Medicine

## 2019-08-02 ENCOUNTER — Other Ambulatory Visit: Payer: Self-pay

## 2019-08-02 VITALS — BP 122/77 | HR 110 | Wt 220.0 lb

## 2019-08-02 DIAGNOSIS — M5412 Radiculopathy, cervical region: Secondary | ICD-10-CM | POA: Diagnosis not present

## 2019-08-02 DIAGNOSIS — M791 Myalgia, unspecified site: Secondary | ICD-10-CM | POA: Diagnosis not present

## 2019-08-02 MED ORDER — PREGABALIN 75 MG PO CAPS: 75.0000 mg | ORAL_CAPSULE | Freq: Two times a day (BID) | ORAL | 1 refills | Status: DC

## 2019-08-02 NOTE — Patient Instructions (Signed)
Thank you for coming in today. I think the pain may be a pinched nerve in your neck.  It is possible it is due to Abilify.  Ok to try stopping the abilify for 2 weeks then restarting.  Get labs.  Start lyrica.  If not improving next step is prednisone. I will prescribe it with seroquel to take at bedtime to help prevent irritability and insomnia with prednisone.   Keep me updated.    Cervical Radiculopathy  Cervical radiculopathy happens when a nerve in the neck (a cervical nerve) is pinched or bruised. This condition can happen because of an injury to the cervical spine (vertebrae) in the neck, or as part of the normal aging process. Pressure on the cervical nerves can cause pain or numbness that travels from the neck all the way down into the arm and fingers. Usually, this condition gets better with rest. Treatment may be needed if the condition does not improve. What are the causes? This condition may be caused by:  A neck injury.  A bulging (herniated) disk.  Muscle spasms.  Muscle tightness in the neck because of overuse.  Arthritis.  Breakdown or degeneration in the bones and joints of the spine (spondylosis) due to aging.  Bone spurs that may develop near the cervical nerves. What are the signs or symptoms? Symptoms of this condition include:  Pain. The pain may travel from the neck to the arm and hand. The pain can be severe or irritating. It may be worse when you move your neck.  Numbness or tingling in your arm or hand.  Weakness in the affected arm and hand, in severe cases. How is this diagnosed? This condition may be diagnosed based on your symptoms, your medical history, and a physical exam. You may also have tests, including:  X-rays.  A CT scan.  An MRI.  An electromyogram (EMG).  Nerve conduction tests. How is this treated? In many cases, treatment is not needed for this condition. With rest, the condition usually gets better over time. If treatment  is needed, options may include:  Wearing a soft neck collar (cervical collar) for short periods of time, as told by your health care provider.  Doing physical therapy to strengthen your neck muscles.  Taking medicines, such as NSAIDs or oral corticosteroids.  Having spinal injections, in severe cases.  Having surgery. This may be needed if other treatments do not help. Different types of surgery may be done depending on the cause of this condition. Follow these instructions at home: If you have a cervical collar:  Wear it as told by your health care provider. Remove it only as told by your health care provider.  Ask your health care provider if you can remove the collar for cleaning and bathing. If you are allowed to remove the collar for cleaning or bathing: ? Follow instructions from your health care provider about how to remove the collar safely. ? Clean the collar by wiping it with mild soap and water and drying it completely. ? Take out any removable pads in the collar every 1-2 days, and wash them by hand with soap and water. Let them air-dry completely before you put them back in the collar. ? Check your skin under the collar for irritation or sores. If you see any, tell your health care provider. Managing pain      Take over-the-counter and prescription medicines only as told by your health care provider.  If directed, put ice on the affected  area. ? If you have a soft neck collar, remove it as told by your health care provider. ? Put ice in a plastic bag. ? Place a towel between your skin and the bag. ? Leave the ice on for 20 minutes, 2-3 times a day.  If applying ice does not help, you can try using heat. Use the heat source that your health care provider recommends, such as a moist heat pack or a heating pad. ? Place a towel between your skin and the heat source. ? Leave the heat on for 20-30 minutes. ? Remove the heat if your skin turns bright red. This is especially  important if you are unable to feel pain, heat, or cold. You may have a greater risk of getting burned.  Try a gentle neck and shoulder massage to help relieve symptoms. Activity  Rest as needed.  Return to your normal activities as told by your health care provider. Ask your health care provider what activities are safe for you.  Do stretching and strengthening exercises as told by your health care provider or physical therapist.  Do not lift anything that is heavier than 10 lb (4.5 kg) until your health care provider tells you that it is safe. General instructions  Use a flat pillow when you sleep.  Do not drive while wearing a cervical collar. If you do not have a cervical collar, ask your health care provider if it is safe to drive while your neck heals.  Ask your health care provider if the medicine prescribed to you requires you to avoid driving or using heavy machinery.  Do not use any products that contain nicotine or tobacco, such as cigarettes, e-cigarettes, and chewing tobacco. These can delay healing. If you need help quitting, ask your health care provider.  Keep all follow-up visits as told by your health care provider. This is important. Contact a health care provider if:  Your condition does not improve with treatment. Get help right away if:  Your pain gets much worse and cannot be controlled with medicines.  You have weakness or numbness in your hand, arm, face, or leg.  You have a high fever.  You have a stiff, rigid neck.  You lose control of your bowels or your bladder (have incontinence).  You have trouble with walking, balance, or speaking. Summary  Cervical radiculopathy happens when a nerve in the neck is pinched or bruised.  A nerve can get pinched from a bulging disk, arthritis, muscle spasms, or an injury to the neck.  Symptoms include pain, tingling, or numbness radiating from the neck into the arm or hand. Weakness can also occur in severe  cases.  Treatment may include rest, wearing a cervical collar, and physical therapy. Medicines may be prescribed to help with pain. In severe cases, injections or surgery may be needed. This information is not intended to replace advice given to you by your health care provider. Make sure you discuss any questions you have with your health care provider. Document Released: 07/08/2001 Document Revised: 09/03/2018 Document Reviewed: 09/03/2018 Elsevier Patient Education  2020 [employer].

## 2019-08-02 NOTE — Progress Notes (Signed)
Rachael Fowler is a 33 y.o. female who presents to [facility] today for bilateral arm pain.  Patient was seen on September 29.  At that time she had shoulder pain for about 1 week.  Pain was thought to be multifactorial rotator cuff impingement as well as trapezius dysfunction and periscapular dysfunction.  Plan for trial of home exercise and diclofenac gel.  Physical therapy was challenging for her given her childcare arrangements.  She notes that since he has been seen she is continued to experience bilateral trapezius pain and pain in her periscapular region.  Additionally has pain radiating down her arms.  She notes some hand numbness as well.  She is not sure where in her hand the numbness occurs. She is done some reading and is concerned that her symptoms may be secondary to side effect of Abilify which was somewhat recently started.  She stopped Abilify yesterday thinking that might be the cause.  She notes it is been too soon to tell if it has helped or not.  She also notes that she has been seen an endocrinologist for her diabetes.  Her blood sugars are now under the 250 but still above 200 most of the time.  She is taking Trulicity and Levemir insulin.    ROS:  As above  Exam:  BP 122/77   Pulse (!) 110   Wt 220 lb (99.8 kg)   BMI 37.76 kg/m  Wt Readings from Last 5 Encounters:  08/02/19 220 lb (99.8 kg)  07/26/19 220 lb (99.8 kg)  03/31/19 227 lb 9.6 oz (103.2 kg)  12/10/18 230 lb 12.8 oz (104.7 kg)  11/12/18 236 lb 6.4 oz (107.2 kg)   General: Well Developed, well nourished, and in no acute distress.  Neuro/Psych: Alert and oriented x3, extra-ocular muscles intact, able to move all 4 extremities, sensation grossly intact. Skin: Warm and dry, no rashes noted.  Respiratory: Not using accessory muscles, speaking in full sentences, trachea midline.  Cardiovascular: Pulses palpable, no extremity edema. Abdomen: Does not appear  distended. MSK:  C-spine: Nontender to spinal midline normal cervical motion. Nontender paraspinal muscles. Tender palpation bilateral trapezius. Positive Spurling's test with cervical motion. Upper extremity strength reflexes and sensation are equal normal throughout.  Right shoulder normal-appearing Tender to palpation periscapular muscles. Nontender otherwise. Normal shoulder motion. Negative Hawkins Neer's and empty can test. Negative Yergason's and speeds test. Intact strength.  Left shoulder: Normal-appearing tender palpation periscapular musculature. Normal shoulder motion. Negative Hawkins Neer's and empty can test. Negative Yergason's and speeds test. Intact strength.  Pulses cap refill and sensation are intact distally.    Assessment and Plan: 33 y.o. female with  Bilateral shoulder pain.  Etiology is somewhat unclear.  Her pain certainly could be cervical related.  She also has symptoms of cervical radiculopathy mildly.  It is possible this is secondary to medication side effect.  Plan to hold off on Abilify for at least 1 to 2 weeks and try restarting.  In the meantime we will treat for possible cervical radicular pain with Lyrica.  She has had gabapentin in the past and was intolerant of it.  Would like to avoid prednisone if possible secondary to diabetes however if not improving next that would be trial of prednisone.  We will go ahead and prescribe with Seroquel to prevent severe irritability or mania with her mental health history.  If not improving would consider further work-up including imaging.  However we will proceed with work-up  for myalgias today with CBC metabolic panel CK and sed rate.   PDMP not reviewed this encounter. Orders Placed This Encounter  Procedures  . CBC  . COMPLETE METABOLIC PANEL WITH GFR  . CK  . Sedimentation rate   Meds ordered this encounter  Medications  . pregabalin (LYRICA) 75 MG capsule    Sig: Take 1 capsule (75 mg  total) by mouth 2 (two) times daily.    Dispense:  60 capsule    Refill:  1    Historical information moved to improve visibility of documentation.  Past Medical History:  Diagnosis Date  . Asthma    Past Surgical History:  Procedure Laterality Date  . KNEE SURGERY  04/20/2006   Social History   Tobacco Use  . Smoking status: Current Every Day Smoker    Packs/day: 0.50  . Smokeless tobacco: Never Used  Substance Use Topics  . Alcohol use: Not on file   family history includes Cancer in her maternal grandmother and maternal uncle; Depression in her mother; Diabetes in her maternal uncle and mother; Hyperlipidemia in her father, maternal grandmother, mother, and paternal grandmother; Hypertension in her father, maternal grandmother, and mother.  Medications: Current Outpatient Medications  Medication Sig Dispense Refill  . lisinopril (ZESTRIL) 10 MG tablet Take 1 tablet by mouth daily.    . ARIPiprazole (ABILIFY) 10 MG tablet Take 1 tablet (10 mg total) by mouth daily. Start w/ half tablet (5 mg total) for first 4-6 days 90 tablet 0  . Blood Glucose Monitoring Suppl (ACCU-CHEK AVIVA PLUS) w/Device KIT Accu-Chek Aviva Plus Meter  USE AS DIRECTED    . diclofenac sodium (VOLTAREN) 1 % GEL Apply 4 g topically 4 (four) times daily. To affected joint. 100 g 11  . Dulaglutide (TRULICITY) 2.95 JO/8.4ZY SOPN Inject 0.75 mg into the skin once a week. 4 pen 11  . Erenumab-aooe (AIMOVIG) 70 MG/ML SOAJ Inject 70 mg into the skin every 30 (thirty) days. 3 pen 1  . escitalopram (LEXAPRO) 10 MG tablet Take 1 tablet (10 mg total) by mouth at bedtime. Take w/ 5 mg tablet for total 15 mg daily 90 tablet 3  . escitalopram (LEXAPRO) 5 MG tablet Take 1 tablet (5 mg total) by mouth daily. Take with 10 mg tablets for total of 15 mg daily 90 tablet 3  . ibuprofen (ADVIL) 800 MG tablet TAKE 1 TABLET BY MOUTH EVERY 8 HOURS AS NEEDED 60 tablet 0  . insulin detemir (LEVEMIR) 100 UNIT/ML injection Inject into  the skin.    . Insulin Pen Needle (BD PEN NEEDLE NANO U/F) 32G X 4 MM MISC Use with levemir daily    . Insulin Syringe-Needle U-100 31G X 15/64" 0.3 ML MISC by Does not apply route.    . lidocaine (XYLOCAINE) 2 % solution Use as directed 5-10 mLs in the mouth or throat every 3 (three) hours as needed. 100 mL 1  . loratadine (CLARITIN) 10 MG tablet TAKE 1 TABLET BY MOUTH EVERY DAY 30 tablet 5  . metFORMIN (GLUCOPHAGE XR) 500 MG 24 hr tablet Take 2 tablets (1,000 mg total) by mouth daily with breakfast. 180 tablet 1  . omeprazole (PRILOSEC) 10 MG capsule Take 10 mg by mouth daily.    . pregabalin (LYRICA) 75 MG capsule Take 1 capsule (75 mg total) by mouth 2 (two) times daily. 60 capsule 1  . SUMAtriptan (IMITREX) 50 MG tablet Take 0.5-1 tablets (25-50 mg total) by mouth every 2 (two) hours as needed for  migraine. May repeat in 2 hours if headache persists or recurs. 10 tablet 0  . traZODone (DESYREL) 50 MG tablet TAKE 0.5-2 TABLETS (25-100 MG TOTAL) BY MOUTH AT BEDTIME AS NEEDED FOR SLEEP. 45 tablet 0   No current facility-administered medications for this visit.    Allergies  Allergen Reactions  . Hydrocodone Itching and Nausea And Vomiting    Also nausea and vomiting   . Penicillins Itching, Nausea And Vomiting and Other (See Comments)  . Hydrocodone-Acetaminophen Itching and Nausea And Vomiting  . Penicillin G Itching and Nausea And Vomiting      Discussed warning signs or symptoms. Please see discharge instructions. Patient expresses understanding.

## 2019-08-03 LAB — COMPLETE METABOLIC PANEL WITH GFR
AG Ratio: 1.8 (calc) (ref 1.0–2.5)
ALT: 31 U/L — ABNORMAL HIGH (ref 6–29)
AST: 33 U/L — ABNORMAL HIGH (ref 10–30)
Albumin: 4.4 g/dL (ref 3.6–5.1)
Alkaline phosphatase (APISO): 76 U/L (ref 31–125)
BUN: 11 mg/dL (ref 7–25)
CO2: 26 mmol/L (ref 20–32)
Calcium: 10.2 mg/dL (ref 8.6–10.2)
Chloride: 102 mmol/L (ref 98–110)
Creat: 0.64 mg/dL (ref 0.50–1.10)
GFR, Est African American: 138 mL/min/{1.73_m2} (ref >=60)
GFR, Est Non African American: 119 mL/min/{1.73_m2} (ref >=60)
Globulin: 2.5 g/dL (calc) (ref 1.9–3.7)
Glucose, Bld: 153 mg/dL — ABNORMAL HIGH (ref 65–99)
Potassium: 4.7 mmol/L (ref 3.5–5.3)
Sodium: 139 mmol/L (ref 135–146)
Total Bilirubin: 0.3 mg/dL (ref 0.2–1.2)
Total Protein: 6.9 g/dL (ref 6.1–8.1)

## 2019-08-03 LAB — CBC
HCT: 44.5 % (ref 35.0–45.0)
Hemoglobin: 15.2 g/dL (ref 11.7–15.5)
MCH: 29.9 pg (ref 27.0–33.0)
MCHC: 34.2 g/dL (ref 32.0–36.0)
MCV: 87.6 fL (ref 80.0–100.0)
MPV: 10.3 fL (ref 7.5–12.5)
Platelets: 486 10*3/uL — ABNORMAL HIGH (ref 140–400)
RBC: 5.08 10*6/uL (ref 3.80–5.10)
RDW: 12.7 % (ref 11.0–15.0)
WBC: 16.7 10*3/uL — ABNORMAL HIGH (ref 3.8–10.8)

## 2019-08-03 LAB — CK: Total CK: 72 U/L (ref 29–143)

## 2019-08-03 LAB — SEDIMENTATION RATE: Sed Rate: 11 mm/h (ref 0–20)

## 2019-08-06 ENCOUNTER — Encounter: Payer: Self-pay | Admitting: Family Medicine

## 2019-08-08 MED ORDER — QUETIAPINE FUMARATE 100 MG PO TABS: 100.0000 mg | ORAL_TABLET | Freq: Every day | ORAL | 1 refills | Status: DC

## 2019-08-21 ENCOUNTER — Other Ambulatory Visit: Payer: Self-pay | Admitting: Osteopathic Medicine

## 2019-08-22 NOTE — Telephone Encounter (Signed)
Requested medication (s) are due for refill today: no  Requested medication (s) are on the active medication list: yes  Last refill:  06/18/2019  Future visit scheduled: no  Notes to clinic:  Last filled by historical provider   Requested Prescriptions  Pending Prescriptions Disp Refills   omeprazole (PRILOSEC) 10 MG capsule [Pharmacy Med Name: OMEPRAZOLE DR 10 MG CAPSULE] 30 capsule 11    Sig: TAKE [place]     Gastroenterology: Proton Pump Inhibitors Passed - 08/21/2019  2:27 PM      Passed - Valid encounter within last 12 months    Recent Outpatient Visits          2 weeks ago [facility], Rebekah [place], MD   3 weeks ago Tendinitis of right rotator cuff   [facility], Rebekah [place], MD   1 month ago Chronic migraine without aura without status migrainosus, not intractable   [facility], Lanelle Bal, DO   4 months ago Type 2 diabetes mellitus without complication, without long-term current use of insulin [facility])   [facility], Lanelle Bal, DO   5 months ago Pain, dental   [facility], [place], DO

## 2019-08-22 NOTE — Telephone Encounter (Signed)
[facility] requesting med refill for omeprazole. Rx written by historical provider.

## 2019-08-23 ENCOUNTER — Telehealth: Payer: Self-pay | Admitting: *Deleted

## 2019-08-23 ENCOUNTER — Encounter: Payer: Medicaid Other | Admitting: Obstetrics and Gynecology

## 2019-08-23 NOTE — Telephone Encounter (Signed)
Left patient a message to call and answer screening questions prior to appointment on 08/23/2019 at 3:00pm with a 2:45pm arrival.

## 2019-08-24 ENCOUNTER — Encounter: Payer: Self-pay | Admitting: Family Medicine

## 2019-08-24 ENCOUNTER — Ambulatory Visit (INDEPENDENT_AMBULATORY_CARE_PROVIDER_SITE_OTHER): Payer: Medicaid Other | Admitting: Family Medicine

## 2019-08-24 ENCOUNTER — Encounter: Payer: Self-pay | Admitting: Osteopathic Medicine

## 2019-08-24 DIAGNOSIS — Z20822 Contact with and (suspected) exposure to covid-19: Secondary | ICD-10-CM

## 2019-08-24 DIAGNOSIS — R6883 Chills (without fever): Secondary | ICD-10-CM | POA: Diagnosis not present

## 2019-08-24 DIAGNOSIS — Z20828 Contact with and (suspected) exposure to other viral communicable diseases: Secondary | ICD-10-CM

## 2019-08-24 NOTE — Progress Notes (Signed)
Virtual Visit  via Video Note  I connected with      Rachael Fowler by a video enabled telemedicine application and verified that I am speaking with the correct person using two identifiers.   I discussed the limitations of evaluation and management by telemedicine and the availability of in person appointments. The patient expressed understanding and agreed to proceed.  History of Present Illness: Rachael Fowler is a 34 y.o. female who would like to discuss Covid exposure and mild chills.    Friday night was near friend(Rachael Fowler) who found out that one of his contacts tested positive for COVID.  Rachael Fowler is getting tested today but results are not back yet.  Patient was sitting on front porch talking with her friend but her fiance was in the car with [facility] for longer.    Rachael Fowler is having some chills now.  She feels well otherwise now.  No chest pain palpitation shortness of breath cough congestion shortness of breath..  Observations/Objective: Exam: Appearance nontoxic appearing Normal Speech.  Wheezing shortness of breath or tachypnea.  Able to complete sentences.    Assessment and Plan: 33 y.o. female with second-degree Covid exposure.  However patient is somewhat symptomatic now.  Reasonable to proceed with her on Covid testing.  Recommend self-isolation until test are back.  Precautions reviewed.  Recheck sooner if needed.  PDMP not reviewed this encounter. No orders of the defined types were placed in this encounter.  No orders of the defined types were placed in this encounter.   Follow Up Instructions:    I discussed the assessment and treatment plan with the patient. The patient was provided an opportunity to ask questions and all were answered. The patient agreed with the plan and demonstrated an understanding of the instructions.   The patient was advised to call back or seek an in-person evaluation if the symptoms worsen or if the condition fails to improve as  anticipated.  Time: 15 minutes of intraservice time, with >22 minutes of total time during today's visit.      Historical information moved to improve visibility of documentation.  Past Medical History:  Diagnosis Date  . Asthma    Past Surgical History:  Procedure Laterality Date  . KNEE SURGERY  04/20/2006   Social History   Tobacco Use  . Smoking status: Current Every Day Smoker    Packs/day: 0.50  . Smokeless tobacco: Never Used  Substance Use Topics  . Alcohol use: Not on file   family history includes Cancer in her maternal grandmother and maternal uncle; Depression in her mother; Diabetes in her maternal uncle and mother; Hyperlipidemia in her father, maternal grandmother, mother, and paternal grandmother; Hypertension in her father, maternal grandmother, and mother.  Medications: Current Outpatient Medications  Medication Sig Dispense Refill  . ARIPiprazole (ABILIFY) 10 MG tablet Take 1 tablet (10 mg total) by mouth daily. Start w/ half tablet (5 mg total) for first 4-6 days 90 tablet 0  . Blood Glucose Monitoring Suppl (ACCU-CHEK AVIVA PLUS) w/Device KIT Accu-Chek Aviva Plus Meter  USE AS DIRECTED    . diclofenac sodium (VOLTAREN) 1 % GEL Apply 4 g topically 4 (four) times daily. To affected joint. 100 g 11  . Dulaglutide (TRULICITY) 8.28 MK/3.4JZ SOPN Inject 0.75 mg into the skin once a week. 4 pen 11  . Erenumab-aooe (AIMOVIG) 70 MG/ML SOAJ Inject 70 mg into the skin every 30 (thirty) days. 3 pen 1  . escitalopram (LEXAPRO) 10 MG tablet Take 1  tablet (10 mg total) by mouth at bedtime. Take w/ 5 mg tablet for total 15 mg daily 90 tablet 3  . escitalopram (LEXAPRO) 5 MG tablet Take 1 tablet (5 mg total) by mouth daily. Take with 10 mg tablets for total of 15 mg daily 90 tablet 3  . ibuprofen (ADVIL) 800 MG tablet TAKE 1 TABLET BY MOUTH EVERY 8 HOURS AS NEEDED 60 tablet 0  . insulin detemir (LEVEMIR) 100 UNIT/ML injection Inject into the skin.    . Insulin Pen Needle  (BD PEN NEEDLE NANO U/F) 32G X 4 MM MISC Use with levemir daily    . Insulin Syringe-Needle U-100 31G X 15/64" 0.3 ML MISC by Does not apply route.    . lidocaine (XYLOCAINE) 2 % solution Use as directed 5-10 mLs in the mouth or throat every 3 (three) hours as needed. 100 mL 1  . lisinopril (ZESTRIL) 10 MG tablet Take 1 tablet by mouth daily.    [name] loratadine (CLARITIN) 10 MG tablet TAKE 1 TABLET BY MOUTH EVERY DAY 30 tablet 5  . metFORMIN (GLUCOPHAGE XR) 500 MG 24 hr tablet Take 2 tablets (1,000 mg total) by mouth daily with breakfast. 180 tablet 1  . omeprazole (PRILOSEC) 10 MG capsule TAKE 1 CAPSULE BY MOUTH EVERY DAY 90 capsule 3  . pregabalin (LYRICA) 75 MG capsule Take 1 capsule (75 mg total) by mouth 2 (two) times daily. 60 capsule 1  . QUEtiapine (SEROQUEL) 100 MG tablet Take 1 tablet (100 mg total) by mouth at bedtime. 30 tablet 1  . SUMAtriptan (IMITREX) 50 MG tablet Take 0.5-1 tablets (25-50 mg total) by mouth every 2 (two) hours as needed for migraine. May repeat in 2 hours if headache persists or recurs. 10 tablet 0  . traZODone (DESYREL) 50 MG tablet TAKE 0.5-2 TABLETS (25-100 MG TOTAL) BY MOUTH AT BEDTIME AS NEEDED FOR SLEEP. 45 tablet 0   No current facility-administered medications for this visit.    Allergies  Allergen Reactions  . Hydrocodone Itching and Nausea And Vomiting    Also nausea and vomiting   . Penicillins Itching, Nausea And Vomiting and Other (See Comments)  . Hydrocodone-Acetaminophen Itching and Nausea And Vomiting  . Penicillin G Itching and Nausea And Vomiting

## 2019-08-24 NOTE — Progress Notes (Signed)
Called the patient at 02:08 pm and left a message.

## 2019-09-01 IMAGING — DX DG FOOT 2V*L*
2 series · 2 of 2 positions shown · non-contrast
Comparison: None.

CLINICAL DATA: Bilateral heel pain for 3 days

EXAM:
LEFT FOOT - 2 VIEW

[foot ap]
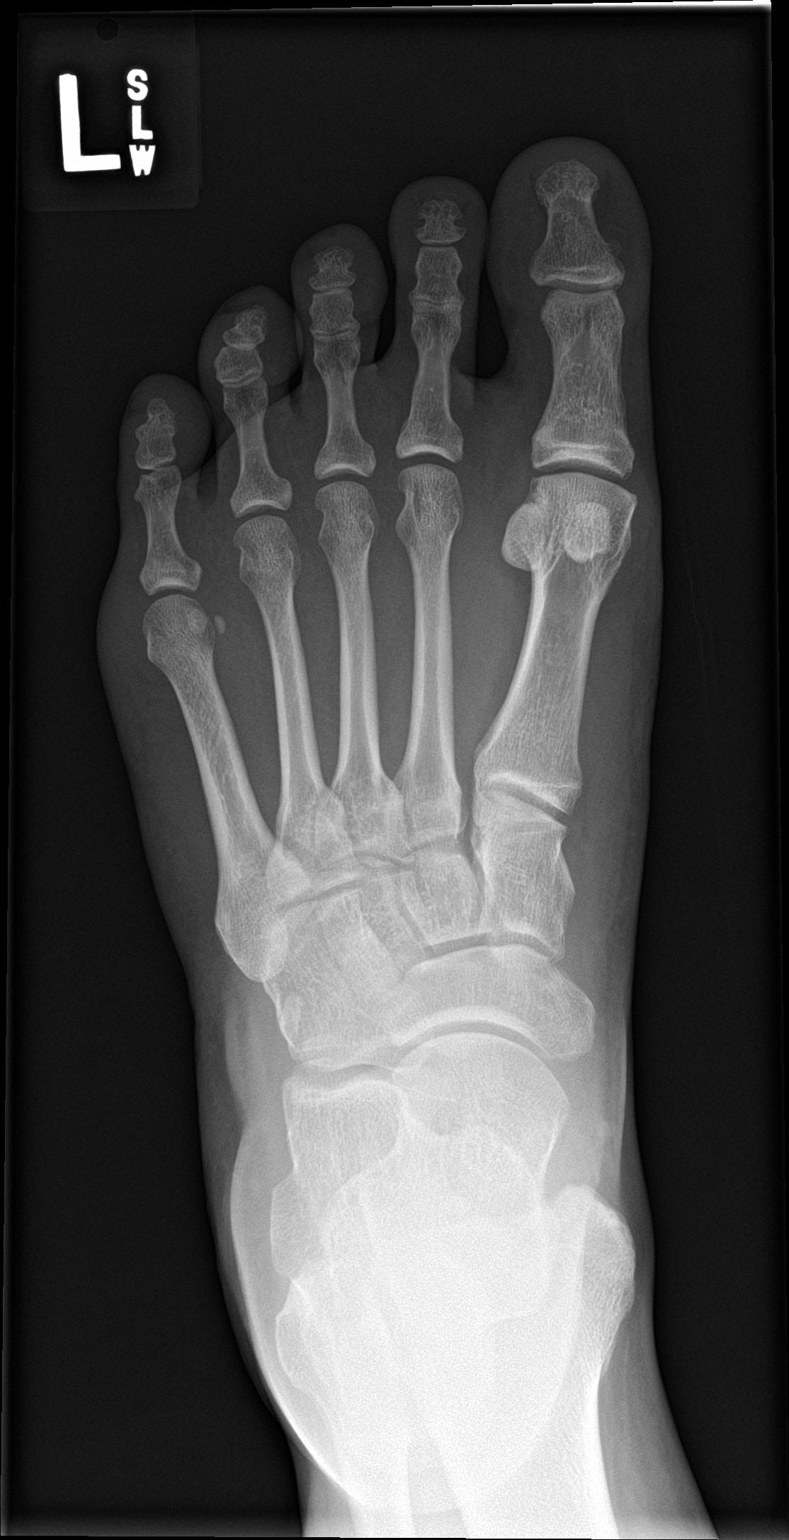

[foot lat]
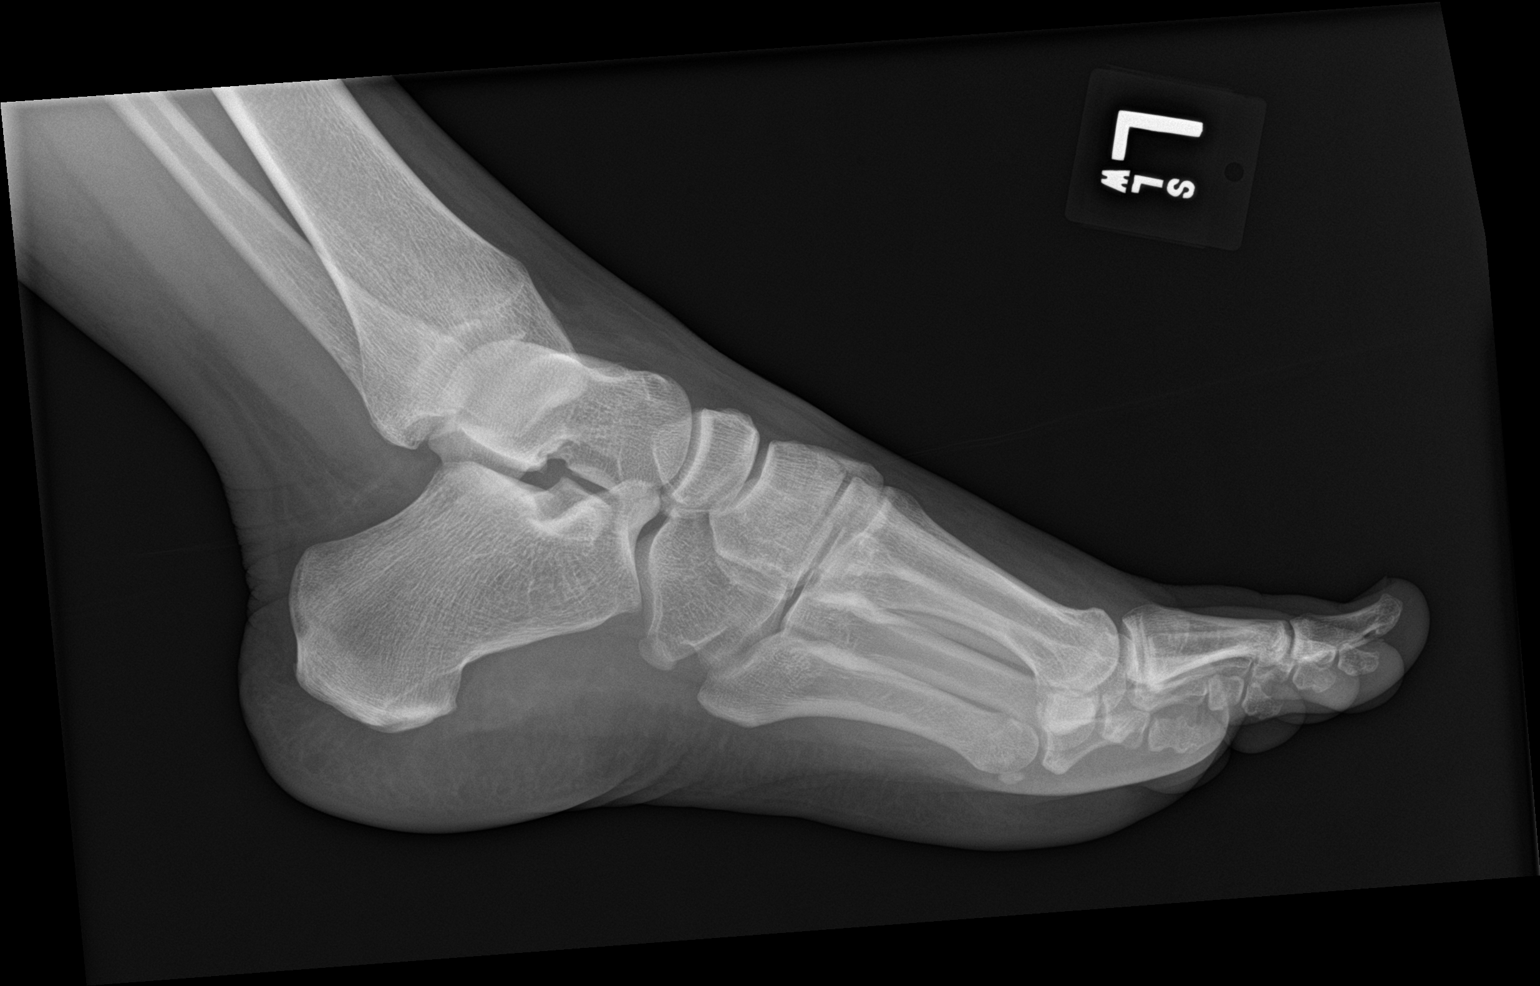

[2 of 2 positions shown; findings below may reference images not displayed]

FINDINGS: There is no evidence of fracture or dislocation. There is no
evidence of arthropathy or other focal bone abnormality.
IMPRESSION: Negative.

## 2019-09-02 ENCOUNTER — Encounter: Payer: Self-pay | Admitting: Emergency Medicine

## 2019-09-02 ENCOUNTER — Emergency Department (INDEPENDENT_AMBULATORY_CARE_PROVIDER_SITE_OTHER)
Admission: EM | Admit: 2019-09-02 | Discharge: 2019-09-02 | Disposition: A | Discharge status: Home / Self Care | Payer: Medicaid Other | Source: Home / Self Care | Attending: Family Medicine | Admitting: Family Medicine

## 2019-09-02 ENCOUNTER — Other Ambulatory Visit: Payer: Self-pay

## 2019-09-02 DIAGNOSIS — H6502 Acute serous otitis media, left ear: Secondary | ICD-10-CM | POA: Diagnosis not present

## 2019-09-02 DIAGNOSIS — R5383 Other fatigue: Secondary | ICD-10-CM

## 2019-09-02 DIAGNOSIS — J069 Acute upper respiratory infection, unspecified: Secondary | ICD-10-CM

## 2019-09-02 DIAGNOSIS — R0981 Nasal congestion: Secondary | ICD-10-CM | POA: Diagnosis not present

## 2019-09-02 DIAGNOSIS — R05 Cough: Secondary | ICD-10-CM

## 2019-09-02 MED ORDER — DOXYCYCLINE HYCLATE 100 MG PO CAPS: 100.0000 mg | ORAL_CAPSULE | Freq: Two times a day (BID) | ORAL | 0 refills | Status: DC

## 2019-09-02 MED ORDER — PREDNISONE 20 MG PO TABS: ORAL_TABLET | ORAL | 0 refills | Status: DC

## 2019-09-02 NOTE — ED Triage Notes (Signed)
Patient here for soreness along left neck nodes with some enlargement; and also pain in her left ear. She cannot tolerate tylenol or ibuprofen so has not taken OTCs. She was tested for COVID 6 days ago and was negative.  She has had her influenza vacc this season.

## 2019-09-02 NOTE — ED Provider Notes (Signed)
Rachael Fowler CARE    CSN: [id number] Arrival date & time: 09/02/19  1806      History   Chief Complaint Chief Complaint  Patient presents with  . Neck Pain  . Otalgia    HPI Rachael Fowler is a 33 y.o. female.   Patient complains of onset of fatigue, increased sinus congestion, and non-productive cough two weeks ago.  Two days ago she developed mild soreness in her left throat, soreness in her left neck, and left ear pain.  She denies pleuritic pain, shortness of breath, and fevers, chills, and sweats..  She has a past history of asthma, and multiple maternal relatives with asthma. She states that she had a negative COVID19 test six days ago.  The history is provided by the patient.    Past Medical History:  Diagnosis Date  . Asthma     Patient Active Problem List   Diagnosis Date Noted  . Mood disorder ([place]) 07/14/2019  . Patellofemoral syndrome, bilateral 08/17/2018  . Chronic bilateral low back pain without sciatica 08/17/2018  . Pain of left heel 07/14/2018  . Pain of right heel 07/14/2018  . Leukocytosis 07/14/2018  . Hirsutism 05/26/2018  . Abnormal WBC count 05/26/2018  . Psychophysiological insomnia 05/26/2018  . Anxiety and depression 05/26/2018  . Chronic tension-type headache, not intractable 05/26/2018  . Moderate persistent asthma with exacerbation 02/05/2018  . Type 2 diabetes mellitus without complication, without long-term current use of insulin ([place]) 08/19/2017  . Pregnancy 08/19/2017  . OTHER ACUTE REACTIONS TO STRESS 12/04/2009    Past Surgical History:  Procedure Laterality Date  . KNEE SURGERY  04/20/2006    OB History    Gravida  1   Para      Term      Preterm      AB      Living        SAB      TAB      Ectopic      Multiple      Live Births               Home Medications    Prior to Admission medications   Medication Sig Start Date End Date Taking? Authorizing Provider  ARIPiprazole (ABILIFY) 10 MG  tablet Take 1 tablet (10 mg total) by mouth daily. Start w/ half tablet (5 mg total) for first 4-6 days 07/14/19   Emeterio Reeve, DO  Blood Glucose Monitoring Suppl (ACCU-CHEK AVIVA PLUS) w/Device KIT Accu-Chek Aviva Plus Meter  USE AS DIRECTED    [provider]  diclofenac sodium (VOLTAREN) 1 % GEL Apply 4 g topically 4 (four) times daily. To affected joint. 07/26/19   Gregor Hams, MD  doxycycline (VIBRAMYCIN) 100 MG capsule Take 1 capsule (100 mg total) by mouth 2 (two) times daily. Take with food. 09/02/19   Kandra Nicolas, MD  Dulaglutide (TRULICITY) 0.07 HQ/1.9XJ SOPN Inject 0.75 mg into the skin once a week. 03/31/19   Emeterio Reeve, DO  Erenumab-aooe (AIMOVIG) 70 MG/ML SOAJ Inject 70 mg into the skin every 30 (thirty) days. 07/14/19   Emeterio Reeve, DO  escitalopram (LEXAPRO) 10 MG tablet Take 1 tablet (10 mg total) by mouth at bedtime. Take w/ 5 mg tablet for total 15 mg daily 07/14/19   Emeterio Reeve, DO  escitalopram (LEXAPRO) 5 MG tablet Take 1 tablet (5 mg total) by mouth daily. Take with 10 mg tablets for total of 15 mg daily 07/14/19   Sheppard Coil,  Natalie, DO  ibuprofen (ADVIL) 800 MG tablet TAKE 1 TABLET BY MOUTH EVERY 8 HOURS AS NEEDED 04/22/19   Emeterio Reeve, DO  insulin detemir (LEVEMIR) 100 UNIT/ML injection Inject into the skin. 06/21/19   [provider]  Insulin Pen Needle (BD PEN NEEDLE NANO U/F) 32G X 4 MM MISC Use with levemir daily 06/21/19   [provider]  Insulin Syringe-Needle U-100 31G X 15/64" 0.3 ML MISC by Does not apply route. 06/27/19   [provider]  lidocaine (XYLOCAINE) 2 % solution Use as directed 5-10 mLs in the mouth or throat every 3 (three) hours as needed. 02/24/19   Emeterio Reeve, DO  lisinopril (ZESTRIL) 10 MG tablet Take 1 tablet by mouth daily. 07/27/19   [provider]  loratadine (CLARITIN) 10 MG tablet TAKE 1 TABLET BY MOUTH EVERY DAY 04/22/19   Emeterio Reeve, DO  metFORMIN  (GLUCOPHAGE XR) 500 MG 24 hr tablet Take 2 tablets (1,000 mg total) by mouth daily with breakfast. 11/12/18 11/12/19  Emeterio Reeve, DO  omeprazole (PRILOSEC) 10 MG capsule TAKE 1 CAPSULE BY MOUTH EVERY DAY 08/22/19   Emeterio Reeve, DO  predniSONE (DELTASONE) 20 MG tablet Take one tab by mouth twice daily for 4 days, then one daily. Take with food. 09/02/19   Kandra Nicolas, MD  pregabalin (LYRICA) 75 MG capsule Take 1 capsule (75 mg total) by mouth 2 (two) times daily. 08/02/19   Gregor Hams, MD  QUEtiapine (SEROQUEL) 100 MG tablet Take 1 tablet (100 mg total) by mouth at bedtime. 08/08/19   Gregor Hams, MD  SUMAtriptan (IMITREX) 50 MG tablet Take 0.5-1 tablets (25-50 mg total) by mouth every 2 (two) hours as needed for migraine. May repeat in 2 hours if headache persists or recurs. 07/14/19   Emeterio Reeve, DO  traZODone (DESYREL) 50 MG tablet TAKE 0.5-2 TABLETS (25-100 MG TOTAL) BY MOUTH AT BEDTIME AS NEEDED FOR SLEEP. 06/08/19   Emeterio Reeve, DO    Family History Family History  Problem Relation Age of Onset  . Depression Mother   . Diabetes Mother   . Hyperlipidemia Mother   . Hypertension Mother   . Hyperlipidemia Father   . Hypertension Father   . Cancer Maternal Uncle   . Diabetes Maternal Uncle   . Cancer Maternal Grandmother   . Hypertension Maternal Grandmother   . Hyperlipidemia Maternal Grandmother   . Hyperlipidemia Paternal Grandmother     Social History Social History   Tobacco Use  . Smoking status: Current Every Day Smoker    Packs/day: 0.50  . Smokeless tobacco: Never Used  Substance Use Topics  . Alcohol use: Not on file  . Drug use: Not on file     Allergies   Hydrocodone, Penicillins, Hydrocodone-acetaminophen, and Penicillin g   Review of Systems Review of Systems + sore throat + cough No pleuritic pain No wheezing + nasal congestion + post-nasal drainage No sinus pain/pressure No itchy/red eyes + left earache No  hemoptysis No SOB No fever/chills No nausea No vomiting No abdominal pain No diarrhea No urinary symptoms No skin rash + fatigue No myalgias No headache Used OTC meds without relief  Physical Exam Triage Vital Signs ED Triage Vitals  Enc Vitals Group     BP 09/02/19 1837 122/78     Pulse Rate 09/02/19 1837 100     Resp 09/02/19 1837 18     Temp 09/02/19 1837 98.6 F (37 C)     Temp Source 09/02/19  1837 Oral     SpO2 09/02/19 1837 96 %     Weight 09/02/19 1840 218 lb 4.1 oz (99 kg)     Height 09/02/19 1840 _0  (1.651 m)     Head Circumference --      Peak Flow --      Pain Score 09/02/19 1839 7     Pain Loc --      Pain Edu? --      Excl. in [place]? --    No data found.  Updated Vital Signs BP 122/78 (BP Location: Right Arm)   Pulse 100   Temp 98.6 F (37 C) (Oral)   Resp 18   Ht _1  (1.651 m)   Wt 99 kg   SpO2 96%   BMI 36.32 kg/m   Visual Acuity Right Eye Distance:   Left Eye Distance:   Bilateral Distance:    Right Eye Near:   Left Eye Near:    Bilateral Near:     Physical Exam Nursing notes and Vital Signs reviewed. Appearance:  Patient appears stated age, and in no acute distress Eyes:  Pupils are equal, round, and reactive to light and accomodation.  Extraocular movement is intact.  Conjunctivae are not inflamed  Ears:  Canals normal.  Tympanic membranes normal although left tympanic membrane has clear serous effusion. Nose:  Mildly congested turbinates.  No sinus tenderness.   Pharynx:  Normal Neck:  Supple.  Enlarged posterior/lateral nodes are palpated bilaterally, tender to palpation on the left.   Lungs:  Clear to auscultation.  Breath sounds are equal.  Moving air well. Heart:  Regular rate and rhythm without murmurs, rubs, or gallops.  Abdomen:  Nontender without masses or hepatosplenomegaly.  Bowel sounds are present.  No CVA or flank tenderness.  Extremities:  No edema.  Skin:  No rash present.    UC Treatments / Results  Labs (all  labs ordered are listed, but only abnormal results are displayed) Labs Reviewed -  Tympanometry:  Right ear tympanogram positive peak pressure; Left ear tympanogram low peak height ("flat line"), small ear volume  EKG   Radiology No results found.  Procedures Procedures (including critical care time)  Medications Ordered in UC Medications - No data to display  Initial Impression / Assessment and Plan / UC Course  I have reviewed the triage vital signs and the nursing notes.  Pertinent labs & imaging results that were available during my care of the patient were reviewed by me and considered in my medical decision making (see chart for details).    Begin prednisone burst/taper, and doxycycline. Followup with Family Doctor if not improved in about 10 days.   Final Clinical Impressions(s) / UC Diagnoses   Final diagnoses:  Viral URI with cough  Acute serous otitis media of left ear, recurrence not specified     Discharge Instructions     Take plain guaifenesin (1275m extended release tabs such as Mucinex) twice daily, with plenty of water, for cough and congestion.   Get adequate rest.   May use Afrin nasal spray (or generic oxymetazoline) each morning for about 5 days and then discontinue.  Also recommend using saline nasal spray several times daily and saline nasal irrigation (AYR is a common brand).    Try warm salt water gargles for sore throat.  Stop all antihistamines for now, and other non-prescription cough/cold preparations. May take Delsym Cough Suppressant at bedtime for nighttime cough.       ED Prescriptions  Medication Sig Dispense Auth. Provider   predniSONE (DELTASONE) 20 MG tablet Take one tab by mouth twice daily for 4 days, then one daily. Take with food. 12 tablet Kandra Nicolas, MD   doxycycline (VIBRAMYCIN) 100 MG capsule Take 1 capsule (100 mg total) by mouth 2 (two) times daily. Take with food. 20 capsule Kandra Nicolas, MD        Kandra Nicolas, MD 09/03/19 [phone]

## 2019-09-02 NOTE — Discharge Instructions (Signed)
Take plain guaifenesin (1200mg extended release tabs such as Mucinex) twice daily, with plenty of water, for cough and congestion.  Get adequate rest.   °May use Afrin nasal spray (or generic oxymetazoline) each morning for about 5 days and then discontinue.  Also recommend using saline nasal spray several times daily and saline nasal irrigation (AYR is a common brand).  °Try warm salt water gargles for sore throat.  °Stop all antihistamines for now, and other non-prescription cough/cold preparations. °May take Delsym Cough Suppressant at bedtime for nighttime cough.  °

## 2019-09-19 ENCOUNTER — Ambulatory Visit (INDEPENDENT_AMBULATORY_CARE_PROVIDER_SITE_OTHER): Payer: Medicaid Other | Admitting: Osteopathic Medicine

## 2019-09-19 ENCOUNTER — Encounter: Payer: Self-pay | Admitting: Osteopathic Medicine

## 2019-09-19 VITALS — Wt 210.0 lb

## 2019-09-19 DIAGNOSIS — F32A Depression, unspecified: Secondary | ICD-10-CM

## 2019-09-19 DIAGNOSIS — F419 Anxiety disorder, unspecified: Secondary | ICD-10-CM

## 2019-09-19 DIAGNOSIS — E119 Type 2 diabetes mellitus without complications: Secondary | ICD-10-CM

## 2019-09-19 DIAGNOSIS — H6690 Otitis media, unspecified, unspecified ear: Secondary | ICD-10-CM | POA: Diagnosis not present

## 2019-09-19 DIAGNOSIS — F39 Unspecified mood [affective] disorder: Secondary | ICD-10-CM

## 2019-09-19 DIAGNOSIS — F329 Major depressive disorder, single episode, unspecified: Secondary | ICD-10-CM

## 2019-09-19 MED ORDER — DOXYCYCLINE HYCLATE 100 MG PO CAPS: 100.0000 mg | ORAL_CAPSULE | Freq: Two times a day (BID) | ORAL | 0 refills | Status: DC

## 2019-09-19 MED ORDER — QUETIAPINE FUMARATE 100 MG PO TABS: 100.0000 mg | ORAL_TABLET | Freq: Every day | ORAL | 3 refills | Status: DC

## 2019-09-19 MED ORDER — VARENICLINE TARTRATE 1 MG PO TABS: 1.0000 mg | ORAL_TABLET | Freq: Two times a day (BID) | ORAL | 1 refills | Status: DC

## 2019-09-19 NOTE — Progress Notes (Signed)
Virtual Visit via Video (App used: Doximity) Note  I connected with      Rachael Fowler on 09/19/19 at 3:57 PM by a telemedicine application and verified that I am speaking with the correct person using two identifiers.  Patient is at home I am in office   I discussed the limitations of evaluation and management by telemedicine and the availability of in person appointments. The patient expressed understanding and agreed to proceed.  History of Present Illness: Rachael Fowler is a 33 y.o. female who would like to discuss anxiety follow-up    Stopped her Abilify, caused restlessness She is on Seroquel and Lyrica   Ear pain on the right side for about 4-5 days. (+)sinus prin and dental pain. Recurrence for recent similar episode.  She had seen urgent care 09/02/2019.     Observations/Objective: Wt 210 lb (95.3 kg)   BMI 34.95 kg/m  BP Readings from Last 3 Encounters:  09/02/19 122/78  08/02/19 122/77  07/26/19 (!) 159/104   Exam: Normal Speech.  NAD  Lab and Radiology Results No results found for this or any previous visit (from the past 72 hour(s)). No results found.     Assessment and Plan: 33 y.o. female with The primary encounter diagnosis was Type 2 diabetes mellitus without complication, without long-term current use of insulin ([name]). Diagnoses of Acute otitis media, unspecified otitis media type, Mood disorder ([name]), and Anxiety and depression were also pertinent to this visit.    PDMP not reviewed this encounter. Orders Placed This Encounter  Procedures  . A1C  . Ambulatory referral to Psychiatry    Referral Priority:   Routine    Referral Type:   Psychiatric    Referral Reason:   Specialty Services Required    Requested Specialty:   Psychiatry    Number of Visits Requested:   1   Meds ordered this encounter  Medications  . QUEtiapine (SEROQUEL) 100 MG tablet    Sig: Take 1 tablet (100 mg total) by mouth at bedtime.    Dispense:  90 tablet    Refill:   3  . varenicline (CHANTIX CONTINUING MONTH PAK) 1 MG tablet    Sig: Take 1 tablet (1 mg total) by mouth 2 (two) times daily.    Dispense:  180 tablet    Refill:  1  . doxycycline (VIBRAMYCIN) 100 MG capsule    Sig: Take 1 capsule (100 mg total) by mouth 2 (two) times daily. Take with food.    Dispense:  20 capsule    Refill:  0   Patient Instructions  Plan:  Due for A1c check for diabetes, it has been a few months.  Uncontrolled sugars might contribute to frequent infections, let us plan to get A1c done ahead of your endocrinology visit, they should be able to see the results in that way if things are severely high we might be able to get you a follow-up with them sooner than later.  Placed a referral for psychiatry.  We have tried several different medications without much success in controlling her symptoms, and if we are concerned about ADHD being a confounding factor I would like a formal evaluation completed to make sure that we are on the right track and to get you on the appropriate medications to get you feeling better!  You should be getting a call from psychiatrist office sometime within the next few business days, though things may be a bit delayed for Thanksgiving.  Please let  me know if you do not hear something by the end of next week.  I refilled the Seroquel, and the Chantix.  I refilled the antibiotics, doxycycline, for presumed sinus/ear infection.  If you are continuing to have issues, please see ENT Dr Jodi Mourning Rachael Fowler phone [phone]     Instructions sent via [place]. If MyChart not available, pt was given option for info via personal e-mail w/ no guarantee of protected health info over unsecured e-mail communication, and MyChart sign-up instructions were sent to patient.   Follow Up Instructions: Return if symptoms worsen or fail to improve.    I discussed the assessment and treatment plan with the patient. The patient was provided an opportunity to ask questions  and all were answered. The patient agreed with the plan and demonstrated an understanding of the instructions.   The patient was advised to call back or seek an in-person evaluation if any new concerns, if symptoms worsen or if the condition fails to improve as anticipated.  25 minutes of non-face-to-face time was provided during this encounter.      . . . . . . . . . . . . . [name]                   Historical information moved to improve visibility of documentation.  Past Medical History:  Diagnosis Date  . Asthma    Past Surgical History:  Procedure Laterality Date  . KNEE SURGERY  04/20/2006   Social History   Tobacco Use  . Smoking status: Current Every Day Smoker    Packs/day: 0.50  . Smokeless tobacco: Never Used  Substance Use Topics  . Alcohol use: Not on file   family history includes Cancer in her maternal grandmother and maternal uncle; Depression in her mother; Diabetes in her maternal uncle and mother; Hyperlipidemia in her father, maternal grandmother, mother, and paternal grandmother; Hypertension in her father, maternal grandmother, and mother.  Medications: Current Outpatient Medications  Medication Sig Dispense Refill  . Blood Glucose Monitoring Suppl (ACCU-CHEK AVIVA PLUS) w/Device KIT Accu-Chek Aviva Plus Meter  USE AS DIRECTED    . diclofenac sodium (VOLTAREN) 1 % GEL Apply 4 g topically 4 (four) times daily. To affected joint. 100 g 11  . doxycycline (VIBRAMYCIN) 100 MG capsule Take 1 capsule (100 mg total) by mouth 2 (two) times daily. Take with food. 20 capsule 0  . Dulaglutide (TRULICITY) 7.90 WI/0.9BD SOPN Inject 0.75 mg into the skin once a week. 4 pen 11  . Erenumab-aooe (AIMOVIG) 70 MG/ML SOAJ Inject 70 mg into the skin every 30 (thirty) days. 3 pen 1  . escitalopram (LEXAPRO) 10 MG tablet Take 1 tablet (10 mg total) by mouth at bedtime. Take w/ 5 mg tablet for total 15 mg daily 90 tablet 3  . escitalopram (LEXAPRO) 5 MG  tablet Take 1 tablet (5 mg total) by mouth daily. Take with 10 mg tablets for total of 15 mg daily 90 tablet 3  . ibuprofen (ADVIL) 800 MG tablet TAKE 1 TABLET BY MOUTH EVERY 8 HOURS AS NEEDED 60 tablet 0  . insulin detemir (LEVEMIR) 100 UNIT/ML injection Inject into the skin.    . Insulin Pen Needle (BD PEN NEEDLE NANO U/F) 32G X 4 MM MISC Use with levemir daily    . Insulin Syringe-Needle U-100 31G X 15/64" 0.3 ML MISC by Does not apply route.    . lidocaine (XYLOCAINE) 2 % solution Use as directed 5-10 mLs in the mouth or  throat every 3 (three) hours as needed. 100 mL 1  . lisinopril (ZESTRIL) 10 MG tablet Take 1 tablet by mouth daily.    [name] loratadine (CLARITIN) 10 MG tablet TAKE 1 TABLET BY MOUTH EVERY DAY 30 tablet 5  . metFORMIN (GLUCOPHAGE XR) 500 MG 24 hr tablet Take 2 tablets (1,000 mg total) by mouth daily with breakfast. 180 tablet 1  . omeprazole (PRILOSEC) 10 MG capsule TAKE 1 CAPSULE BY MOUTH EVERY DAY 90 capsule 3  . predniSONE (DELTASONE) 20 MG tablet Take one tab by mouth twice daily for 4 days, then one daily. Take with food. 12 tablet 0  . pregabalin (LYRICA) 75 MG capsule Take 1 capsule (75 mg total) by mouth 2 (two) times daily. 60 capsule 1  . QUEtiapine (SEROQUEL) 100 MG tablet Take 1 tablet (100 mg total) by mouth at bedtime. 30 tablet 1  . SUMAtriptan (IMITREX) 50 MG tablet Take 0.5-1 tablets (25-50 mg total) by mouth every 2 (two) hours as needed for migraine. May repeat in 2 hours if headache persists or recurs. 10 tablet 0  . traZODone (DESYREL) 50 MG tablet TAKE 0.5-2 TABLETS (25-100 MG TOTAL) BY MOUTH AT BEDTIME AS NEEDED FOR SLEEP. 45 tablet 0  . ARIPiprazole (ABILIFY) 10 MG tablet Take 1 tablet (10 mg total) by mouth daily. Start w/ half tablet (5 mg total) for first 4-6 days (Patient not taking: Reported on 09/19/2019) 90 tablet 0   No current facility-administered medications for this visit.    Allergies  Allergen Reactions  . Hydrocodone Itching and Nausea And  Vomiting    Also nausea and vomiting   . Penicillins Itching, Nausea And Vomiting and Other (See Comments)  . Abilify [Aripiprazole]     Very restless, extreme bilateral arm pain  . Hydrocodone-Acetaminophen Itching and Nausea And Vomiting  . Penicillin G Itching and Nausea And Vomiting

## 2019-09-19 NOTE — Progress Notes (Signed)
Called pt at 328 pm, no answer. Left a vm msg.

## 2019-09-20 ENCOUNTER — Other Ambulatory Visit: Payer: Self-pay | Admitting: Osteopathic Medicine

## 2019-09-20 ENCOUNTER — Encounter: Payer: Self-pay | Admitting: Osteopathic Medicine

## 2019-09-20 MED ORDER — VARENICLINE TARTRATE 1 MG PO TABS: 1.0000 mg | ORAL_TABLET | Freq: Two times a day (BID) | ORAL | 1 refills | Status: DC

## 2019-09-20 NOTE — Telephone Encounter (Signed)
Sent referral to [facility] they will call and schedule with patient for good appointment time - CF

## 2019-09-20 NOTE — Patient Instructions (Addendum)
Plan:  Due for A1c check for diabetes, it has been a few months.  Uncontrolled sugars might contribute to frequent infections, let us plan to get A1c done ahead of your endocrinology visit, they should be able to see the results in that way if things are severely high we might be able to get you a follow-up with them sooner than later.  Placed a referral for psychiatry.  We have tried several different medications without much success in controlling her symptoms, and if we are concerned about ADHD being a confounding factor I would like a formal evaluation completed to make sure that we are on the right track and to get you on the appropriate medications to get you feeling better!  You should be getting a call from psychiatrist office sometime within the next few business days, though things may be a bit delayed for Thanksgiving.  Please let me know if you do not hear something by the end of next week.  I refilled the Seroquel, and the Chantix.  I refilled the antibiotics, doxycycline, for presumed sinus/ear infection.  If you are continuing to have issues, please see ENT Dr Jodi Mourning [name] phone [phone]

## 2019-09-21 ENCOUNTER — Other Ambulatory Visit: Payer: Self-pay

## 2019-09-21 ENCOUNTER — Ambulatory Visit: Payer: Medicaid Other | Admitting: Obstetrics and Gynecology

## 2019-09-21 ENCOUNTER — Other Ambulatory Visit (HOSPITAL_COMMUNITY)
Admission: RE | Admit: 2019-09-21 | Discharge: 2019-09-21 | Disposition: A | Discharge status: Home / Self Care | Payer: Medicaid Other | Source: Ambulatory Visit | Attending: Obstetrics and Gynecology | Admitting: Obstetrics and Gynecology

## 2019-09-21 ENCOUNTER — Encounter: Payer: Self-pay | Admitting: Obstetrics and Gynecology

## 2019-09-21 VITALS — BP 136/76 | HR 108 | Temp 98.3°F | Resp 16 | Ht 64.0 in | Wt 221.0 lb

## 2019-09-21 DIAGNOSIS — Z01419 Encounter for gynecological examination (general) (routine) without abnormal findings: Secondary | ICD-10-CM

## 2019-09-21 DIAGNOSIS — Z0000 Encounter for general adult medical examination without abnormal findings: Secondary | ICD-10-CM | POA: Diagnosis not present

## 2019-09-21 DIAGNOSIS — N939 Abnormal uterine and vaginal bleeding, unspecified: Secondary | ICD-10-CM

## 2019-09-21 NOTE — Progress Notes (Signed)
GYNECOLOGY ANNUAL PREVENTATIVE CARE ENCOUNTER NOTE  History:     Rachael Fowler is a 33 y.o. G26P1001 female here for a routine annual gynecologic exam.  Current complaints: abnormal vaginal bleeding since IUD was placed in 2019. Random dark blood spotting. Some right ovarian pain which is consistent with pain she has had associated with PCOS.    Denies discharge, problems with intercourse or other gynecologic concerns.    Gynecologic History No LMP recorded. (Menstrual status: IUD). Contraception: IUD Last Pap: 2018. Patient unsure of results. She thinks they were normal.  Last mammogram: NA.   Obstetric History OB History  Gravida Para Term Preterm AB Living  _0 SAB TAB Ectopic Multiple Live Births               # Outcome Date GA Lbr Len/2nd Weight Sex Delivery Anes PTL Lv  1 Term 04/07/18 [redacted]w[redacted]d   CS-LTranv       Past Medical History:  Diagnosis Date  . Asthma   . Diabetes mellitus without complication ([place]   . Hypertension   . Mental disorder     Past Surgical History:  Procedure Laterality Date  . CESAREAN SECTION    . KNEE SURGERY  04/20/2006    Current Outpatient Medications on File Prior to Visit  Medication Sig Dispense Refill  . Blood Glucose Monitoring Suppl (ACCU-CHEK AVIVA PLUS) w/Device KIT Accu-Chek Aviva Plus Meter  USE AS DIRECTED    . diclofenac sodium (VOLTAREN) 1 % GEL Apply 4 g topically 4 (four) times daily. To affected joint. 100 g 11  . doxycycline (VIBRAMYCIN) 100 MG capsule Take 1 capsule (100 mg total) by mouth 2 (two) times daily. Take with food. 20 capsule 0  . Dulaglutide (TRULICITY) 08.50MYD/7.4JOSOPN Inject 0.75 mg into the skin once a week. 4 pen 11  . Erenumab-aooe (AIMOVIG) 70 MG/ML SOAJ Inject 70 mg into the skin every 30 (thirty) days. 3 pen 1  . escitalopram (LEXAPRO) 10 MG tablet Take 1 tablet (10 mg total) by mouth at bedtime. Take w/ 5 mg tablet for total 15 mg daily 90 tablet 3  . escitalopram (LEXAPRO) 5 MG tablet  Take 1 tablet (5 mg total) by mouth daily. Take with 10 mg tablets for total of 15 mg daily 90 tablet 3  . ibuprofen (ADVIL) 800 MG tablet TAKE 1 TABLET BY MOUTH EVERY 8 HOURS AS NEEDED 60 tablet 0  . insulin detemir (LEVEMIR) 100 UNIT/ML injection Inject into the skin.    . Insulin Pen Needle (BD PEN NEEDLE NANO U/F) 32G X 4 MM MISC Use with levemir daily    . Insulin Syringe-Needle U-100 31G X 15/64" 0.3 ML MISC by Does not apply route.    .Marland Kitchenlevonorgestrel (MIRENA) 20 MCG/24HR IUD 1 each by Intrauterine route once.    . lidocaine (XYLOCAINE) 2 % solution Use as directed 5-10 mLs in the mouth or throat every 3 (three) hours as needed. 100 mL 1  . lisinopril (ZESTRIL) 10 MG tablet Take 1 tablet by mouth daily.    .Marland Kitchenloratadine (CLARITIN) 10 MG tablet TAKE 1 TABLET BY MOUTH EVERY DAY 30 tablet 5  . metFORMIN (GLUCOPHAGE XR) 500 MG 24 hr tablet Take 2 tablets (1,000 mg total) by mouth daily with breakfast. 180 tablet 1  . omeprazole (PRILOSEC) 10 MG capsule TAKE 1 CAPSULE BY MOUTH EVERY DAY 90 capsule 3  . pregabalin (LYRICA) 75 MG capsule Take 1  capsule (75 mg total) by mouth 2 (two) times daily. 60 capsule 1  . QUEtiapine (SEROQUEL) 100 MG tablet Take 1 tablet (100 mg total) by mouth at bedtime. 90 tablet 3  . SUMAtriptan (IMITREX) 50 MG tablet Take 0.5-1 tablets (25-50 mg total) by mouth every 2 (two) hours as needed for migraine. May repeat in 2 hours if headache persists or recurs. 10 tablet 0  . varenicline (CHANTIX CONTINUING MONTH PAK) 1 MG tablet Take 1 tablet (1 mg total) by mouth 2 (two) times daily. 180 tablet 1   No current facility-administered medications on file prior to visit.     Allergies  Allergen Reactions  . Hydrocodone Itching and Nausea And Vomiting    Also nausea and vomiting   . Penicillins Itching, Nausea And Vomiting and Other (See Comments)  . Abilify [Aripiprazole]     Very restless, extreme bilateral arm pain  . Hydrocodone-Acetaminophen Itching and Nausea And  Vomiting  . Penicillin G Itching and Nausea And Vomiting    Social History:  reports that she has been smoking. She has been smoking about 0.50 packs per day. She has never used smokeless tobacco. She reports previous alcohol use. She reports that she does not use drugs.  Family History  Problem Relation Age of Onset  . Depression Mother   . Diabetes Mother   . Hyperlipidemia Mother   . Hypertension Mother   . Hyperlipidemia Father   . Hypertension Father   . Cancer Maternal Uncle   . Diabetes Maternal Uncle   . Cancer Maternal Grandmother   . Hypertension Maternal Grandmother   . Hyperlipidemia Maternal Grandmother   . Hyperlipidemia Paternal Grandmother     The following portions of the patient's history were reviewed and updated as appropriate: allergies, current medications, past family history, past medical history, past social history, past surgical history and problem list.  Review of Systems Pertinent items noted in HPI and remainder of comprehensive ROS otherwise negative.  Physical Exam:  BP 136/76   Pulse (!) 108   Temp 98.3 F (36.8 C) (Temporal)   Resp 16   Ht _0  (1.626 m)   Wt 221 lb (100.2 kg)   BMI 37.93 kg/m  CONSTITUTIONAL: Well-developed, well-nourished female in no acute distress.  HENT:  Normocephalic, atraumatic, External right and left ear normal. Oropharynx is clear and moist EYES: Conjunctivae and EOM are normal. Pupils are equal, round, and reactive to light. No scleral icterus.  NECK: Normal range of motion, supple, no masses.  Normal thyroid.  SKIN: Skin is warm and dry. No rash noted. Not diaphoretic. No erythema. No pallor. MUSCULOSKELETAL: Normal range of motion. No tenderness.  No cyanosis, clubbing, or edema.  2+ distal pulses. NEUROLOGIC: Alert and oriented to person, place, and time. Normal reflexes, muscle tone coordination.  PSYCHIATRIC: Normal mood and affect. Normal behavior. Normal judgment and thought content. CARDIOVASCULAR:  Normal heart rate noted, regular rhythm RESPIRATORY: Clear to auscultation bilaterally. Effort and breath sounds normal, no problems with respiration noted. BREASTS: Symmetric in size. No masses, tenderness, skin changes, nipple drainage, or lymphadenopathy bilaterally. ABDOMEN: Soft, no distention noted.  No tenderness, rebound or guarding.  PELVIC: Normal appearing external genitalia and urethral meatus; normal appearing vaginal mucosa and cervix.  No abnormal discharge noted.  Pap smear obtained.  Normal uterine size, no other palpable masses, no uterine or adnexal tenderness. IUD visualized.    Assessment and Plan:   1. Abnormal uterine bleeding  - US PELVIC COMPLETE WITH TRANSVAGINAL; Future  to check IUD placement  - Discussed BC pills for abnormal bleeding. Patient declined.   2. Women's annual routine gynecological examination  - Cytology - PAP( [place]) - She should get pneumonia vaccine with PCP   Will follow up results of pap smear and manage accordingly. Routine preventative health maintenance measures emphasized. Please refer to After Visit Summary for other counseling recommendations.     Veverly Larimer, Artist Pais, [place] [facility] [employer], [place]

## 2019-09-22 LAB — HEMOGLOBIN A1C
Hgb A1c MFr Bld: 6.7 % of total Hgb — ABNORMAL HIGH (ref <5.7)
Mean Plasma Glucose: 146 (calc)
eAG (mmol/L): 8.1 (calc)

## 2019-09-23 LAB — CYTOLOGY - PAP
Comment: NEGATIVE
Diagnosis: NEGATIVE
High risk HPV: NEGATIVE

## 2019-09-26 ENCOUNTER — Encounter: Payer: Self-pay | Admitting: Osteopathic Medicine

## 2019-09-28 ENCOUNTER — Encounter: Payer: Self-pay | Admitting: *Deleted

## 2019-09-28 ENCOUNTER — Other Ambulatory Visit: Payer: Self-pay

## 2019-09-28 ENCOUNTER — Emergency Department
Admission: EM | Admit: 2019-09-28 | Discharge: 2019-09-28 | Disposition: A | Discharge status: Home / Self Care | Payer: Medicaid Other | Source: Home / Self Care

## 2019-09-28 DIAGNOSIS — W57XXXA Bitten or stung by nonvenomous insect and other nonvenomous arthropods, initial encounter: Secondary | ICD-10-CM

## 2019-09-28 NOTE — Telephone Encounter (Signed)
I sent patient a message letting her know to call the number on her card and they will tell her where she can go to be seen. I also asked her to let us know where she would like the referral sent once she speaks to the insurance company. - CF

## 2019-09-28 NOTE — ED Triage Notes (Signed)
Pt c/o tick on her RT shoulder blade area x 3 days. Now her RT axilla and shoulder are tender to touch.

## 2019-09-29 DIAGNOSIS — W57XXXA Bitten or stung by nonvenomous insect and other nonvenomous arthropods, initial encounter: Secondary | ICD-10-CM

## 2019-09-29 DIAGNOSIS — S20469A Insect bite (nonvenomous) of unspecified back wall of thorax, initial encounter: Secondary | ICD-10-CM

## 2019-09-29 NOTE — ED Provider Notes (Signed)
[facility]    CSN: [id number] Arrival date & time: 09/28/19  1833      History   Chief Complaint Chief Complaint  Patient presents with  . Tick Removal    HPI Rachael Fowler is a 33 y.o. female.   Pt reports she found a tick on the right side of her back.  Patient states she and her husband tried to remove it but the head of the tick broke off her skin patient is here requesting removal of tick head.  Patient is currently on doxycycline for a gynecologic issue.  She has not had any fever she has not had any chills patient has a small dark area on the right side of her back.  The history is provided by the patient. No language interpreter was used.    Past Medical History:  Diagnosis Date  . Asthma   . Diabetes mellitus without complication ([place])   . Hypertension   . Mental disorder     Patient Active Problem List   Diagnosis Date Noted  . Mood disorder ([place]) 07/14/2019  . Patellofemoral syndrome, bilateral 08/17/2018  . Chronic bilateral low back pain without sciatica 08/17/2018  . Pain of left heel 07/14/2018  . Pain of right heel 07/14/2018  . Leukocytosis 07/14/2018  . Hirsutism 05/26/2018  . Abnormal WBC count 05/26/2018  . Psychophysiological insomnia 05/26/2018  . Anxiety and depression 05/26/2018  . Chronic tension-type headache, not intractable 05/26/2018  . Moderate persistent asthma with exacerbation 02/05/2018  . Type 2 diabetes mellitus without complication, without long-term current use of insulin ([place]) 08/19/2017  . Pregnancy 08/19/2017  . OTHER ACUTE REACTIONS TO STRESS 12/04/2009    Past Surgical History:  Procedure Laterality Date  . CESAREAN SECTION    . KNEE SURGERY  04/20/2006    OB History    Gravida  1   Para  1   Term  1   Preterm      AB      Living  1     SAB      TAB      Ectopic      Multiple      Live Births               Home Medications    Prior to Admission medications   Medication Sig  Start Date End Date Taking? Authorizing Provider  Blood Glucose Monitoring Suppl (ACCU-CHEK AVIVA PLUS) w/Device KIT Accu-Chek Aviva Plus Meter  USE AS DIRECTED    [provider]  diclofenac sodium (VOLTAREN) 1 % GEL Apply 4 g topically 4 (four) times daily. To affected joint. 07/26/19   Gregor Hams, MD  doxycycline (VIBRAMYCIN) 100 MG capsule Take 1 capsule (100 mg total) by mouth 2 (two) times daily. Take with food. 09/19/19   Emeterio Reeve, DO  Dulaglutide (TRULICITY) 2.86 NO/1.7RN SOPN Inject 0.75 mg into the skin once a week. 03/31/19   Emeterio Reeve, DO  Erenumab-aooe (AIMOVIG) 70 MG/ML SOAJ Inject 70 mg into the skin every 30 (thirty) days. 07/14/19   Emeterio Reeve, DO  escitalopram (LEXAPRO) 10 MG tablet Take 1 tablet (10 mg total) by mouth at bedtime. Take w/ 5 mg tablet for total 15 mg daily 07/14/19   Emeterio Reeve, DO  escitalopram (LEXAPRO) 5 MG tablet Take 1 tablet (5 mg total) by mouth daily. Take with 10 mg tablets for total of 15 mg daily 07/14/19   Emeterio Reeve, DO  ibuprofen (ADVIL) 800 MG tablet  TAKE 1 TABLET BY MOUTH EVERY 8 HOURS AS NEEDED 04/22/19   Emeterio Reeve, DO  insulin detemir (LEVEMIR) 100 UNIT/ML injection Inject into the skin. 06/21/19   [provider]  Insulin Pen Needle (BD PEN NEEDLE NANO U/F) 32G X 4 MM MISC Use with levemir daily 06/21/19   [provider]  Insulin Syringe-Needle U-100 31G X 15/64" 0.3 ML MISC by Does not apply route. 06/27/19   [provider]  levonorgestrel (MIRENA) 20 MCG/24HR IUD 1 each by Intrauterine route once.    [provider]  lidocaine (XYLOCAINE) 2 % solution Use as directed 5-10 mLs in the mouth or throat every 3 (three) hours as needed. 02/24/19   Emeterio Reeve, DO  lisinopril (ZESTRIL) 10 MG tablet Take 1 tablet by mouth daily. 07/27/19   [provider]  loratadine (CLARITIN) 10 MG tablet TAKE 1 TABLET BY MOUTH EVERY DAY 04/22/19   Emeterio Reeve, DO  metFORMIN (GLUCOPHAGE XR) 500 MG 24 hr tablet Take 2 tablets (1,000 mg total) by mouth daily with breakfast. 11/12/18 11/12/19  Emeterio Reeve, DO  omeprazole (PRILOSEC) 10 MG capsule TAKE 1 CAPSULE BY MOUTH EVERY DAY 08/22/19   Emeterio Reeve, DO  pregabalin (LYRICA) 75 MG capsule Take 1 capsule (75 mg total) by mouth 2 (two) times daily. 08/02/19   Gregor Hams, MD  QUEtiapine (SEROQUEL) 100 MG tablet Take 1 tablet (100 mg total) by mouth at bedtime. 09/19/19   Emeterio Reeve, DO  SUMAtriptan (IMITREX) 50 MG tablet Take 0.5-1 tablets (25-50 mg total) by mouth every 2 (two) hours as needed for migraine. May repeat in 2 hours if headache persists or recurs. 07/14/19   Emeterio Reeve, DO  varenicline (CHANTIX CONTINUING MONTH PAK) 1 MG tablet Take 1 tablet (1 mg total) by mouth 2 (two) times daily. 09/20/19   Emeterio Reeve, DO    Family History Family History  Problem Relation Age of Onset  . Depression Mother   . Diabetes Mother   . Hyperlipidemia Mother   . Hypertension Mother   . Hyperlipidemia Father   . Hypertension Father   . Cancer Maternal Uncle   . Diabetes Maternal Uncle   . Cancer Maternal Grandmother   . Hypertension Maternal Grandmother   . Hyperlipidemia Maternal Grandmother   . Hyperlipidemia Paternal Grandmother     Social History Social History   Tobacco Use  . Smoking status: Current Every Day Smoker    Packs/day: 0.50  . Smokeless tobacco: Never Used  Substance Use Topics  . Alcohol use: Not Currently    Comment: rarely  . Drug use: Never     Allergies   Hydrocodone, Penicillins, Abilify [aripiprazole], Hydrocodone-acetaminophen, and Penicillin g   Review of Systems Review of Systems  All other systems reviewed and are negative.    Physical Exam Triage Vital Signs ED Triage Vitals  Enc Vitals Group     BP 09/28/19 1903 117/84     Pulse Rate 09/28/19 1903 90     Resp 09/28/19 1903 18     Temp 09/28/19 1903 98.7  F (37.1 C)     Temp Source 09/28/19 1903 Oral     SpO2 09/28/19 1903 97 %     Weight 09/28/19 1904 224 lb (101.6 kg)     Height 09/28/19 1904 _0  (1.626 m)     Head Circumference --      Peak Flow --      Pain Score 09/28/19 1904 2  Pain Loc --      Pain Edu? --      Excl. in [place]? --    No data found.  Updated Vital Signs BP 117/84 (BP Location: Right Arm)   Pulse 90   Temp 98.7 F (37.1 C) (Oral)   Resp 18   Ht _0  (1.626 m)   Wt 101.6 kg   SpO2 97%   BMI 38.45 kg/m   Visual Acuity Right Eye Distance:   Left Eye Distance:   Bilateral Distance:    Right Eye Near:   Left Eye Near:    Bilateral Near:     Physical Exam Vitals signs reviewed.  Constitutional:      Appearance: Normal appearance.  HENT:     Head: Normocephalic.  Cardiovascular:     Rate and Rhythm: Normal rate.  Pulmonary:     Effort: Pulmonary effort is normal.  Musculoskeletal: Normal range of motion.  Skin:    General: Skin is warm.  Neurological:     General: No focal deficit present.     Mental Status: She is alert.  Psychiatric:        Mood and Affect: Mood normal.    Small black dot right upper back posterior shoulder area with 5 mm area of redness  UC Treatments / Results  Labs (all labs ordered are listed, but only abnormal results are displayed) Labs Reviewed - No data to display  EKG   Radiology No results found.  Procedures Foreign Body Removal  Date/Time: 09/29/2019 8:35 AM Performed by: Fransico Meadow, PA-C Authorized by: Fransico Meadow, PA-C   Consent:    Consent obtained:  Verbal   Consent given by:  Patient   Risks discussed:  Bleeding   Alternatives discussed:  No treatment Location:    Location:  Trunk   Trunk location:  Upper back   Depth:  Subcutaneous   Tendon involvement:  None Pre-procedure details:    Imaging:  None Anesthesia (see MAR for exact dosages):    Anesthesia method:  Local infiltration Procedure type:    Procedure  complexity:  Simple Procedure details:    Scalpel size:  11   Foreign bodies recovered:  1   Intact foreign body removal: yes   Post-procedure details:    Dressing:  Antibiotic ointment   Patient tolerance of procedure:  Tolerated well, no immediate complications Comments:     I was unable to remove tick with forceps.  I excised a small area of skin with the embedded tick head.   (including critical care time)  Medications Ordered in UC Medications - No data to display  Initial Impression / Assessment and Plan / UC Course  I have reviewed the triage vital signs and the nursing notes.  Pertinent labs & imaging results that were available during my care of the patient were reviewed by me and considered in my medical decision making (see chart for details).     Patient counseled on risk of tickborne illness as well as wound infection she is advised to return if any problems Final Clinical Impressions(s) / UC Diagnoses   Final diagnoses:  Tick bite with subsequent removal of tick   Discharge Instructions   None    ED Prescriptions    None     PDMP not reviewed this encounter.  An After Visit Summary was printed and given to the patient.   Fransico Meadow, [place] 09/29/19 [phone]

## 2019-10-02 ENCOUNTER — Other Ambulatory Visit: Payer: Self-pay | Admitting: Family Medicine

## 2019-10-05 IMAGING — DX DG KNEE COMPLETE 4+V*R*
5 series · 5 of 5 positions shown · non-contrast
Comparison: None

CLINICAL DATA: BILATERAL knee pain for several months, history of
LEFT knee surgery in 2229

EXAM:
LEFT KNEE - COMPLETE 4+ VIEW; RIGHT KNEE - COMPLETE 4+ VIEW

[knee lat (1 of 2)]
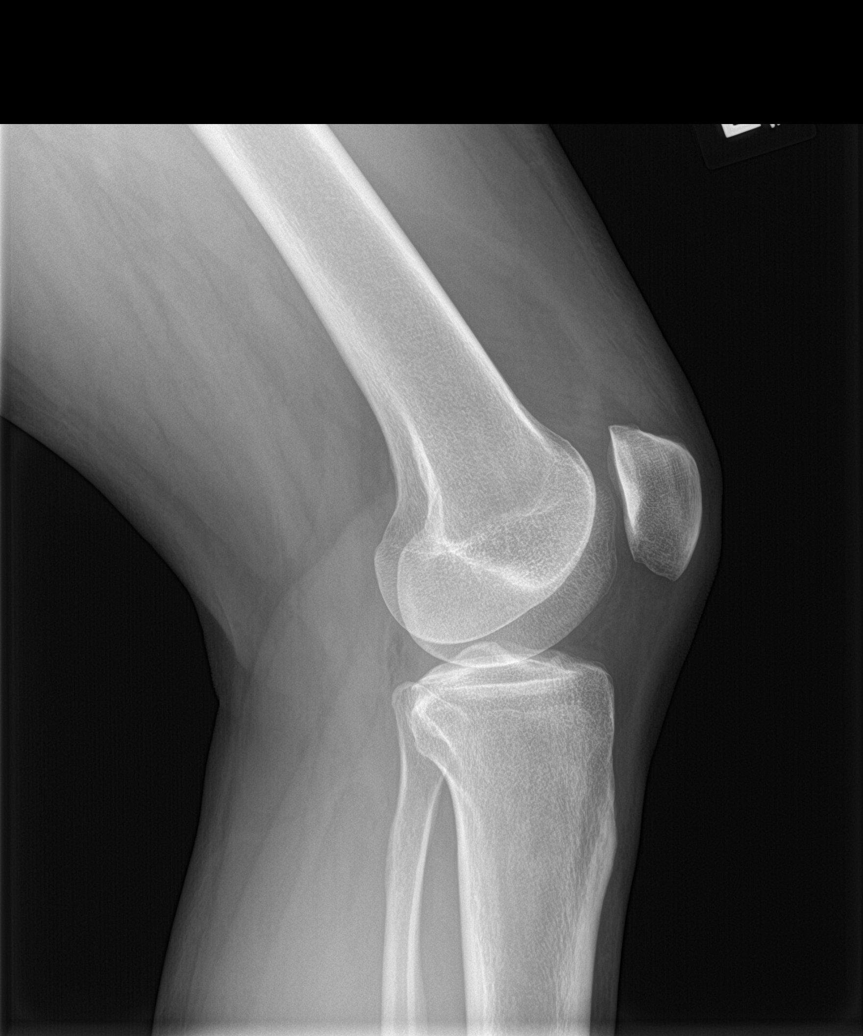

[knee sunrise]
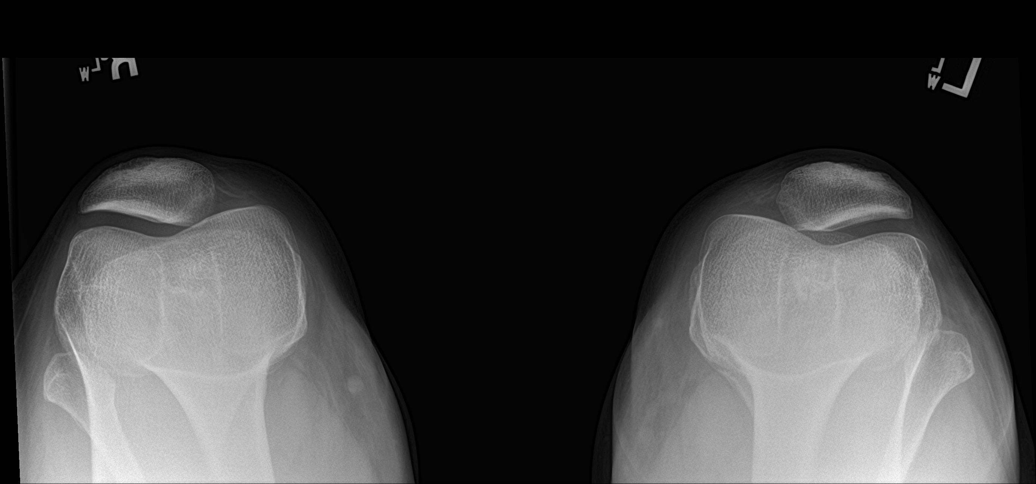

[knee ap bilat standing (1 of 2)]
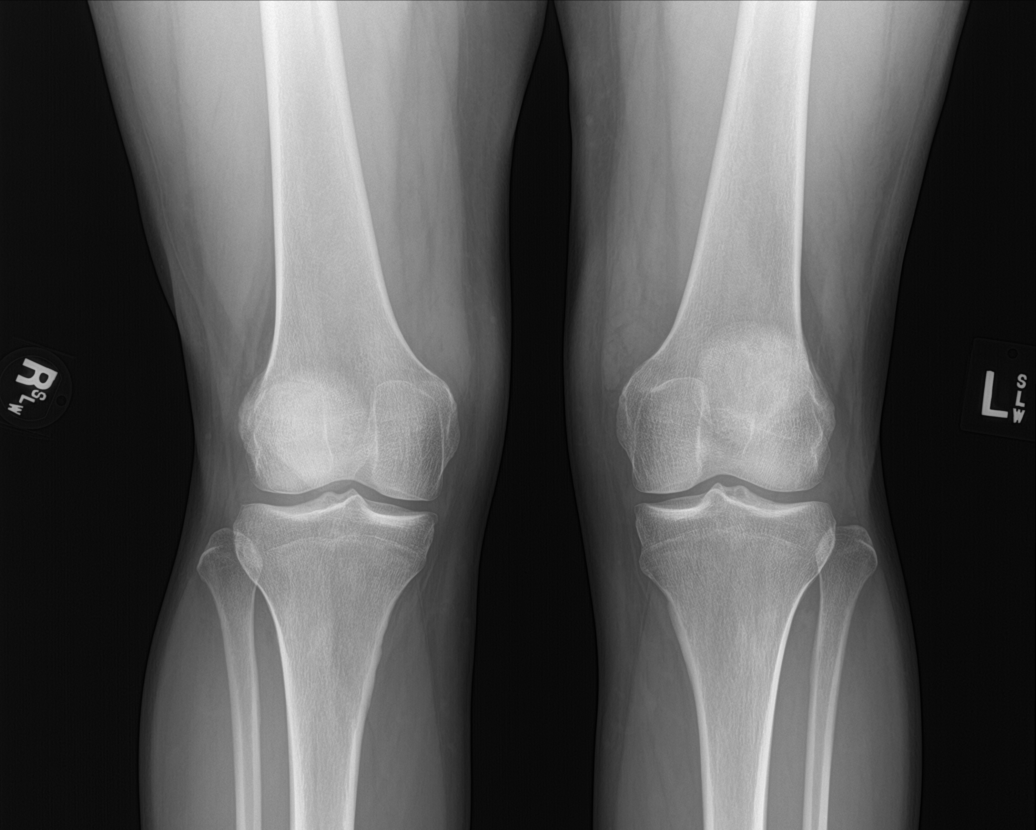

[knee ap bilat standing (2 of 2)]
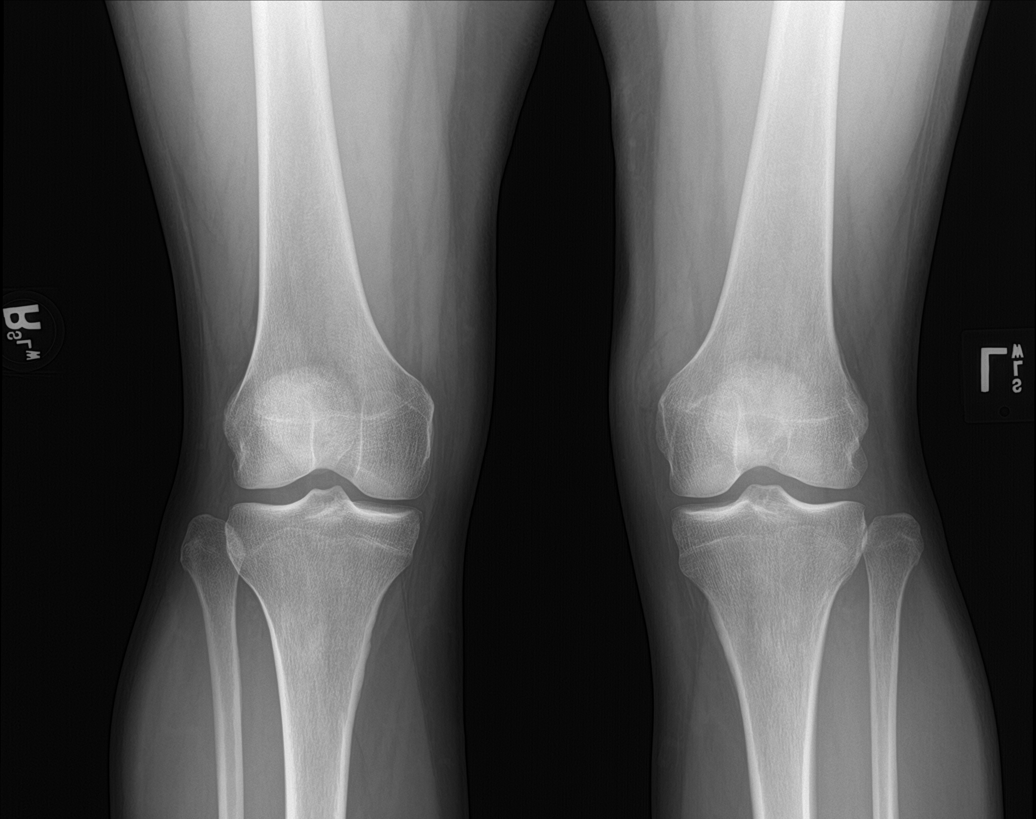

[knee lat (2 of 2)]
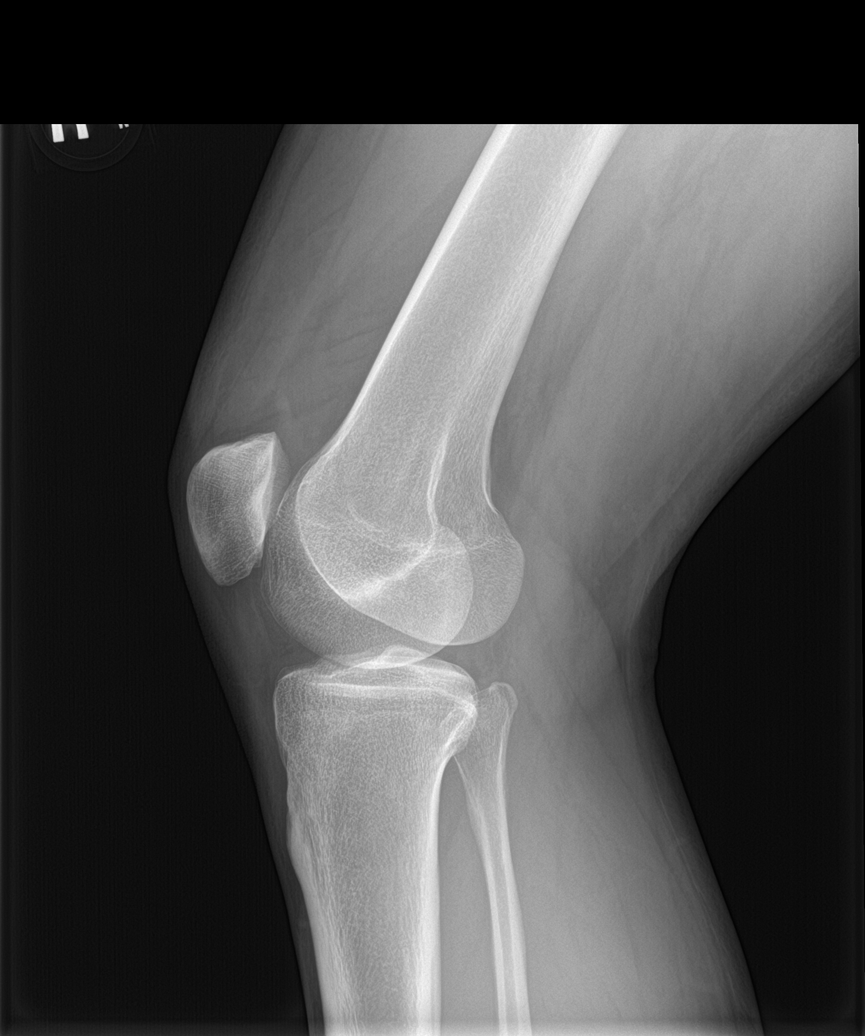

[5 of 5 positions shown; findings below may reference images not displayed]

FINDINGS: RIGHT knee:

Osseous mineralization normal.

Joint spaces preserved.

No acute fracture, dislocation, or bone destruction.

No knee joint effusion.

LEFT knee:

Osseous mineralization normal.

Joint spaces preserved.

No acute fracture, dislocation, bone destruction or joint effusion.
IMPRESSION: Unremarkable BILATERAL knee radiographs.

## 2019-10-06 ENCOUNTER — Ambulatory Visit (INDEPENDENT_AMBULATORY_CARE_PROVIDER_SITE_OTHER): Payer: Medicaid Other

## 2019-10-06 ENCOUNTER — Other Ambulatory Visit (HOSPITAL_BASED_OUTPATIENT_CLINIC_OR_DEPARTMENT_OTHER): Payer: Medicaid Other

## 2019-10-06 ENCOUNTER — Other Ambulatory Visit: Payer: Self-pay

## 2019-10-06 DIAGNOSIS — N939 Abnormal uterine and vaginal bleeding, unspecified: Secondary | ICD-10-CM

## 2019-10-18 ENCOUNTER — Other Ambulatory Visit: Payer: Self-pay

## 2019-10-18 DIAGNOSIS — Z789 Other specified health status: Secondary | ICD-10-CM

## 2019-10-18 MED ORDER — NORGESTIMATE-ETH ESTRADIOL 0.25-35 MG-MCG PO TABS: 1.0000 | ORAL_TABLET | Freq: Every day | ORAL | 11 refills | Status: DC

## 2019-10-18 NOTE — Progress Notes (Signed)
Rx for OCP sent per Noni Saupe, NP

## 2019-11-05 ENCOUNTER — Other Ambulatory Visit: Payer: Self-pay | Admitting: Osteopathic Medicine

## 2019-12-24 ENCOUNTER — Other Ambulatory Visit: Payer: Self-pay | Admitting: Osteopathic Medicine

## 2020-01-01 ENCOUNTER — Other Ambulatory Visit: Payer: Self-pay | Admitting: Osteopathic Medicine

## 2020-01-02 NOTE — Telephone Encounter (Signed)
[facility] pharmacy requesting med refill for meloxicam. Rx not listed in active med list. Pt contacted the pharmacy directly for med refill. Forwarding to covering provider.

## 2020-01-02 NOTE — Telephone Encounter (Signed)
Pt has been updated and is aware she will need to scheduled an appt to get refill for meloxicam. No other inquiries during call.

## 2020-01-17 ENCOUNTER — Other Ambulatory Visit: Payer: Self-pay | Admitting: Osteopathic Medicine

## 2020-01-25 ENCOUNTER — Encounter: Payer: Self-pay | Admitting: Osteopathic Medicine

## 2020-01-25 ENCOUNTER — Telehealth (INDEPENDENT_AMBULATORY_CARE_PROVIDER_SITE_OTHER): Payer: Medicaid Other | Admitting: Osteopathic Medicine

## 2020-01-25 VITALS — Wt 220.0 lb

## 2020-01-25 DIAGNOSIS — F39 Unspecified mood [affective] disorder: Secondary | ICD-10-CM

## 2020-01-25 DIAGNOSIS — M545 Low back pain, unspecified: Secondary | ICD-10-CM

## 2020-01-25 DIAGNOSIS — G8929 Other chronic pain: Secondary | ICD-10-CM | POA: Diagnosis not present

## 2020-01-25 MED ORDER — MELOXICAM 15 MG PO TABS: 7.5000 mg | ORAL_TABLET | Freq: Every day | ORAL | 1 refills | Status: DC

## 2020-01-25 MED ORDER — LORATADINE 10 MG PO TABS: 10.0000 mg | ORAL_TABLET | Freq: Every day | ORAL | 3 refills | Status: DC

## 2020-01-25 NOTE — Progress Notes (Signed)
Virtual Visit via Video (App used: MyChart) Note  I connected with      Rachael Fowler on 01/25/20 at 1:10 PM  by a telemedicine application and verified that I am speaking with the correct person using two identifiers.  Patient is in the car I am in office   I discussed the limitations of evaluation and management by telemedicine and the availability of in person appointments. The patient expressed understanding and agreed to proceed.  History of Present Illness: Rachael Fowler is a 34 y.o. female who would like to discuss  Chief Complaint  Patient presents with  . Facial Pain    congestion  . Medication Refill    meloxicam  . Referral   Sinuses are a bit better since being on antibiotics for tooth infection.   Started meloxicam again, taking as needed.  Would like to refill this medication.  Requests referral for therapist, has been having some issues finding someone that takes Medicaid.  Today gives me the name of a therapy office she would like to get established with, requests referral.        Observations/Objective: Wt 220 lb (99.8 kg)   BMI 37.76 kg/m  BP Readings from Last 3 Encounters:  09/28/19 117/84  09/21/19 136/76  09/02/19 122/78   Exam: Normal Speech.  NAD  Lab and Radiology Results No results found for this or any previous visit (from the past 72 hour(s)). No results found.     Assessment and Plan: 34 y.o. female with The primary encounter diagnosis was Mood disorder ([place]). A diagnosis of Chronic bilateral low back pain without sciatica was also pertinent to this visit.   PDMP not reviewed this encounter. Orders Placed This Encounter  Procedures  . Ambulatory referral to Behavioral Health    Referral Priority:   Routine    Referral Type:   Psychiatric    Referral Reason:   Specialty Services Required    Requested Specialty:   Behavioral Health    Number of Visits Requested:   1   Meds ordered this encounter  Medications  . meloxicam  (MOBIC) 15 MG tablet    Sig: Take 0.5-1 tablets (7.5-15 mg total) by mouth daily.    Dispense:  90 tablet    Refill:  1  . loratadine (CLARITIN) 10 MG tablet    Sig: Take 1 tablet (10 mg total) by mouth daily.    Dispense:  90 tablet    Refill:  3     Follow Up Instructions: Return if symptoms worsen or fail to improve.    I discussed the assessment and treatment plan with the patient. The patient was provided an opportunity to ask questions and all were answered. The patient agreed with the plan and demonstrated an understanding of the instructions.   The patient was advised to call back or seek an in-person evaluation if any new concerns, if symptoms worsen or if the condition fails to improve as anticipated.  15 minutes of non-face-to-face time was provided during this encounter.      . . . . . . . . . . . . . [name]                   Historical information moved to improve visibility of documentation.  Past Medical History:  Diagnosis Date  . Asthma   . Diabetes mellitus without complication ([place])   . Hypertension   . Mental disorder    Past Surgical History:  Procedure  Laterality Date  . CESAREAN SECTION    . KNEE SURGERY  04/20/2006   Social History   Tobacco Use  . Smoking status: Former Smoker    Packs/day: 0.50    Types: Cigarettes    Quit date: 01/22/2020  . Smokeless tobacco: Never Used  Substance Use Topics  . Alcohol use: Not Currently    Comment: rarely   family history includes Cancer in her maternal grandmother and maternal uncle; Depression in her mother; Diabetes in her maternal uncle and mother; Hyperlipidemia in her father, maternal grandmother, mother, and paternal grandmother; Hypertension in her father, maternal grandmother, and mother.  Medications: Current Outpatient Medications  Medication Sig Dispense Refill  . AIMOVIG 70 MG/ML SOAJ INJECT 70 MG INTO THE SKIN EVERY 30 (THIRTY) DAYS. 1 pen 5  . Blood Glucose  Monitoring Suppl (ACCU-CHEK AVIVA PLUS) w/Device KIT Accu-Chek Aviva Plus Meter  USE AS DIRECTED    . diclofenac sodium (VOLTAREN) 1 % GEL Apply 4 g topically 4 (four) times daily. To affected joint. 100 g 11  . Dulaglutide (TRULICITY) 8.76 OT/1.5BW SOPN Inject 0.75 mg into the skin once a week. 4 pen 11  . escitalopram (LEXAPRO) 10 MG tablet Take 1 tablet (10 mg total) by mouth at bedtime. Take w/ 5 mg tablet for total 15 mg daily 90 tablet 3  . escitalopram (LEXAPRO) 5 MG tablet Take 1 tablet (5 mg total) by mouth daily. Take with 10 mg tablets for total of 15 mg daily 90 tablet 3  . insulin detemir (LEVEMIR) 100 UNIT/ML injection Inject into the skin.    . Insulin Pen Needle (BD PEN NEEDLE NANO U/F) 32G X 4 MM MISC Use with levemir daily    . Insulin Syringe-Needle U-100 31G X 15/64" 0.3 ML MISC by Does not apply route.    [name] levonorgestrel (MIRENA) 20 MCG/24HR IUD 1 each by Intrauterine route once.    . lidocaine (XYLOCAINE) 2 % solution Use as directed 5-10 mLs in the mouth or throat every 3 (three) hours as needed. 100 mL 1  . lisinopril (ZESTRIL) 10 MG tablet Take 1 tablet by mouth daily.    [name] loratadine (CLARITIN) 10 MG tablet Take 1 tablet (10 mg total) by mouth daily. 90 tablet 3  . norgestimate-ethinyl estradiol (ORTHO-CYCLEN) 0.25-35 MG-MCG tablet Take 1 tablet by mouth daily. 1 Package 11  . omeprazole (PRILOSEC) 10 MG capsule TAKE 1 CAPSULE BY MOUTH EVERY DAY 90 capsule 3  . pregabalin (LYRICA) 75 MG capsule TAKE 1 CAPSULE BY MOUTH TWICE A DAY 180 capsule 1  . QUEtiapine (SEROQUEL) 100 MG tablet Take 1 tablet (100 mg total) by mouth at bedtime. 90 tablet 3  . SUMAtriptan (IMITREX) 50 MG tablet Take 0.5-1 tablets (25-50 mg total) by mouth every 2 (two) hours as needed for migraine. May repeat in 2 hours if headache persists or recurs. 10 tablet 0  . varenicline (CHANTIX CONTINUING MONTH PAK) 1 MG tablet Take 1 tablet (1 mg total) by mouth 2 (two) times daily. 180 tablet 1  . meloxicam  (MOBIC) 15 MG tablet Take 0.5-1 tablets (7.5-15 mg total) by mouth daily. 90 tablet 1  . metFORMIN (GLUCOPHAGE XR) 500 MG 24 hr tablet Take 2 tablets (1,000 mg total) by mouth daily with breakfast. 180 tablet 1   No current facility-administered medications for this visit.   Allergies  Allergen Reactions  . Hydrocodone Itching and Nausea And Vomiting    Also nausea and vomiting   . Penicillins Itching, Nausea  And Vomiting and Other (See Comments)  . Abilify [Aripiprazole]     Very restless, extreme bilateral arm pain  . Hydrocodone-Acetaminophen Itching and Nausea And Vomiting  . Penicillin G Itching and Nausea And Vomiting

## 2020-02-07 ENCOUNTER — Encounter: Payer: Self-pay | Admitting: Osteopathic Medicine

## 2020-02-08 ENCOUNTER — Encounter: Payer: Self-pay | Admitting: Osteopathic Medicine

## 2020-02-08 NOTE — Telephone Encounter (Signed)
Routing to provider  

## 2020-02-29 ENCOUNTER — Emergency Department
Admission: EM | Admit: 2020-02-29 | Discharge: 2020-02-29 | Disposition: A | Discharge status: Home / Self Care | Payer: Medicaid Other | Source: Home / Self Care

## 2020-02-29 ENCOUNTER — Emergency Department (INDEPENDENT_AMBULATORY_CARE_PROVIDER_SITE_OTHER): Payer: Medicaid Other

## 2020-02-29 ENCOUNTER — Other Ambulatory Visit: Payer: Self-pay

## 2020-02-29 DIAGNOSIS — S63502A Unspecified sprain of left wrist, initial encounter: Secondary | ICD-10-CM | POA: Diagnosis not present

## 2020-02-29 DIAGNOSIS — M25532 Pain in left wrist: Secondary | ICD-10-CM

## 2020-02-29 NOTE — ED Triage Notes (Signed)
Pt c/o LT wrist pain since Monday after hitting it on the car window. Feels a radiating pain up forearm. Pain 4/10

## 2020-02-29 NOTE — ED Provider Notes (Signed)
[facility]    CSN: [id number] Arrival date & time: 02/29/20  1332      History   Chief Complaint Chief Complaint  Patient presents with  . Wrist Pain    LT    HPI Rachael Fowler is a 34 y.o. female.   HPI Rachael Fowler is a 34 y.o. female presenting to UC with c/o gradually worsening Left wrist pain that started 2 days ago after trying to re-attach a window covering for her 34yo in her car.  Pain is aching and sore, starting on ulnar aspect radiating to her ulnar side and up her forearm. Worse with certain movement. 4/10. She has not tried anything for pain. She is Right hand dominant but reports intermittent bilateral wrist pain for several years.     Past Medical History:  Diagnosis Date  . Asthma   . Diabetes mellitus without complication ([place])   . Hypertension   . Mental disorder     Patient Active Problem List   Diagnosis Date Noted  . Mood disorder ([place]) 07/14/2019  . Patellofemoral syndrome, bilateral 08/17/2018  . Chronic bilateral low back pain without sciatica 08/17/2018  . Pain of left heel 07/14/2018  . Pain of right heel 07/14/2018  . Leukocytosis 07/14/2018  . Hirsutism 05/26/2018  . Abnormal WBC count 05/26/2018  . Psychophysiological insomnia 05/26/2018  . Anxiety and depression 05/26/2018  . Chronic tension-type headache, not intractable 05/26/2018  . Moderate persistent asthma with exacerbation 02/05/2018  . Type 2 diabetes mellitus without complication, without long-term current use of insulin ([place]) 08/19/2017  . Pregnancy 08/19/2017  . OTHER ACUTE REACTIONS TO STRESS 12/04/2009    Past Surgical History:  Procedure Laterality Date  . CESAREAN SECTION    . KNEE SURGERY  04/20/2006    OB History    Gravida  1   Para  1   Term  1   Preterm      AB      Living  1     SAB      TAB      Ectopic      Multiple      Live Births               Home Medications    Prior to Admission medications   Medication Sig  Start Date End Date Taking? Authorizing Provider  AIMOVIG 70 MG/ML SOAJ INJECT 70 MG INTO THE SKIN EVERY 30 (THIRTY) DAYS. 01/17/20   Emeterio Reeve, DO  Blood Glucose Monitoring Suppl (ACCU-CHEK AVIVA PLUS) w/Device KIT Accu-Chek Aviva Plus Meter  USE AS DIRECTED    [provider]  diclofenac sodium (VOLTAREN) 1 % GEL Apply 4 g topically 4 (four) times daily. To affected joint. 07/26/19   Gregor Hams, MD  Dulaglutide (TRULICITY) 7.78 EU/2.3NT SOPN Inject 0.75 mg into the skin once a week. 03/31/19   Emeterio Reeve, DO  escitalopram (LEXAPRO) 10 MG tablet Take 1 tablet (10 mg total) by mouth at bedtime. Take w/ 5 mg tablet for total 15 mg daily 07/14/19   Emeterio Reeve, DO  escitalopram (LEXAPRO) 5 MG tablet Take 1 tablet (5 mg total) by mouth daily. Take with 10 mg tablets for total of 15 mg daily 07/14/19   Emeterio Reeve, DO  insulin detemir (LEVEMIR) 100 UNIT/ML injection Inject into the skin. 06/21/19   [provider]  Insulin Pen Needle (BD PEN NEEDLE NANO U/F) 32G X 4 MM MISC Use with levemir daily 06/21/19   [provider]  Insulin Syringe-Needle U-100 31G X 15/64" 0.3 ML MISC by Does not apply route. 06/27/19   [provider]  levonorgestrel (MIRENA) 20 MCG/24HR IUD 1 each by Intrauterine route once.    [provider]  lidocaine (XYLOCAINE) 2 % solution Use as directed 5-10 mLs in the mouth or throat every 3 (three) hours as needed. 02/24/19   Emeterio Reeve, DO  lisinopril (ZESTRIL) 10 MG tablet Take 1 tablet by mouth daily. 07/27/19   [provider]  loratadine (CLARITIN) 10 MG tablet Take 1 tablet (10 mg total) by mouth daily. 01/25/20   Emeterio Reeve, DO  meloxicam (MOBIC) 15 MG tablet Take 0.5-1 tablets (7.5-15 mg total) by mouth daily. 01/25/20   Emeterio Reeve, DO  metFORMIN (GLUCOPHAGE XR) 500 MG 24 hr tablet Take 2 tablets (1,000 mg total) by mouth daily with breakfast. 11/12/18 11/12/19  Emeterio Reeve, DO  norgestimate-ethinyl estradiol (ORTHO-CYCLEN) 0.25-35 MG-MCG tablet Take 1 tablet by mouth daily. 10/18/19   Rasch, Anderson Malta I, NP  omeprazole (PRILOSEC) 10 MG capsule TAKE 1 CAPSULE BY MOUTH EVERY DAY 08/22/19   Emeterio Reeve, DO  pregabalin (LYRICA) 75 MG capsule TAKE 1 CAPSULE BY MOUTH TWICE A DAY 12/27/19   Emeterio Reeve, DO  QUEtiapine (SEROQUEL) 100 MG tablet Take 1 tablet (100 mg total) by mouth at bedtime. 09/19/19   Emeterio Reeve, DO  SUMAtriptan (IMITREX) 50 MG tablet Take 0.5-1 tablets (25-50 mg total) by mouth every 2 (two) hours as needed for migraine. May repeat in 2 hours if headache persists or recurs. 07/14/19   Emeterio Reeve, DO  varenicline (CHANTIX CONTINUING MONTH PAK) 1 MG tablet Take 1 tablet (1 mg total) by mouth 2 (two) times daily. 09/20/19   Emeterio Reeve, DO    Family History Family History  Problem Relation Age of Onset  . Depression Mother   . Diabetes Mother   . Hyperlipidemia Mother   . Hypertension Mother   . Hyperlipidemia Father   . Hypertension Father   . Cancer Maternal Uncle   . Diabetes Maternal Uncle   . Cancer Maternal Grandmother   . Hypertension Maternal Grandmother   . Hyperlipidemia Maternal Grandmother   . Hyperlipidemia Paternal Grandmother     Social History Social History   Tobacco Use  . Smoking status: Former Smoker    Packs/day: 0.50    Types: Cigarettes    Quit date: 01/22/2020    Years since quitting: 0.1  . Smokeless tobacco: Never Used  Substance Use Topics  . Alcohol use: Not Currently    Comment: rarely  . Drug use: Never     Allergies   Hydrocodone, Penicillins, Abilify [aripiprazole], Hydrocodone-acetaminophen, and Penicillin g   Review of Systems Review of Systems  Musculoskeletal: Positive for arthralgias. Negative for joint swelling.  Skin: Negative for color change and wound.  Neurological: Negative for weakness and numbness.     Physical Exam Triage Vital  Signs ED Triage Vitals  Enc Vitals Group     BP 02/29/20 1344 (!) 146/89     Pulse Rate 02/29/20 1344 87     Resp 02/29/20 1344 18     Temp 02/29/20 1344 98.6 F (37 C)     Temp Source 02/29/20 1344 Oral     SpO2 02/29/20 1344 96 %     Weight --      Height --      Head Circumference --      Peak Flow --  Pain Score 02/29/20 1347 4     Pain Loc --      Pain Edu? --      Excl. in [place]? --    No data found.  Updated Vital Signs BP (!) 146/89 (BP Location: Right Arm)   Pulse 87   Temp 98.6 F (37 C) (Oral)   Resp 18   SpO2 96%   Visual Acuity Right Eye Distance:   Left Eye Distance:   Bilateral Distance:    Right Eye Near:   Left Eye Near:    Bilateral Near:     Physical Exam Vitals and nursing note reviewed.  Constitutional:      Appearance: She is well-developed.  HENT:     Head: Normocephalic and atraumatic.  Cardiovascular:     Rate and Rhythm: Normal rate and regular rhythm.     Pulses:          Radial pulses are 2+ on the left side.  Pulmonary:     Effort: Pulmonary effort is normal.  Musculoskeletal:        General: Tenderness present. No swelling. Normal range of motion.     Cervical back: Normal range of motion.     Comments: Left wrist: no obvious edema or deformity. Tenderness over ulnar prominence. Full ROM  Skin:    General: Skin is warm and dry.     Capillary Refill: Capillary refill takes less than 2 seconds.  Neurological:     Mental Status: She is alert and oriented to person, place, and time.     Sensory: No sensory deficit.  Psychiatric:        Behavior: Behavior normal.      UC Treatments / Results  Labs (all labs ordered are listed, but only abnormal results are displayed) Labs Reviewed - No data to display  EKG   Radiology DG Wrist Complete Left  Result Date: 02/29/2020 CLINICAL DATA:  Hit wall, pain in wrist EXAM: LEFT WRIST - COMPLETE 3+ VIEW COMPARISON:  None. FINDINGS: There is no evidence of fracture or dislocation.  There is no evidence of arthropathy or other focal bone abnormality. Soft tissues are unremarkable. IMPRESSION: No fracture or dislocation of the left wrist. The carpus is normally aligned. Soft tissues are unremarkable. Electronically Signed   By: Eddie Candle M.D.   On: 02/29/2020 14:24    Procedures Procedures (including critical care time)  Medications Ordered in UC Medications - No data to display  Initial Impression / Assessment and Plan / UC Course  I have reviewed the triage vital signs and the nursing notes.  Pertinent labs & imaging results that were available during my care of the patient were reviewed by me and considered in my medical decision making (see chart for details).     Reviewed imaging with pt No evidence of arthritis or acute injury Splint provided for comfort F/u with Sports Medicine as needed AVS provided  Final Clinical Impressions(s) / UC Diagnoses   Final diagnoses:  Sprain of left wrist, initial encounter  Left wrist pain     Discharge Instructions      You may wear the brace for comfort, apply a cool compress 2-3 times daily for up to 20 minutes at a time as needed as well as take 562m acetaminophen every 4-6 hours or in combination with ibuprofen 400-6057mevery 6-8 hours as needed for pain.   Call to schedule a follow up appointment with Sports Medicine in 1-2 weeks if not improving.  ED Prescriptions    None     PDMP not reviewed this encounter.   Noe Gens, [place] 02/29/20 1437

## 2020-02-29 NOTE — Discharge Instructions (Signed)
  You may wear the brace for comfort, apply a cool compress 2-3 times daily for up to 20 minutes at a time as needed as well as take 500mg  acetaminophen every 4-6 hours or in combination with ibuprofen 400-600mg  every 6-8 hours as needed for pain.   Call to schedule a follow up appointment with Sports Medicine in 1-2 weeks if not improving.

## 2020-04-10 ENCOUNTER — Emergency Department (INDEPENDENT_AMBULATORY_CARE_PROVIDER_SITE_OTHER): Payer: Medicaid Other

## 2020-04-10 ENCOUNTER — Other Ambulatory Visit: Payer: Self-pay

## 2020-04-10 ENCOUNTER — Emergency Department (INDEPENDENT_AMBULATORY_CARE_PROVIDER_SITE_OTHER)
Admission: EM | Admit: 2020-04-10 | Discharge: 2020-04-10 | Disposition: A | Discharge status: Home / Self Care | Payer: Medicaid Other | Source: Home / Self Care | Attending: Family Medicine | Admitting: Family Medicine

## 2020-04-10 DIAGNOSIS — M25531 Pain in right wrist: Secondary | ICD-10-CM | POA: Diagnosis not present

## 2020-04-10 DIAGNOSIS — M67431 Ganglion, right wrist: Secondary | ICD-10-CM

## 2020-04-10 NOTE — ED Triage Notes (Signed)
Patient presents to Urgent Care with complaints of right wrist pain since sometime last week. Patient reports she cannot bend it very far without causing pain, denies injury. Pt has not been taking anything for pain.

## 2020-04-10 NOTE — ED Provider Notes (Signed)
[facility]    CSN: [id number] Arrival date & time: 04/10/20  1108      History   Chief Complaint Chief Complaint  Patient presents with  . Wrist Pain    HPI Rachael Fowler is a 34 y.o. female.   Patient complains of pain in her right dorsal wrist for about a week.  She has pain with flexion/extension.  She denies injury and repetitive motions of her wrist   The history is provided by the patient.  Wrist Pain This is a new problem. Episode onset: One week ago. The problem occurs constantly. The problem has not changed since onset.Exacerbated by: flexion/extension. Nothing relieves the symptoms. She has tried nothing for the symptoms.    Past Medical History:  Diagnosis Date  . Asthma   . Diabetes mellitus without complication ([place])   . Hypertension   . Mental disorder     Patient Active Problem List   Diagnosis Date Noted  . Mood disorder ([place]) 07/14/2019  . Patellofemoral syndrome, bilateral 08/17/2018  . Chronic bilateral low back pain without sciatica 08/17/2018  . Pain of left heel 07/14/2018  . Pain of right heel 07/14/2018  . Leukocytosis 07/14/2018  . Hirsutism 05/26/2018  . Abnormal WBC count 05/26/2018  . Psychophysiological insomnia 05/26/2018  . Anxiety and depression 05/26/2018  . Chronic tension-type headache, not intractable 05/26/2018  . Moderate persistent asthma with exacerbation 02/05/2018  . Type 2 diabetes mellitus without complication, without long-term current use of insulin ([place]) 08/19/2017  . Pregnancy 08/19/2017  . OTHER ACUTE REACTIONS TO STRESS 12/04/2009    Past Surgical History:  Procedure Laterality Date  . CESAREAN SECTION    . KNEE SURGERY  04/20/2006    OB History    Gravida  1   Para  1   Term  1   Preterm      AB      Living  1     SAB      TAB      Ectopic      Multiple      Live Births               Home Medications    Prior to Admission medications   Medication Sig Start Date  End Date Taking? Authorizing Provider  AIMOVIG 70 MG/ML SOAJ INJECT 70 MG INTO THE SKIN EVERY 30 (THIRTY) DAYS. 01/17/20   Emeterio Reeve, DO  Blood Glucose Monitoring Suppl (ACCU-CHEK AVIVA PLUS) w/Device KIT Accu-Chek Aviva Plus Meter  USE AS DIRECTED    [provider]  diclofenac sodium (VOLTAREN) 1 % GEL Apply 4 g topically 4 (four) times daily. To affected joint. 07/26/19   Gregor Hams, MD  Dulaglutide (TRULICITY) 3.53 GD/9.2EQ SOPN Inject 0.75 mg into the skin once a week. 03/31/19   Emeterio Reeve, DO  escitalopram (LEXAPRO) 10 MG tablet Take 1 tablet (10 mg total) by mouth at bedtime. Take w/ 5 mg tablet for total 15 mg daily 07/14/19   Emeterio Reeve, DO  escitalopram (LEXAPRO) 5 MG tablet Take 1 tablet (5 mg total) by mouth daily. Take with 10 mg tablets for total of 15 mg daily 07/14/19   Emeterio Reeve, DO  insulin detemir (LEVEMIR) 100 UNIT/ML injection Inject into the skin. 06/21/19   [provider]  Insulin Pen Needle (BD PEN NEEDLE NANO U/F) 32G X 4 MM MISC Use with levemir daily 06/21/19   [provider]  Insulin Syringe-Needle U-100 31G X 15/64" 0.3 ML  MISC by Does not apply route. 06/27/19   [provider]  levonorgestrel (MIRENA) 20 MCG/24HR IUD 1 each by Intrauterine route once.    [provider]  lidocaine (XYLOCAINE) 2 % solution Use as directed 5-10 mLs in the mouth or throat every 3 (three) hours as needed. 02/24/19   Emeterio Reeve, DO  lisinopril (ZESTRIL) 10 MG tablet Take 1 tablet by mouth daily. 07/27/19   [provider]  loratadine (CLARITIN) 10 MG tablet Take 1 tablet (10 mg total) by mouth daily. 01/25/20   Emeterio Reeve, DO  meloxicam (MOBIC) 15 MG tablet Take 0.5-1 tablets (7.5-15 mg total) by mouth daily. 01/25/20   Emeterio Reeve, DO  metFORMIN (GLUCOPHAGE XR) 500 MG 24 hr tablet Take 2 tablets (1,000 mg total) by mouth daily with breakfast. 11/12/18 11/12/19  Emeterio Reeve, DO    norgestimate-ethinyl estradiol (ORTHO-CYCLEN) 0.25-35 MG-MCG tablet Take 1 tablet by mouth daily. 10/18/19   Rasch, Anderson Malta I, NP  omeprazole (PRILOSEC) 10 MG capsule TAKE 1 CAPSULE BY MOUTH EVERY DAY 08/22/19   Emeterio Reeve, DO  pregabalin (LYRICA) 75 MG capsule TAKE 1 CAPSULE BY MOUTH TWICE A DAY 12/27/19   Emeterio Reeve, DO  QUEtiapine (SEROQUEL) 100 MG tablet Take 1 tablet (100 mg total) by mouth at bedtime. 09/19/19   Emeterio Reeve, DO  SUMAtriptan (IMITREX) 50 MG tablet Take 0.5-1 tablets (25-50 mg total) by mouth every 2 (two) hours as needed for migraine. May repeat in 2 hours if headache persists or recurs. 07/14/19   Emeterio Reeve, DO  varenicline (CHANTIX CONTINUING MONTH PAK) 1 MG tablet Take 1 tablet (1 mg total) by mouth 2 (two) times daily. 09/20/19   Emeterio Reeve, DO    Family History Family History  Problem Relation Age of Onset  . Depression Mother   . Diabetes Mother   . Hyperlipidemia Mother   . Hypertension Mother   . Hyperlipidemia Father   . Hypertension Father   . Cancer Maternal Uncle   . Diabetes Maternal Uncle   . Cancer Maternal Grandmother   . Hypertension Maternal Grandmother   . Hyperlipidemia Maternal Grandmother   . Hyperlipidemia Paternal Grandmother     Social History Social History   Tobacco Use  . Smoking status: Former Smoker    Packs/day: 0.50    Types: Cigarettes    Quit date: 01/22/2020    Years since quitting: 0.2  . Smokeless tobacco: Never Used  Vaping Use  . Vaping Use: Never used  Substance Use Topics  . Alcohol use: Not Currently    Comment: rarely  . Drug use: Never     Allergies   Hydrocodone, Penicillins, Abilify [aripiprazole], Hydrocodone-acetaminophen, and Penicillin g   Review of Systems Review of Systems  Musculoskeletal: Negative for joint swelling.  Skin: Negative for color change and rash.  All other systems reviewed and are negative.    Physical Exam Triage Vital Signs ED  Triage Vitals  Enc Vitals Group     BP 04/10/20 1120 132/85     Pulse Rate 04/10/20 1120 86     Resp 04/10/20 1120 18     Temp 04/10/20 1120 97.9 F (36.6 C)     Temp Source 04/10/20 1120 Oral     SpO2 04/10/20 1120 96 %     Weight --      Height --      Head Circumference --      Peak Flow --      Pain Score 04/10/20 1118 6  Pain Loc --      Pain Edu? --      Excl. in [place]? --    No data found.  Updated Vital Signs BP 132/85 (BP Location: Left Arm)   Pulse 86   Temp 97.9 F (36.6 C) (Oral)   Resp 18   SpO2 96%   Visual Acuity Right Eye Distance:   Left Eye Distance:   Bilateral Distance:    Right Eye Near:   Left Eye Near:    Bilateral Near:     Physical Exam Vitals and nursing note reviewed.  Constitutional:      General: She is not in acute distress. Cardiovascular:     Rate and Rhythm: Normal rate.  Pulmonary:     Effort: Pulmonary effort is normal.  Musculoskeletal:       Hands:     Comments: Tenderness to palpation right dorsal wrist.  There is no swelling, erythema or warmth.  Approximately 65m diameter ganglion cyst palpated over extensor tendon at location as noted on diagram.   Skin:    General: Skin is warm and dry.  Neurological:     Mental Status: She is alert.      UC Treatments / Results  Labs (all labs ordered are listed, but only abnormal results are displayed) Labs Reviewed - No data to display  EKG   Radiology DG Wrist Complete Right  Result Date: 04/10/2020 CLINICAL DATA:  Pain and tenderness EXAM: RIGHT WRIST - COMPLETE 3+ VIEW COMPARISON:  None. FINDINGS: Frontal, oblique, lateral, and ulnar deviation scaphoid images were obtained. No fracture or dislocation. Joint spaces appear normal. No erosive change. IMPRESSION: No fracture or dislocation.  No evident arthropathy. Electronically Signed   By: WLowella GripIII M.D.   On: 04/10/2020 12:22    Procedures Procedures (including critical care time)  Medications Ordered  in UC Medications - No data to display  Initial Impression / Assessment and Plan / UC Course  I have reviewed the triage vital signs and the nursing notes.  Pertinent labs & imaging results that were available during my care of the patient were reviewed by me and considered in my medical decision making (see chart for details).    Dispensed thumb spica splint. [facility] for [facility] and management.   Final Clinical Impressions(s) / UC Diagnoses   Final diagnoses:  Right wrist pain  Ganglion of right wrist     Discharge Instructions     Wear splint.  May take Tylenol as needed for pain.    ED Prescriptions    None        BKandra Nicolas MD 04/11/20 1910

## 2020-04-10 NOTE — Discharge Instructions (Signed)
Wear splint.  May take Tylenol as needed for pain.

## 2020-04-12 ENCOUNTER — Other Ambulatory Visit: Payer: Self-pay

## 2020-04-12 ENCOUNTER — Encounter: Payer: Self-pay | Admitting: Osteopathic Medicine

## 2020-04-12 ENCOUNTER — Ambulatory Visit (INDEPENDENT_AMBULATORY_CARE_PROVIDER_SITE_OTHER): Payer: Medicaid Other | Admitting: Osteopathic Medicine

## 2020-04-12 ENCOUNTER — Ambulatory Visit: Payer: Medicaid Other | Admitting: Osteopathic Medicine

## 2020-04-12 VITALS — BP 119/87 | HR 81 | Temp 98.0°F | Wt 226.4 lb

## 2020-04-12 DIAGNOSIS — E119 Type 2 diabetes mellitus without complications: Secondary | ICD-10-CM

## 2020-04-12 DIAGNOSIS — R5382 Chronic fatigue, unspecified: Secondary | ICD-10-CM | POA: Diagnosis not present

## 2020-04-12 DIAGNOSIS — Z8639 Personal history of other endocrine, nutritional and metabolic disease: Secondary | ICD-10-CM

## 2020-04-12 DIAGNOSIS — F39 Unspecified mood [affective] disorder: Secondary | ICD-10-CM | POA: Diagnosis not present

## 2020-04-12 DIAGNOSIS — R41840 Attention and concentration deficit: Secondary | ICD-10-CM | POA: Diagnosis not present

## 2020-04-12 DIAGNOSIS — E282 Polycystic ovarian syndrome: Secondary | ICD-10-CM

## 2020-04-12 NOTE — Patient Instructions (Signed)
I

## 2020-04-12 NOTE — Progress Notes (Signed)
Rachael Fowler is a 34 y.o. female who presents to  [facility]  today, 04/12/20, seeking care for the following: . Significant fatigue ongoing for about 6 months, nothing making it particularly worse over the past weeks/months.  Normal sleep, no snoring, no jerking awake, wakes feeling relatively well rested.  No serious concerns with anxiety/depression.  She did decrease her Seroquel a bit and noted some improvement in sleepiness but overall since seems more irritable.  Reports some feeling of short winded with exertion like climbing long flights of stairs, this is normal for her, she does not regularly exercise.  Good news, she has quit smoking!  No chest pain/palpitations.  No lightheadedness/presyncope/LOC. . Mental health: Concerned about possible ADHD, concerned about mood disorder.  Would like referral to psychiatry for help with medication management and possible testing for ADD      ASSESSMENT & PLAN with other pertinent history/findings:  The primary encounter diagnosis was Chronic fatigue. Diagnoses of Mood disorder ([place]), Concentration deficit, History of vitamin D deficiency, Type 2 diabetes mellitus without complication, without long-term current use of insulin ([place]), and PCOS (polycystic ovarian syndrome) were also pertinent to this visit.  CV: RRR, normal S1-S2, no murmur CTA BL NAD  Orders Placed This Encounter  Procedures  . CBC  . COMPLETE METABOLIC PANEL WITH GFR  . TSH  . Vitamin B12  . VITAMIN D 25 Hydroxy (Vit-D Deficiency, Fractures)  . Follicle stimulating hormone  . Luteinizing hormone  . Testosterone  . Ambulatory referral to Behavioral Health        Follow-up instructions: Return for RECHECK PENDING RESULTS / IF WORSE OR CHANGE.                                         BP 119/87 (BP Location: Left Arm, Patient Position: Sitting, Cuff Size: Large)   Pulse 81    Temp 98 F (36.7 C) (Oral)   Wt 226 lb 6.4 oz (102.7 kg)   BMI 38.86 kg/m   Current Meds  Medication Sig  . AIMOVIG 70 MG/ML SOAJ INJECT 70 MG INTO THE SKIN EVERY 30 (THIRTY) DAYS.  [name] Blood Glucose Monitoring Suppl (ACCU-CHEK AVIVA PLUS) w/Device KIT Accu-Chek Aviva Plus Meter  USE AS DIRECTED  . diclofenac sodium (VOLTAREN) 1 % GEL Apply 4 g topically 4 (four) times daily. To affected joint.  . Dulaglutide (TRULICITY) 6.33 HL/4.5GY SOPN Inject 0.75 mg into the skin once a week.  . escitalopram (LEXAPRO) 10 MG tablet Take 1 tablet (10 mg total) by mouth at bedtime. Take w/ 5 mg tablet for total 15 mg daily  . escitalopram (LEXAPRO) 5 MG tablet Take 1 tablet (5 mg total) by mouth daily. Take with 10 mg tablets for total of 15 mg daily  . insulin detemir (LEVEMIR) 100 UNIT/ML injection Inject into the skin.  . Insulin Pen Needle (BD PEN NEEDLE NANO U/F) 32G X 4 MM MISC Use with levemir daily  . Insulin Syringe-Needle U-100 31G X 15/64" 0.3 ML MISC by Does not apply route.  [name] levonorgestrel (MIRENA) 20 MCG/24HR IUD 1 each by Intrauterine route once.  . lidocaine (XYLOCAINE) 2 % solution Use as directed 5-10 mLs in the mouth or throat every 3 (three) hours as needed.  [name] lisinopril (ZESTRIL) 10 MG tablet Take 1 tablet by mouth daily.  [name] loratadine (CLARITIN) 10 MG  tablet Take 1 tablet (10 mg total) by mouth daily.  . meloxicam (MOBIC) 15 MG tablet Take 0.5-1 tablets (7.5-15 mg total) by mouth daily.  . norgestimate-ethinyl estradiol (ORTHO-CYCLEN) 0.25-35 MG-MCG tablet Take 1 tablet by mouth daily.  [name] omeprazole (PRILOSEC) 10 MG capsule TAKE 1 CAPSULE BY MOUTH EVERY DAY  . pregabalin (LYRICA) 75 MG capsule TAKE 1 CAPSULE BY MOUTH TWICE A DAY  . QUEtiapine (SEROQUEL) 100 MG tablet Take 1 tablet (100 mg total) by mouth at bedtime.  . SUMAtriptan (IMITREX) 50 MG tablet Take 0.5-1 tablets (25-50 mg total) by mouth every 2 (two) hours as needed for migraine. May repeat in 2 hours if headache  persists or recurs.  . varenicline (CHANTIX CONTINUING MONTH PAK) 1 MG tablet Take 1 tablet (1 mg total) by mouth 2 (two) times daily.    No results found for this or any previous visit (from the past 72 hour(s)).  DG Wrist Complete Right  Result Date: 04/10/2020 CLINICAL DATA:  Pain and tenderness EXAM: RIGHT WRIST - COMPLETE 3+ VIEW COMPARISON:  None. FINDINGS: Frontal, oblique, lateral, and ulnar deviation scaphoid images were obtained. No fracture or dislocation. Joint spaces appear normal. No erosive change. IMPRESSION: No fracture or dislocation.  No evident arthropathy. Electronically Signed   By: Lowella Grip III M.D.   On: 04/10/2020 12:22    Depression screen [facility] 2/9 01/25/2020 09/19/2019 07/14/2019  Decreased Interest _0 Down, Depressed, Hopeless 0 1 2  PHQ - 2 Score _1 Altered sleeping 1 0 0  Tired, decreased energy _2 Change in appetite _3 Feeling bad or failure about yourself  0 0 1  Trouble concentrating _4 Moving slowly or fidgety/restless 1 0 2  Suicidal thoughts 0 0 0  PHQ-9 Score _5 Difficult doing work/chores Somewhat difficult Somewhat difficult Extremely dIfficult    GAD 7 : Generalized Anxiety Score 01/25/2020 09/19/2019 07/14/2019 12/10/2018  Nervous, Anxious, on Edge _6 Control/stop worrying 0 _7 Worry too much - different things 0 _8 Trouble relaxing _9 Restless _10 Easily annoyed or irritable _11 Afraid - awful might happen 0 1 1 0  Total GAD 7 Score _12 Anxiety Difficulty Somewhat difficult Somewhat difficult Extremely difficult Somewhat difficult      All questions at time of visit were answered - patient instructed to contact office with any additional concerns or updates.  ER/RTC precautions were reviewed with the patient.  Please note: voice recognition software was used to produce this document, and typos may escape review. Please contact Dr. Sheppard Coil for any needed clarifications.

## 2020-04-13 MED ORDER — VITAMIN D (ERGOCALCIFEROL) 1.25 MG (50000 UNIT) PO CAPS: 50000.0000 [IU] | ORAL_CAPSULE | ORAL | 0 refills | Status: DC

## 2020-04-13 NOTE — Addendum Note (Signed)
Addended by: Deirdre Pippins on: 04/13/2020 10:33 AM   Modules accepted: Orders

## 2020-04-16 ENCOUNTER — Ambulatory Visit: Payer: Medicaid Other | Admitting: Sports Medicine

## 2020-04-16 ENCOUNTER — Telehealth: Payer: Self-pay | Admitting: Osteopathic Medicine

## 2020-04-16 LAB — FOLLICLE STIMULATING HORMONE: FSH: 8.2 m[IU]/mL

## 2020-04-16 LAB — COMPLETE METABOLIC PANEL WITH GFR
AG Ratio: 2 (calc) (ref 1.0–2.5)
ALT: 41 U/L — ABNORMAL HIGH (ref 6–29)
AST: 59 U/L — ABNORMAL HIGH (ref 10–30)
Albumin: 4.1 g/dL (ref 3.6–5.1)
Alkaline phosphatase (APISO): 65 U/L (ref 31–125)
BUN: 11 mg/dL (ref 7–25)
CO2: 25 mmol/L (ref 20–32)
Calcium: 9 mg/dL (ref 8.6–10.2)
Chloride: 103 mmol/L (ref 98–110)
Creat: 0.59 mg/dL (ref 0.50–1.10)
GFR, Est African American: 142 mL/min/{1.73_m2} (ref >=60)
GFR, Est Non African American: 122 mL/min/{1.73_m2} (ref >=60)
Globulin: 2.1 g/dL (calc) (ref 1.9–3.7)
Glucose, Bld: 260 mg/dL — ABNORMAL HIGH (ref 65–99)
Potassium: 4.3 mmol/L (ref 3.5–5.3)
Sodium: 138 mmol/L (ref 135–146)
Total Bilirubin: 0.3 mg/dL (ref 0.2–1.2)
Total Protein: 6.2 g/dL (ref 6.1–8.1)

## 2020-04-16 LAB — VITAMIN B12: Vitamin B-12: 329 pg/mL (ref 200–1100)

## 2020-04-16 LAB — CBC
HCT: 40.8 % (ref 35.0–45.0)
Hemoglobin: 13.8 g/dL (ref 11.7–15.5)
MCH: 29.6 pg (ref 27.0–33.0)
MCHC: 33.8 g/dL (ref 32.0–36.0)
MCV: 87.6 fL (ref 80.0–100.0)
MPV: 10.4 fL (ref 7.5–12.5)
Platelets: 379 10*3/uL (ref 140–400)
RBC: 4.66 10*6/uL (ref 3.80–5.10)
RDW: 12.2 % (ref 11.0–15.0)
WBC: 10.5 10*3/uL (ref 3.8–10.8)

## 2020-04-16 LAB — VITAMIN D 25 HYDROXY (VIT D DEFICIENCY, FRACTURES): Vit D, 25-Hydroxy: 20 ng/mL — ABNORMAL LOW (ref 30–100)

## 2020-04-16 LAB — LUTEINIZING HORMONE: LH: 5.7 m[IU]/mL

## 2020-04-16 LAB — TESTOSTERONE, TOTAL, LC/MS/MS: Testosterone, Total, LC-MS-MS: 35 ng/dL (ref 2–45)

## 2020-04-16 LAB — TSH: TSH: 1.88 mIU/L

## 2020-04-16 NOTE — Telephone Encounter (Signed)
Mom called at 8:50 to cancel appt scheduled for 3:15. Pt has been taken off the schedule.  Thanks.

## 2020-04-26 ENCOUNTER — Encounter: Payer: Self-pay | Admitting: Osteopathic Medicine

## 2020-05-18 ENCOUNTER — Telehealth: Payer: Self-pay | Admitting: Osteopathic Medicine

## 2020-05-18 NOTE — Telephone Encounter (Signed)
Received fax from Covermymeds that Aimovig requires a PA. Information has been sent to the insurance company. Awaiting determination.   

## 2020-05-23 NOTE — Telephone Encounter (Signed)
Aimovig was denied. We are unable to approve your request. We reviewed your case. We need the following: 1. Beneficiary does not have medication over-use headache [facility]); 2. Beneficiaries that are women of childbearing age have had a negative pregnancy test at baseline; 3. Beneficiary has 4 or more migraine days per month for at least 3 months; 4. Beneficiary is utilizing prophylactic intervention modalities (e.g. behavioral therapy, physical therapy, life-style modifications) ; 5. Beneficiary has tried and failed at least a month or greater trial of medications from at least 2 different classes from the following list of oral medications: a. Antidepressants (e.g. amitriptyline, venlafaxine) b. Beta Blockers (e.g. propranolol, metoprolol, timolol, atenolol) c. Anti-epileptics (e.g. valproate, topiramate) d. Angiotensin converting enzyme inhibitors/angiotensin II receptor blockers (e.g. lisinopril, candesartan) e. Calcium Channel Blockers (e.g. verapamil, nimodipine).  Please advise.

## 2020-05-28 ENCOUNTER — Encounter: Payer: Self-pay | Admitting: Osteopathic Medicine

## 2020-05-30 ENCOUNTER — Other Ambulatory Visit: Payer: Self-pay

## 2020-05-30 ENCOUNTER — Encounter: Payer: Self-pay | Admitting: Sports Medicine

## 2020-05-30 ENCOUNTER — Ambulatory Visit (INDEPENDENT_AMBULATORY_CARE_PROVIDER_SITE_OTHER): Payer: Medicaid Other | Admitting: Sports Medicine

## 2020-05-30 DIAGNOSIS — M25531 Pain in right wrist: Secondary | ICD-10-CM | POA: Insufficient documentation

## 2020-05-30 MED ORDER — MELOXICAM 15 MG PO TABS: ORAL_TABLET | ORAL | 3 refills | Status: DC

## 2020-05-30 NOTE — Progress Notes (Signed)
    Procedures performed today:    None.  Independent interpretation of notes and tests performed by another provider:   X-rays personally reviewed and are unremarkable.  Brief History, Exam, Impression, and Recommendations:    Wrist pain, acute, right This is a very pleasant 35 year old female, she has a toddler, over the past several days she has developed pain over the dorsal lateral aspect of her right wrist, there is a small palpable ganglion cyst in close approximation to the third extensor compartment. She was provided a thumb spica brace which has not helped. We will discontinue the brace, I am going to add wrist tendinitis rehab exercises, meloxicam, return to see me in 1 month, third extensor compartment injection if no better.    ___________________________________________ Ihor Austin. Benjamin Stain, M.D., ABFM., CAQSM. Primary Care and Sports Medicine [facility]  Adjunct Instructor of Family Medicine  [employer]

## 2020-05-30 NOTE — Assessment & Plan Note (Signed)
This is a very pleasant 34 year old female, she has a toddler, over the past several days she has developed pain over the dorsal lateral aspect of her right wrist, there is a small palpable ganglion cyst in close approximation to the third extensor compartment. She was provided a thumb spica brace which has not helped. We will discontinue the brace, I am going to add wrist tendinitis rehab exercises, meloxicam, return to see me in 1 month, third extensor compartment injection if no better.

## 2020-06-04 ENCOUNTER — Ambulatory Visit: Payer: Medicaid Other | Admitting: Osteopathic Medicine

## 2020-06-07 ENCOUNTER — Other Ambulatory Visit: Payer: Self-pay

## 2020-06-07 ENCOUNTER — Encounter: Payer: Self-pay | Admitting: Osteopathic Medicine

## 2020-06-07 ENCOUNTER — Ambulatory Visit (INDEPENDENT_AMBULATORY_CARE_PROVIDER_SITE_OTHER): Payer: Medicaid Other | Admitting: Osteopathic Medicine

## 2020-06-07 VITALS — BP 109/72 | HR 83 | Wt 224.0 lb

## 2020-06-07 DIAGNOSIS — R6889 Other general symptoms and signs: Secondary | ICD-10-CM | POA: Diagnosis not present

## 2020-06-07 NOTE — Patient Instructions (Signed)
Ask to speak to referral coordinator for ADHD eval and ask if they have everything they need from [place]  Re: Aimovig, contact your insurance to check prior auth status, ask if they cover alternative Emgality or Ajovy.

## 2020-06-07 NOTE — Progress Notes (Signed)
HPI: Rachael Fowler is a 34 y.o. female who  has a past medical history of Asthma, Diabetes mellitus without complication ([place]), Hypertension, and Mental disorder.  she presents to [facility] today, 06/07/20,  for chief complaint of: Heat sensitivity  Patient reports she has recently been more sensitive to heat than normal. She states she is unable to do much outside without feeling sick and needing to come inside to cool off. She also reports one particularly bad episode while she was at the beach. She states she was hydrating throughout the day, but she still felt sick in the heat and vomited when she got home. She states her blood sugars were normal during this time. She also endorses mild chest discomfort and SOB in the heat. She quit smoking cigarettes in March, but states her breathing maybe feels worse in the heat. She reports a history of asthma, which she states is worse in the heat, but she does not take medication for this. She reports extensive thyroid disease history in her mother's family, but she has been tested regularly with no abnormalities.   Past medical, surgical, social and family history reviewed:  Patient Active Problem List   Diagnosis Date Noted  . Wrist pain, acute, right 05/30/2020  . Mood disorder ([place]) 07/14/2019  . Patellofemoral syndrome, bilateral 08/17/2018  . Chronic bilateral low back pain without sciatica 08/17/2018  . Pain of left heel 07/14/2018  . Pain of right heel 07/14/2018  . Leukocytosis 07/14/2018  . Hirsutism 05/26/2018  . Abnormal WBC count 05/26/2018  . Psychophysiological insomnia 05/26/2018  . Anxiety and depression 05/26/2018  . Chronic tension-type headache, not intractable 05/26/2018  . Moderate persistent asthma with exacerbation 02/05/2018  . Type 2 diabetes mellitus without complication, without long-term current use of insulin ([place]) 08/19/2017  . Pregnancy 08/19/2017  . OTHER ACUTE REACTIONS TO  STRESS 12/04/2009    Past Surgical History:  Procedure Laterality Date  . CESAREAN SECTION    . KNEE SURGERY  04/20/2006    Social History   Tobacco Use  . Smoking status: Former Smoker    Packs/day: 0.50    Types: Cigarettes    Quit date: 01/22/2020    Years since quitting: 0.3  . Smokeless tobacco: Never Used  Substance Use Topics  . Alcohol use: Not Currently    Comment: rarely    Family History  Problem Relation Age of Onset  . Depression Mother   . Diabetes Mother   . Hyperlipidemia Mother   . Hypertension Mother   . Hyperlipidemia Father   . Hypertension Father   . Cancer Maternal Uncle   . Diabetes Maternal Uncle   . Cancer Maternal Grandmother   . Hypertension Maternal Grandmother   . Hyperlipidemia Maternal Grandmother   . Hyperlipidemia Paternal Grandmother      Current medication list and allergy/intolerance information reviewed:    Current Outpatient Medications  Medication Sig Dispense Refill  . AIMOVIG 70 MG/ML SOAJ INJECT 70 MG INTO THE SKIN EVERY 30 (THIRTY) DAYS. 1 pen 5  . Blood Glucose Monitoring Suppl (ACCU-CHEK AVIVA PLUS) w/Device KIT Accu-Chek Aviva Plus Meter  USE AS DIRECTED    . diclofenac sodium (VOLTAREN) 1 % GEL Apply 4 g topically 4 (four) times daily. To affected joint. 100 g 11  . Dulaglutide (TRULICITY) 2.59 DG/3.8VF SOPN Inject 0.75 mg into the skin once a week. 4 pen 11  . escitalopram (LEXAPRO) 10 MG tablet Take 1 tablet (10 mg total) by mouth  at bedtime. Take w/ 5 mg tablet for total 15 mg daily 90 tablet 3  . escitalopram (LEXAPRO) 5 MG tablet Take 1 tablet (5 mg total) by mouth daily. Take with 10 mg tablets for total of 15 mg daily 90 tablet 3  . insulin detemir (LEVEMIR) 100 UNIT/ML injection Inject 70 Units into the skin.     . Insulin Pen Needle (BD PEN NEEDLE NANO U/F) 32G X 4 MM MISC Use with levemir daily    . Insulin Syringe-Needle U-100 31G X 15/64" 0.3 ML MISC by Does not apply route.    [name] levonorgestrel (MIRENA) 20  MCG/24HR IUD 1 each by Intrauterine route once.    . lidocaine (XYLOCAINE) 2 % solution Use as directed 5-10 mLs in the mouth or throat every 3 (three) hours as needed. 100 mL 1  . lisinopril (ZESTRIL) 10 MG tablet Take 1 tablet by mouth daily.    [name] loratadine (CLARITIN) 10 MG tablet Take 1 tablet (10 mg total) by mouth daily. 90 tablet 3  . meloxicam (MOBIC) 15 MG tablet One tab PO qAM with a meal for 2 weeks, then daily prn pain. 30 tablet 3  . omeprazole (PRILOSEC) 10 MG capsule TAKE 1 CAPSULE BY MOUTH EVERY DAY 90 capsule 3  . pregabalin (LYRICA) 75 MG capsule TAKE 1 CAPSULE BY MOUTH TWICE A DAY 180 capsule 1  . QUEtiapine (SEROQUEL) 100 MG tablet Take 1 tablet (100 mg total) by mouth at bedtime. 90 tablet 3  . SUMAtriptan (IMITREX) 50 MG tablet Take 0.5-1 tablets (25-50 mg total) by mouth every 2 (two) hours as needed for migraine. May repeat in 2 hours if headache persists or recurs. 10 tablet 0  . varenicline (CHANTIX CONTINUING MONTH PAK) 1 MG tablet Take 1 tablet (1 mg total) by mouth 2 (two) times daily. 180 tablet 1  . Vitamin D, Ergocalciferol, (DRISDOL) 1.25 MG (50000 UNIT) CAPS capsule Take 1 capsule (50,000 Units total) by mouth every 7 (seven) days. Take for 12 total doses(weeks) than can transition to 1000 units OTC supplement daily 12 capsule 0  . metFORMIN (GLUCOPHAGE XR) 500 MG 24 hr tablet Take 2 tablets (1,000 mg total) by mouth daily with breakfast. 180 tablet 1   No current facility-administered medications for this visit.    Allergies  Allergen Reactions  . Hydrocodone Itching and Nausea And Vomiting    Also nausea and vomiting   . Penicillins Itching, Nausea And Vomiting and Other (See Comments)  . Abilify [Aripiprazole]     Very restless, extreme bilateral arm pain  . Hydrocodone-Acetaminophen Itching and Nausea And Vomiting  . Penicillin G Itching and Nausea And Vomiting      Review of Systems:  Constitutional:  No  fever, no chills, No unintentional weight  changes. No significant fatigue.   HEENT: No  headache, no vision change  Cardiac: No  chest pain  Respiratory:  +  shortness of breath. No  Cough  Gastrointestinal: No  abdominal pain, No  nausea, No  vomiting,  No  diarrhea, No  constipation   Musculoskeletal: No new myalgia/arthralgia  Skin: No  Rash, No other wounds/concerning lesions  Endocrine: No cold intolerance,  + heat intolerance. No polyuria/polydipsia/polyphagia   Neurologic: No  weakness, No  dizziness  Psychiatric: No  concerns with depression, No  concerns with anxiety, No sleep problems, No mood problems  Exam:  BP 109/72 (BP Location: Right Arm, Patient Position: Sitting)   Pulse 83   Wt 224 lb (101.6  kg)   SpO2 96%   BMI 38.45 kg/m   Constitutional: VS see above. General Appearance: alert, well-developed, well-nourished, NAD  Eyes: Normal lids and conjunctive, non-icteric sclera   Neck: No masses, trachea midline.   Respiratory: Normal respiratory effort. no wheeze, no rhonchi, no rales  Cardiovascular: S1/S2 normal, no murmur, no rub/gallop auscultated. RRR.   Gastrointestinal: Nontender, no masses.  Musculoskeletal: Gait normal. No clubbing/cyanosis of digits.   Neurological: Normal balance/coordination. No tremor. No cranial nerve deficit on limited exam.  Skin: warm, dry, intact. No rash/ulcer. No concerning nevi or subq nodules on limited exam.   Psychiatric: Normal judgment/insight. Normal mood and affect. Oriented x3.    No results found for this or any previous visit (from the past 72 hour(s)).  No results found.   ASSESSMENT/PLAN: The encounter diagnosis was Heat intolerance.   Heat sensitivity  Pt's description of symptoms sounds benign. Her medication list was reviewed and didn't note any that would commonly cause heat sensitivity. Certainly she is on some medications that could concern for potential sedation but nothing alarming for hypotension or temperature intolerance   BP  is well controlled on current regimen. Sugars were not low during episodes. Labs in June showed no anemia, no thyroid dysfunction, no significant electrolyte abnormalities.  Advised patient to be well hydrated PRIOR to being in the heat and to stay in the shade/go inside when heat becomes intolerable.  Follow up if symptoms worsen or fail to improve.  No orders of the defined types were placed in this encounter.   No orders of the defined types were placed in this encounter.   Patient Instructions  Ask to speak to referral coordinator for ADHD eval and ask if they have everything they need from [place]  Re: Aimovig, contact your insurance to check prior auth status, ask if they cover alternative Emgality or Ajovy.           Visit summary with medication list and pertinent instructions was printed for patient to review. All questions at time of visit were answered - patient instructed to contact office with any additional concerns or updates. ER/RTC precautions were reviewed with the patient.   Note: Total time spent 30 minutes, greater than 50% of the visit was spent face-to-face counseling and coordinating care for the above diagnoses listed in assessment/plan.   Please note: voice recognition software was used to produce this document, and typos may escape review. Please contact Dr. Sheppard Coil for any needed clarifications.     Follow-up plan: Return if symptoms worsen or fail to improve.  Gweneth Dimitri, [facility] MS3

## 2020-06-25 ENCOUNTER — Telehealth: Payer: Self-pay | Admitting: Osteopathic Medicine

## 2020-06-25 NOTE — Telephone Encounter (Signed)
Received fax for PA on Aimovig sent through cover my meds waiting on determination - CF °

## 2020-06-27 ENCOUNTER — Ambulatory Visit: Payer: Medicaid Other | Admitting: Sports Medicine

## 2020-06-27 NOTE — Telephone Encounter (Signed)
Received fax from [facility] they approved Aimovig valid 06/12/20 - 06/25/21. - CF

## 2020-07-01 ENCOUNTER — Other Ambulatory Visit: Payer: Self-pay | Admitting: Osteopathic Medicine

## 2020-07-03 NOTE — Telephone Encounter (Signed)
Last refill -04/06/2020 Last Ov- 06/07/2020

## 2020-07-08 ENCOUNTER — Other Ambulatory Visit: Payer: Self-pay | Admitting: Osteopathic Medicine

## 2020-07-10 ENCOUNTER — Other Ambulatory Visit: Payer: Self-pay | Admitting: Osteopathic Medicine

## 2020-07-10 ENCOUNTER — Other Ambulatory Visit: Payer: Self-pay

## 2020-07-10 ENCOUNTER — Ambulatory Visit (INDEPENDENT_AMBULATORY_CARE_PROVIDER_SITE_OTHER): Payer: Medicaid Other | Admitting: Sports Medicine

## 2020-07-10 ENCOUNTER — Ambulatory Visit (INDEPENDENT_AMBULATORY_CARE_PROVIDER_SITE_OTHER): Payer: Medicaid Other

## 2020-07-10 DIAGNOSIS — M25531 Pain in right wrist: Secondary | ICD-10-CM

## 2020-07-10 NOTE — Assessment & Plan Note (Signed)
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 At the last visit she had a palpable ganglion cyst, and her pain seemed to be referable to the third extensor compartment, today is referable more to the first extensor compartment with a positive Finkelstein sign. We performed a de Quervain's tendon sheath injection today, return to see me in a month.

## 2020-07-10 NOTE — Progress Notes (Signed)
    Procedures performed today:    Procedure: Real-time Ultrasound Guided injection of the right first extensor compartment Device: Samsung HS60  Verbal informed consent obtained.  Time-out conducted.  Noted no overlying erythema, induration, or other signs of local infection.  Skin prepped in a sterile fashion.  Local anesthesia: Topical Ethyl chloride.  With sterile technique and under real time ultrasound guidance: 1 cc Kenalog 40, 2 cc lidocaine, 2 cc bupivacaine injected easily Completed without difficulty  Pain immediately resolved suggesting accurate placement of the medication.  Advised to call if fevers/chills, erythema, induration, drainage, or persistent bleeding.  Images permanently stored and available for review in PACS.  Impression: Technically successful ultrasound guided injection.  ___________________________________________ Ihor Austin. Benjamin Stain, M.D., ABFM., CAQSM. Primary Care and Sports Medicine [place] [facility]   Adjunct Instructor of Family Medicine  [employer]  Independent interpretation of notes and tests performed by another provider:   None.  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 At the last visit she had a palpable ganglion cyst, and her pain seemed to be referable to the third extensor compartment, today is referable more to the first extensor compartment with a positive Finkelstein sign. We performed a de Quervain's tendon sheath injection today, return to see me in a month.    ___________________________________________ Ihor Austin. Benjamin Stain, M.D., ABFM., CAQSM. Primary Care and Sports Medicine [place] [facility]  Adjunct Instructor of Family Medicine  [employer]

## 2020-07-11 ENCOUNTER — Other Ambulatory Visit: Payer: Self-pay | Admitting: Osteopathic Medicine

## 2020-07-24 ENCOUNTER — Other Ambulatory Visit: Payer: Self-pay | Admitting: Osteopathic Medicine

## 2020-07-24 NOTE — Telephone Encounter (Signed)
Looks like ordered 2 weeks ago?

## 2020-07-24 NOTE — Telephone Encounter (Signed)
Routing to covering provider.  °

## 2020-07-26 ENCOUNTER — Other Ambulatory Visit: Payer: Self-pay | Admitting: Osteopathic Medicine

## 2020-07-26 ENCOUNTER — Encounter: Payer: Self-pay | Admitting: Osteopathic Medicine

## 2020-07-26 ENCOUNTER — Other Ambulatory Visit: Payer: Self-pay

## 2020-07-26 DIAGNOSIS — M545 Low back pain, unspecified: Secondary | ICD-10-CM

## 2020-07-26 DIAGNOSIS — G8929 Other chronic pain: Secondary | ICD-10-CM

## 2020-07-26 MED ORDER — PREGABALIN 75 MG PO CAPS: 75.0000 mg | ORAL_CAPSULE | Freq: Two times a day (BID) | ORAL | 0 refills | Status: DC

## 2020-07-26 NOTE — Telephone Encounter (Signed)
Routing to covering provider.  °

## 2020-07-26 NOTE — Telephone Encounter (Signed)
Pharmacy called office in behalf of the patient. They need another RX sent in. The pharmacy never received the RX last Ov- 06/07/2020 Lat refill- 01/17/2020

## 2020-07-28 ENCOUNTER — Other Ambulatory Visit: Payer: Self-pay | Admitting: Osteopathic Medicine

## 2020-07-31 ENCOUNTER — Ambulatory Visit: Payer: Medicaid Other | Admitting: Sports Medicine

## 2020-07-31 ENCOUNTER — Ambulatory Visit (INDEPENDENT_AMBULATORY_CARE_PROVIDER_SITE_OTHER): Payer: Medicaid Other | Admitting: Sports Medicine

## 2020-07-31 ENCOUNTER — Other Ambulatory Visit: Payer: Self-pay

## 2020-07-31 DIAGNOSIS — M25531 Pain in right wrist: Secondary | ICD-10-CM | POA: Diagnosis not present

## 2020-07-31 MED ORDER — TRAMADOL HCL 50 MG PO TABS: 50.0000 mg | ORAL_TABLET | Freq: Three times a day (TID) | ORAL | 0 refills | Status: DC | PRN

## 2020-07-31 NOTE — Progress Notes (Signed)
    Procedures performed today:    None.  Independent interpretation of notes and tests performed by another provider:   None.  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 Showed a positive Finkelstein sign and pain over the radial aspect of her wrist at the last visit so we did a de Quervain's injection, 50% better. Unfortunately she has persistent pain, mostly at the base of the thumb. Proceeding with MRI as she has not responded to greater than 6 weeks of conservative measures. Return to go over MRI results. Adding tramadol for pain in the meantime.    ___________________________________________ Ihor Austin. Benjamin Stain, M.D., ABFM., CAQSM. Primary Care and Sports Medicine [place] [facility]  Adjunct Instructor of Family Medicine  [employer]

## 2020-07-31 NOTE — Assessment & Plan Note (Signed)
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 Showed a positive Finkelstein sign and pain over the radial aspect of her wrist at the last visit so we did a de Quervain's injection, 50% better. Unfortunately she has persistent pain, mostly at the base of the thumb. Proceeding with MRI as she has not responded to greater than 6 weeks of conservative measures. Return to go over MRI results. Adding tramadol for pain in the meantime.

## 2020-08-07 ENCOUNTER — Ambulatory Visit: Payer: Medicaid Other | Admitting: Sports Medicine

## 2020-08-18 ENCOUNTER — Other Ambulatory Visit: Payer: Self-pay

## 2020-08-18 ENCOUNTER — Ambulatory Visit (INDEPENDENT_AMBULATORY_CARE_PROVIDER_SITE_OTHER): Payer: Medicaid Other

## 2020-08-18 DIAGNOSIS — M25531 Pain in right wrist: Secondary | ICD-10-CM

## 2020-08-21 ENCOUNTER — Other Ambulatory Visit: Payer: Self-pay

## 2020-08-21 ENCOUNTER — Ambulatory Visit (INDEPENDENT_AMBULATORY_CARE_PROVIDER_SITE_OTHER): Payer: Medicaid Other

## 2020-08-21 ENCOUNTER — Ambulatory Visit (INDEPENDENT_AMBULATORY_CARE_PROVIDER_SITE_OTHER): Payer: Medicaid Other | Admitting: Sports Medicine

## 2020-08-21 ENCOUNTER — Other Ambulatory Visit: Payer: Self-pay | Admitting: Osteopathic Medicine

## 2020-08-21 DIAGNOSIS — M25531 Pain in right wrist: Secondary | ICD-10-CM

## 2020-08-21 NOTE — Progress Notes (Signed)
    Procedures performed today:    Procedure: Real-time Ultrasound Guided injection of the dorsal midcarpal ganglion, right wrist Device: Samsung HS60  Verbal informed consent obtained.  Time-out conducted.  Noted no overlying erythema, induration, or other signs of local infection.  Skin prepped in a sterile fashion.  Local anesthesia: Topical Ethyl chloride.  With sterile technique and under real time ultrasound guidance:  Noted ganglion, 1 cc kenalog 40, 1 cc lidocaine injected easily.   Completed without difficulty  Pain immediately resolved suggesting accurate placement of the medication.  Advised to call if fevers/chills, erythema, induration, drainage, or persistent bleeding.  Images permanently stored and available for review in PACS.  Impression: Technically successful ultrasound guided injection.  Independent interpretation of notes and tests performed by another provider:   MRI personally reviewed, there is a ganglion, moderate in size overlying the dorsal midcarpal joint.  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 Ultimately we obtained an MRI, personally reviewed the MRI, there is a ganglion cyst overlying the dorsal midcarpal joint. This was injected today, return to see me in 1 month.    ___________________________________________ Ihor Austin. Benjamin Stain, M.D., ABFM., CAQSM. Primary Care and Sports Medicine [place] [facility]  Adjunct Instructor of Family Medicine  [employer]

## 2020-08-21 NOTE — Assessment & Plan Note (Signed)
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 Ultimately we obtained an MRI, personally reviewed the MRI, there is a ganglion cyst overlying the dorsal midcarpal joint. This was injected today, return to see me in 1 month.

## 2020-09-05 ENCOUNTER — Ambulatory Visit (INDEPENDENT_AMBULATORY_CARE_PROVIDER_SITE_OTHER): Payer: Medicaid Other | Admitting: Osteopathic Medicine

## 2020-09-05 ENCOUNTER — Encounter: Payer: Self-pay | Admitting: Osteopathic Medicine

## 2020-09-05 VITALS — BP 119/77 | HR 85 | Ht 64.0 in | Wt 219.0 lb

## 2020-09-05 DIAGNOSIS — F489 Nonpsychotic mental disorder, unspecified: Secondary | ICD-10-CM

## 2020-09-05 DIAGNOSIS — K220 Achalasia of cardia: Secondary | ICD-10-CM | POA: Diagnosis not present

## 2020-09-05 MED ORDER — IPRATROPIUM BROMIDE 0.06 % NA SOLN: 2.0000 | Freq: Four times a day (QID) | NASAL | 1 refills | Status: DC

## 2020-09-05 MED ORDER — BUPROPION HCL ER (XL) 150 MG PO TB24: ORAL_TABLET | ORAL | 0 refills | Status: DC

## 2020-09-05 NOTE — Patient Instructions (Addendum)
Plan:  Will get records from ADD/psychological evaluation. Standard practice is to await formal testing prior to prescribing medications, especially in people with other history of anxiety, depression, mood disorder, etc. In the meantime, can try Wellbutrin 150 mg for 1-2 weeks then increase --> 300 mg. Please ask your mental health care professionals to send me all records - remind them at each appointment! Records can be sent by fax to [phone].   For throat - add Sudafed for 3-5 days, I also sent Rx for nasal spray for 5-7 days use.

## 2020-09-06 NOTE — Progress Notes (Signed)
Rachael Fowler is a 34 y.o. female who presents to  [facility]  today, 09/05/20, seeking care for the following:  . Concern for ADHD - reportd recently seen by therapist - initial consultation done, formal ADHD evaluation scheduled for few months from now. I don't have records at this point. Pt reports she was told to f/u w/ PCP re: Rx.  . Throat pain - feels like throat spasm/soreness.      ASSESSMENT & PLAN with other pertinent findings:  The primary encounter diagnosis was Mental health problem. A diagnosis of Spasm of throat muscle was also pertinent to this visit.   No results found for this or any previous visit (from the past 24 hour(s)).  --> trial non-stimulant medication, await records from psychology --> normal HEENT exam, possible postnasal drip    Patient Instructions  Plan:  Will get records from ADD/psychological evaluation. Standard practice is to await formal testing prior to prescribing medications, especially in people with other history of anxiety, depression, mood disorder, etc. In the meantime, can try Wellbutrin 150 mg for 1-2 weeks then increase --> 300 mg. Please ask your mental health care professionals to send me all records - remind them at each appointment! Records can be sent by fax to [phone].   For throat - add Sudafed for 3-5 days, I also sent Rx for nasal spray for 5-7 days use.       No orders of the defined types were placed in this encounter.   Meds ordered this encounter  Medications  . ipratropium (ATROVENT) 0.06 % nasal spray    Sig: Place 2 sprays into both nostrils 4 (four) times daily. As needed for runny nose / postnasal drip    Dispense:  15 mL    Refill:  1  . buPROPion (WELLBUTRIN XL) 150 MG 24 hr tablet    Sig: Take 1 tablet (150 mg total) by mouth every morning for 14 days, THEN 2 tablets (300 mg total) every morning.    Dispense:  90 tablet    Refill:  0        Follow-up instructions: Return in about 4 weeks (around 10/03/2020) for RECHECK ON NEW MEDICAION [facility]) AND REVIEW RECORDS FROM PSYCHOLOGIST .                                         BP 119/77   Pulse 85   Ht 5\' 4"  (1.626 m)   Wt 219 lb (99.3 kg)   BMI 37.59 kg/m   No outpatient medications have been marked as taking for the 09/05/20 encounter (Office Visit) with 13/10/21, DO.    No results found for this or any previous visit (from the past 72 hour(s)).  No results found.     All questions at time of visit were answered - patient instructed to contact office with any additional concerns or updates.  ER/RTC precautions were reviewed with the patient as applicable.   Please note: voice recognition software was used to produce this document, and typos may escape review. Please contact Dr. Sunnie Nielsen for any needed clarifications.

## 2020-09-18 ENCOUNTER — Other Ambulatory Visit: Payer: Self-pay | Admitting: Osteopathic Medicine

## 2020-09-18 DIAGNOSIS — F419 Anxiety disorder, unspecified: Secondary | ICD-10-CM

## 2020-09-19 ENCOUNTER — Ambulatory Visit: Payer: Medicaid Other | Admitting: Sports Medicine

## 2020-10-02 ENCOUNTER — Telehealth: Payer: Self-pay | Admitting: Osteopathic Medicine

## 2020-10-02 NOTE — Telephone Encounter (Signed)
Left a detailed vm msg for pt regarding provider's note/recommendation. Aware that no new controlled rxs for ADHD will be given until records are received from Psychologist office. Pt informed to return a call back to the clinic to reschedule her current appointment. Direct call back info provided.

## 2020-10-02 NOTE — Telephone Encounter (Signed)
Patient is on schedule for Thursday for mental health follow-up. She needs to be rescheduled because I have a meeting at [facility] that time.   As we are planning rescheduling, we need to address the following:   I never did get records regarding ADHD testing or whether she has this diagnosis. NO controlled substances for ADHD will be discussed until/unless I get these records and test results. Patient needs to contact her psychologist and they need to send [place] records.   If she feels the Wellbutrin we started 09/05/20 is working ok, I can refill it and we can follow-up in a few weeks to allow time for the records to get to [place]

## 2020-10-03 NOTE — Telephone Encounter (Signed)
FYI to [facility] re: schedule for tomorrow

## 2020-10-04 ENCOUNTER — Ambulatory Visit: Payer: Medicaid Other | Admitting: Osteopathic Medicine

## 2020-10-04 NOTE — Telephone Encounter (Signed)
Called multiple times to reschedule. I will be trying again later today.

## 2020-10-10 ENCOUNTER — Encounter: Payer: Self-pay | Admitting: Family Medicine

## 2020-10-10 ENCOUNTER — Telehealth (INDEPENDENT_AMBULATORY_CARE_PROVIDER_SITE_OTHER): Payer: Medicaid Other | Admitting: Family Medicine

## 2020-10-10 DIAGNOSIS — J4541 Moderate persistent asthma with (acute) exacerbation: Secondary | ICD-10-CM | POA: Diagnosis not present

## 2020-10-10 DIAGNOSIS — J45901 Unspecified asthma with (acute) exacerbation: Secondary | ICD-10-CM | POA: Insufficient documentation

## 2020-10-10 MED ORDER — ALBUTEROL SULFATE HFA 108 (90 BASE) MCG/ACT IN AERS: 2.0000 | INHALATION_SPRAY | Freq: Four times a day (QID) | RESPIRATORY_TRACT | 0 refills | Status: DC | PRN

## 2020-10-10 MED ORDER — PREDNISONE 20 MG PO TABS: 20.0000 mg | ORAL_TABLET | Freq: Two times a day (BID) | ORAL | 0 refills | Status: AC

## 2020-10-10 NOTE — Progress Notes (Signed)
Rachael Fowler - 34 y.o. female MRN [id number]  Date of birth: 10-16-1986   This visit type was conducted due to national recommendations for restrictions regarding the COVID-19 Pandemic (e.g. social distancing).  This format is felt to be most appropriate for this patient at this time.  All issues noted in this document were discussed and addressed.  No physical exam was performed (except for noted visual exam findings with Video Visits).  I discussed the limitations of evaluation and management by telemedicine and the availability of in person appointments. The patient expressed understanding and agreed to proceed.  I connected with@ on 10/10/20 at  1:40 PM EST by a video enabled telemedicine application and verified that I am speaking with the correct person using two identifiers.  Present at visit: Luetta Nutting, DO [name]   Patient Location: [place] [address]   Provider location:   Home  No chief complaint on file.   HPI  [name] is a 34 y.o. female who presents via audio/video conferencing for a telehealth visit today.  She has complaint of chest tightness, mild wheezing, cough, and nasal congestion.  Symptoms started about 3 days ago.  She has history of asthma.  Feels like previous asthma exacerbations/bronchitis.  She does not have an inhaler at home.  She denies fever, chills, body aches, nausea or vomiting.  She has not been vaccinated for COVID.     ROS:  A comprehensive ROS was completed and negative except as noted per HPI  Past Medical History:  Diagnosis Date  . Asthma   . Diabetes mellitus without complication ([place])   . Hypertension   . Mental disorder     Past Surgical History:  Procedure Laterality Date  . CESAREAN SECTION    . KNEE SURGERY  04/20/2006    Family History  Problem Relation Age of Onset  . Depression Mother   . Diabetes Mother   . Hyperlipidemia Mother   . Hypertension Mother   . Hyperlipidemia Father   .  Hypertension Father   . Cancer Maternal Uncle   . Diabetes Maternal Uncle   . Cancer Maternal Grandmother   . Hypertension Maternal Grandmother   . Hyperlipidemia Maternal Grandmother   . Hyperlipidemia Paternal Grandmother     Social History   Socioeconomic History  . Marital status: Single    Spouse name: Not on file  . Number of children: Not on file  . Years of education: Not on file  . Highest education level: Not on file  Occupational History  . Not on file  Tobacco Use  . Smoking status: Former Smoker    Packs/day: 0.50    Types: Cigarettes    Quit date: 01/22/2020    Years since quitting: 0.7  . Smokeless tobacco: Never Used  Vaping Use  . Vaping Use: Never used  Substance and Sexual Activity  . Alcohol use: Not Currently    Comment: rarely  . Drug use: Never  . Sexual activity: Yes    Birth control/protection: I.U.D.  Other Topics Concern  . Not on file  Social History Narrative  . Not on file   Social Determinants of Health   Financial Resource Strain: Not on file  Food Insecurity: Not on file  Transportation Needs: Not on file  Physical Activity: Not on file  Stress: Not on file  Social Connections: Not on file  Intimate Partner Violence: Not on file     Current Outpatient Medications:  .  AIMOVIG 70 MG/ML SOAJ, INJECT 70 MG INTO THE SKIN EVERY 30 (THIRTY) DAYS., Disp: 1 mL, Rfl: 5 .  Blood Glucose Monitoring Suppl (ACCU-CHEK AVIVA PLUS) w/Device KIT, Accu-Chek Aviva Plus Meter  USE AS DIRECTED, Disp: , Rfl:  .  Continuous Blood Gluc Receiver (DEXCOM G6 RECEIVER) DEVI, DISPENSE AND USE AS DIRECTED, Disp: , Rfl:  .  diclofenac sodium (VOLTAREN) 1 % GEL, Apply 4 g topically 4 (four) times daily. To affected joint., Disp: 100 g, Rfl: 11 .  Dulaglutide (TRULICITY) 0.16 PV/3.7SM SOPN, Inject 0.75 mg into the skin once a week., Disp: 4 pen, Rfl: 11 .  escitalopram (LEXAPRO) 10 MG tablet, TAKE 1 TABLET (10 MG TOTAL) BY MOUTH AT BEDTIME. TAKE W/ 5 MG TABLET  FOR TOTAL 15 MG DAILY, Disp: 90 tablet, Rfl: 1 .  escitalopram (LEXAPRO) 5 MG tablet, TAKE 1 TABLET (5 MG TOTAL) BY MOUTH DAILY. TAKE WITH 10 MG TABLETS FOR TOTAL OF 15 MG DAILY, Disp: 90 tablet, Rfl: 3 .  insulin detemir (LEVEMIR) 100 UNIT/ML injection, Inject 70 Units into the skin. , Disp: , Rfl:  .  Insulin Pen Needle (BD PEN NEEDLE NANO U/F) 32G X 4 MM MISC, Use with levemir daily, Disp: , Rfl:  .  Insulin Syringe-Needle U-100 31G X 15/64" 0.3 ML MISC, by Does not apply route., Disp: , Rfl:  .  ipratropium (ATROVENT) 0.06 % nasal spray, Place 2 sprays into both nostrils 4 (four) times daily. As needed for runny nose / postnasal drip, Disp: 15 mL, Rfl: 1 .  levonorgestrel (MIRENA) 20 MCG/24HR IUD, 1 each by Intrauterine route once., Disp: , Rfl:  .  lidocaine (XYLOCAINE) 2 % solution, Use as directed 5-10 mLs in the mouth or throat every 3 (three) hours as needed., Disp: 100 mL, Rfl: 1 .  lisinopril (ZESTRIL) 10 MG tablet, Take 1 tablet by mouth daily., Disp: , Rfl:  .  loratadine (CLARITIN) 10 MG tablet, Take 1 tablet (10 mg total) by mouth daily., Disp: 90 tablet, Rfl: 3 .  meloxicam (MOBIC) 15 MG tablet, One tab PO qAM with a meal for 2 weeks, then daily prn pain., Disp: 30 tablet, Rfl: 3 .  omeprazole (PRILOSEC) 10 MG capsule, TAKE 1 CAPSULE BY MOUTH EVERY DAY, Disp: 90 capsule, Rfl: 3 .  pregabalin (LYRICA) 75 MG capsule, Take 1 capsule (75 mg total) by mouth 2 (two) times daily., Disp: 180 capsule, Rfl: 0 .  QUEtiapine (SEROQUEL) 100 MG tablet, Take 1 tablet (100 mg total) by mouth at bedtime., Disp: 90 tablet, Rfl: 3 .  SUMAtriptan (IMITREX) 50 MG tablet, Take 0.5-1 tablets (25-50 mg total) by mouth every 2 (two) hours as needed for migraine. May repeat in 2 hours if headache persists or recurs., Disp: 10 tablet, Rfl: 0 .  traMADol (ULTRAM) 50 MG tablet, Take 1-2 tablets (50-100 mg total) by mouth every 8 (eight) hours as needed for moderate pain. Maximum 6 tabs per day., Disp: 21 tablet,  Rfl: 0 .  varenicline (CHANTIX CONTINUING MONTH PAK) 1 MG tablet, Take 1 tablet (1 mg total) by mouth 2 (two) times daily., Disp: 180 tablet, Rfl: 1 .  Vitamin D, Ergocalciferol, (DRISDOL) 1.25 MG (50000 UNIT) CAPS capsule, Take 1 capsule (50,000 Units total) by mouth every 7 (seven) days. Take for 12 total doses(weeks) than can transition to 1000 units OTC supplement daily, Disp: 12 capsule, Rfl: 0 .  ACCU-CHEK GUIDE test strip, , Disp: , Rfl:  .  albuterol (VENTOLIN HFA) 108 (90 Base)  MCG/ACT inhaler, Inhale 2 puffs into the lungs every 6 (six) hours as needed for wheezing or shortness of breath., Disp: 8 g, Rfl: 0 .  Continuous Blood Gluc Sensor (DEXCOM G6 SENSOR) MISC, Apply topically., Disp: , Rfl:  .  Continuous Blood Gluc Transmit (DEXCOM G6 TRANSMITTER) MISC, , Disp: , Rfl:  .  metFORMIN (GLUCOPHAGE XR) 500 MG 24 hr tablet, Take 2 tablets (1,000 mg total) by mouth daily with breakfast., Disp: 180 tablet, Rfl: 1 .  predniSONE (DELTASONE) 20 MG tablet, Take 1 tablet (20 mg total) by mouth 2 (two) times daily with a meal for 5 days., Disp: 10 tablet, Rfl: 0  EXAM:  VITALS per patient if applicable: Wt 219 lb (99.3 kg)   BMI 37.59 kg/m   GENERAL: alert, oriented, appears well and in no acute distress  HEENT: atraumatic, conjunttiva clear, no obvious abnormalities on inspection of external nose and ears  NECK: normal movements of the head and neck  LUNGS: on inspection no signs of respiratory distress, breathing rate appears normal, no obvious gross SOB, gasping or wheezing  CV: no obvious cyanosis  MS: moves all visible extremities without noticeable abnormality  PSYCH/NEURO: pleasant and cooperative, no obvious depression or anxiety, speech and thought processing grossly intact  ASSESSMENT AND PLAN:  Discussed the following assessment and plan:  Asthma exacerbation Albuterol renewed. Use every 4-6 hours as needed.  Add 5 day prednisone burst.  Recommend having COVID test if  symptoms worsen, develop fever.  She will contact the clinic if symptoms are not improving.       I discussed the assessment and treatment plan with the patient. The patient was provided an opportunity to ask questions and all were answered. The patient agreed with the plan and demonstrated an understanding of the instructions.   The patient was advised to call back or seek an in-person evaluation if the symptoms worsen or if the condition fails to improve as anticipated.    Luetta Nutting, DO

## 2020-10-10 NOTE — Progress Notes (Signed)
Symptoms started: 12/12  Chest tightness Nasal congestion Cough  No difficulty breathing.  No COVID vaccine

## 2020-10-10 NOTE — Assessment & Plan Note (Signed)
Albuterol renewed. Use every 4-6 hours as needed.  Add 5 day prednisone burst.  Recommend having COVID test if symptoms worsen, develop fever.  She will contact the clinic if symptoms are not improving.

## 2020-10-11 ENCOUNTER — Encounter: Payer: Self-pay | Admitting: Osteopathic Medicine

## 2020-11-16 ENCOUNTER — Ambulatory Visit (INDEPENDENT_AMBULATORY_CARE_PROVIDER_SITE_OTHER): Payer: Medicaid Other

## 2020-11-16 ENCOUNTER — Other Ambulatory Visit: Payer: Self-pay

## 2020-11-16 ENCOUNTER — Ambulatory Visit (INDEPENDENT_AMBULATORY_CARE_PROVIDER_SITE_OTHER): Payer: Medicaid Other | Admitting: Sports Medicine

## 2020-11-16 DIAGNOSIS — M65831 Other synovitis and tenosynovitis, right forearm: Secondary | ICD-10-CM

## 2020-11-16 NOTE — Assessment & Plan Note (Signed)
This is a pleasant 35 year old female with classic extensor intersection syndrome/squeaker syndrome, she does have palpable crepitus, and audible squeaking/crepitus. This occurred after a session of raking leaves. Today we performed an injection at the intersection of the second and third extensor compartments. Return to see me in a month.

## 2020-11-16 NOTE — Progress Notes (Signed)
    Procedures performed today:    Procedure: Real-time Ultrasound Guided injection of the just superficial to the extensor carpi radialis longus and brevis Device: Samsung [id number]  Verbal informed consent obtained.  Time-out conducted.  Noted no overlying erythema, induration, or other signs of local infection.  Skin prepped in a sterile fashion.  Local anesthesia: Topical Ethyl chloride.  With sterile technique and under real time ultrasound guidance: 1 cc Kenalog 40, 1 cc lidocaine, 1 cc bupivacaine injected easily Completed without difficulty  Advised to call if fevers/chills, erythema, induration, drainage, or persistent bleeding.  Images permanently stored and available for review in PACS.  Impression: Technically successful ultrasound guided injection.  Independent interpretation of notes and tests performed by another provider:   None.  Brief History, Exam, Impression, and Recommendations:    Extensor intersection syndrome of right wrist This is a pleasant 35 year old female with classic extensor intersection syndrome/squeaker syndrome, she does have palpable crepitus, and audible squeaking/crepitus. This occurred after a session of raking leaves. Today we performed an injection at the intersection of the second and third extensor compartments. Return to see me in a month.    ___________________________________________ Ihor Austin. Benjamin Stain, M.D., ABFM., CAQSM. Primary Care and Sports Medicine [facility]  Adjunct Instructor of Family Medicine  [employer]

## 2020-11-27 ENCOUNTER — Telehealth (INDEPENDENT_AMBULATORY_CARE_PROVIDER_SITE_OTHER): Payer: Medicaid Other | Admitting: Osteopathic Medicine

## 2020-11-27 ENCOUNTER — Encounter: Payer: Self-pay | Admitting: Osteopathic Medicine

## 2020-11-27 VITALS — Wt 222.0 lb

## 2020-11-27 DIAGNOSIS — U071 COVID-19: Secondary | ICD-10-CM | POA: Diagnosis not present

## 2020-11-27 MED ORDER — ONDANSETRON 8 MG PO TBDP: 8.0000 mg | ORAL_TABLET | Freq: Three times a day (TID) | ORAL | 1 refills | Status: DC | PRN

## 2020-11-27 MED ORDER — PROMETHAZINE-DM 6.25-15 MG/5ML PO SYRP: 2.5000 mL | ORAL_SOLUTION | Freq: Four times a day (QID) | ORAL | 0 refills | Status: DC | PRN

## 2020-11-27 NOTE — Progress Notes (Unsigned)
Telemedicine Visit via  Video & Audio (App used: [email or web address])   I connected with [name] on 11/28/20 at 2:36 PM  by phone or  telemedicine application as noted above  I verified that I am speaking with or regarding  the correct patient using two identifiers.  Participants: Myself, Dr Emeterio Reeve DO Patient: Rachael Fowler Patient proxy if applicable: none Other, if applicable: none  Patient is at home I am in office at [facility]    I discussed the limitations of evaluation and management  by telemedicine and the availability of in person appointments.  The participant(s) above expressed understanding and  agreed to proceed with this appointment via telemedicine.       History of Present Illness: Rachael Fowler is a 35 y.o. female who would like to discuss COVID   Reports headache and nausea, dizziness. Has had to stop insulin d/t low appetite. Breathing is ok but some dyspnea on exertion. Some coughing but not too bad. Reports fever/chills, no known temperature.      Observations/Objective: Wt 222 lb (100.7 kg)   BMI 38.11 kg/m  BP Readings from Last 3 Encounters:  09/05/20 119/77  06/07/20 109/72  04/12/20 119/87   Exam: Normal Speech.  NAD  Lab and Radiology Results No results found for this or any previous visit (from the past 72 hour(s)). No results found.     Assessment and Plan: 35 y.o. female with The encounter diagnosis was COVID.   PDMP not reviewed this encounter. No orders of the defined types were placed in this encounter.  Meds ordered this encounter  Medications  . ondansetron (ZOFRAN-ODT) 8 MG disintegrating tablet    Sig: Take 1 tablet (8 mg total) by mouth every 8 (eight) hours as needed for nausea.    Dispense:  30 tablet    Refill:  1  . promethazine-dextromethorphan (PROMETHAZINE-DM) 6.25-15 MG/5ML syrup    Sig: Take 2.5-5 mLs by mouth 4 (four) times daily as needed for cough.    Dispense:  180 mL    Refill:  0    Patient Instructions  Fax here is [phone] - please ask the ADHD testing folks to send me report asap!     COVID:   [place] information on COVID:  [email or web address] Schedule vaccine What do to if you think you have COVID: e-visits may be more readily available than clinic virtual visits  Information on home care  Questions about antibody infusion clinic and post-COVID care clinic   For general questions regarding COVID infection/exposure and care: [facility] patient information [email or web address] At-home treatment Reasons to go to a hospital  Protecting yourself while caring for someone w/ COVID  Isolation/Quarantine if you have COVID or if you have been exposed to [place]   For questions about types of testing for COVID: [facility] patient information [email or web address]   If patients still have questions or concerning symptoms, they should call us [phone] to schedule a virtual visit, or seek in-person care in an urgent care or emergency department or call 911 if needed        Medications & Home Remedies for Viral Respiratory Illness   Note: the following list assumes no pregnancy, normal liver & kidney function and no other drug interactions. Dr. Sheppard Coil has highlighted medications which are safe for you to use, but these may not be appropriate for everyone. Always ask a pharmacist or qualified medical provider if you have any questions!    Aches/Pains, Fever, Headache OTC Acetaminophen (Tylenol) 500  mg tablets - take max 2 tablets (1000 mg) every 6 hours (4 times per day)  OTC Ibuprofen (Motrin) 200 mg tablets - take max 4 tablets (800 mg) every 6 hours*   Nausea Rx Zofran   Sinus Congestion OTC Nasal Saline if desired to rinse OTC Oxymetolazone (Afrin, others)  sparing use due to rebound congestion, NEVER use in kids OTC Phenylephrine (Sudafed) 10 mg tablets every 4 hours (or the 12-hour formulation)* OTC Diphenhydramine (Benadryl) 25 mg tablets - take max 2 tablets every 4 hours   Cough & Sore Throat Prescription cough pills or syrups as directed OTC Dextromethorphan (Robitussin, others) - cough suppressant OTC Guaifenesin (Robitussin, Mucinex, others) - expectorant (helps cough up mucus) (Dextromethorphan and Guaifenesin also come in a combination tablet/syrup) OTC Lozenges w/ Benzocaine + Menthol (Cepacol) Honey - as much as you want! Teas which "coat the throat" - look for ingredients Elm Bark, Licorice Root, Marshmallow Root   Other Prescription Oral Steroids to decrease inflammation and improve energy Prescription Antibiotics if these are necessary for bacterial infection - take ALL, even if you're feeling better  OTC Zinc Lozenges within 24 hours of symptoms onset - mixed evidence this shortens the duration of the common cold Don't waste your money on Vitamin C or Echinacea in acute illness - it's already too late!    *Caution in patients with high blood pressure       Instructions sent via MyChart.   Follow Up Instructions: Return if symptoms worsen or fail to improve.    I discussed the assessment and treatment plan with the patient. The patient was provided an opportunity to ask questions and all were answered. The patient agreed with the plan and demonstrated an understanding of the instructions.   The patient was advised to call back or seek an in-person evaluation if any new concerns, if symptoms worsen or if the condition fails to improve as anticipated.  30 minutes of non-face-to-face time was provided during this encounter.      . . . . . . . . . . . . . [name]                   Historical information moved to improve visibility of documentation.  Past Medical History:  Diagnosis Date  .  Asthma   . Diabetes mellitus without complication ([place])   . Hypertension   . Mental disorder    Past Surgical History:  Procedure Laterality Date  . CESAREAN SECTION    . KNEE SURGERY  04/20/2006   Social History   Tobacco Use  . Smoking status: Former Smoker    Packs/day: 0.50    Types: Cigarettes    Quit date: 01/22/2020    Years since quitting: 0.8  . Smokeless tobacco: Never Used  Substance Use Topics  . Alcohol use: Not Currently    Comment: rarely   family history includes Cancer in her maternal grandmother and maternal uncle; Depression in her mother; Diabetes in her maternal uncle and mother; Hyperlipidemia in her father, maternal grandmother, mother, and paternal grandmother; Hypertension in her father, maternal grandmother, and mother.  Medications: Current Outpatient Medications  Medication Sig Dispense Refill  . ACCU-CHEK GUIDE test strip     . AIMOVIG 70 MG/ML SOAJ INJECT 70 MG INTO THE SKIN EVERY 30 (THIRTY) DAYS. 1 mL 5  . albuterol (VENTOLIN HFA) 108 (90 Base) MCG/ACT inhaler Inhale 2 puffs into the lungs every 6 (six) hours as needed for wheezing or shortness of  breath. 8 g 0  . Blood Glucose Monitoring Suppl (ACCU-CHEK AVIVA PLUS) w/Device KIT Accu-Chek Aviva Plus Meter  USE AS DIRECTED    . Continuous Blood Gluc Receiver (DEXCOM G6 RECEIVER) DEVI DISPENSE AND USE AS DIRECTED    . Continuous Blood Gluc Sensor (DEXCOM G6 SENSOR) MISC Apply topically.    . Continuous Blood Gluc Transmit (DEXCOM G6 TRANSMITTER) MISC     . diclofenac sodium (VOLTAREN) 1 % GEL Apply 4 g topically 4 (four) times daily. To affected joint. 100 g 11  . Dulaglutide (TRULICITY) 0.99 IP/3.8SN SOPN Inject 0.75 mg into the skin once a week. 4 pen 11  . escitalopram (LEXAPRO) 10 MG tablet TAKE 1 TABLET (10 MG TOTAL) BY MOUTH AT BEDTIME. TAKE W/ 5 MG TABLET FOR TOTAL 15 MG DAILY 90 tablet 1  . escitalopram (LEXAPRO) 5 MG tablet TAKE 1 TABLET (5 MG TOTAL) BY MOUTH DAILY. TAKE WITH 10 MG TABLETS  FOR TOTAL OF 15 MG DAILY 90 tablet 3  . insulin detemir (LEVEMIR) 100 UNIT/ML injection Inject 70 Units into the skin.     . Insulin Pen Needle (BD PEN NEEDLE NANO U/F) 32G X 4 MM MISC Use with levemir daily    . Insulin Syringe-Needle U-100 31G X 15/64" 0.3 ML MISC by Does not apply route.    [name] ipratropium (ATROVENT) 0.06 % nasal spray Place 2 sprays into both nostrils 4 (four) times daily. As needed for runny nose / postnasal drip 15 mL 1  . levonorgestrel (MIRENA) 20 MCG/24HR IUD 1 each by Intrauterine route once.    . lidocaine (XYLOCAINE) 2 % solution Use as directed 5-10 mLs in the mouth or throat every 3 (three) hours as needed. 100 mL 1  . lisinopril (ZESTRIL) 10 MG tablet Take 1 tablet by mouth daily.    [name] loratadine (CLARITIN) 10 MG tablet Take 1 tablet (10 mg total) by mouth daily. 90 tablet 3  . meloxicam (MOBIC) 15 MG tablet One tab PO qAM with a meal for 2 weeks, then daily prn pain. 30 tablet 3  . omeprazole (PRILOSEC) 10 MG capsule TAKE 1 CAPSULE BY MOUTH EVERY DAY 90 capsule 3  . ondansetron (ZOFRAN-ODT) 8 MG disintegrating tablet Take 1 tablet (8 mg total) by mouth every 8 (eight) hours as needed for nausea. 30 tablet 1  . pregabalin (LYRICA) 75 MG capsule Take 1 capsule (75 mg total) by mouth 2 (two) times daily. 180 capsule 0  . promethazine-dextromethorphan (PROMETHAZINE-DM) 6.25-15 MG/5ML syrup Take 2.5-5 mLs by mouth 4 (four) times daily as needed for cough. 180 mL 0  . QUEtiapine (SEROQUEL) 100 MG tablet Take 1 tablet (100 mg total) by mouth at bedtime. 90 tablet 3  . SUMAtriptan (IMITREX) 50 MG tablet Take 0.5-1 tablets (25-50 mg total) by mouth every 2 (two) hours as needed for migraine. May repeat in 2 hours if headache persists or recurs. 10 tablet 0  . traMADol (ULTRAM) 50 MG tablet Take 1-2 tablets (50-100 mg total) by mouth every 8 (eight) hours as needed for moderate pain. Maximum 6 tabs per day. 21 tablet 0  . metFORMIN (GLUCOPHAGE XR) 500 MG 24 hr tablet Take 2  tablets (1,000 mg total) by mouth daily with breakfast. 180 tablet 1   No current facility-administered medications for this visit.   Allergies  Allergen Reactions  . Hydrocodone Itching and Nausea And Vomiting    Also nausea and vomiting   . Penicillins Itching, Nausea And Vomiting and Other (See Comments)  .  Abilify [Aripiprazole]     Very restless, extreme bilateral arm pain  . Hydrocodone-Acetaminophen Itching and Nausea And Vomiting  . Penicillin G Itching and Nausea And Vomiting     If phone visit, billing and coding can please add appropriate modifier if needed

## 2020-11-27 NOTE — Patient Instructions (Addendum)
Fax here is [phone] - please ask the ADHD testing folks to send me report asap!     COVID:   [place] information on COVID:  [email or web address] Schedule vaccine What do to if you think you have COVID: e-visits may be more readily available than clinic virtual visits  Information on home care  Questions about antibody infusion clinic and post-COVID care clinic   For general questions regarding COVID infection/exposure and care: [facility] patient information [email or web address] At-home treatment Reasons to go to a hospital  Protecting yourself while caring for someone w/ COVID  Isolation/Quarantine if you have COVID or if you have been exposed to COVID   For questions about types of testing for COVID: [facility] patient information [email or web address]   If patients still have questions or concerning symptoms, they should call us [phone] to schedule a virtual visit, or seek in-person care in an urgent care or emergency department or call 911 if needed        Medications & Home Remedies for Viral Respiratory Illness   Note: the following list assumes no pregnancy, normal liver & kidney function and no other drug interactions. Dr. Lyn Hollingshead has highlighted medications which are safe for you to use, but these may not be appropriate for everyone. Always ask a pharmacist or qualified medical provider if you have any questions!    Aches/Pains, Fever, Headache OTC Acetaminophen (Tylenol) 500 mg tablets - take max 2 tablets (1000 mg) every 6 hours (4 times per day)  OTC Ibuprofen (Motrin) 200 mg tablets - take max 4 tablets (800 mg) every 6 hours*   Nausea Rx Zofran   Sinus Congestion OTC Nasal Saline if desired to rinse OTC Oxymetolazone (Afrin, others) sparing use due to rebound  congestion, NEVER use in kids OTC Phenylephrine (Sudafed) 10 mg tablets every 4 hours (or the 12-hour formulation)* OTC Diphenhydramine (Benadryl) 25 mg tablets - take max 2 tablets every 4 hours   Cough & Sore Throat Prescription cough pills or syrups as directed OTC Dextromethorphan (Robitussin, others) - cough suppressant OTC Guaifenesin (Robitussin, Mucinex, others) - expectorant (helps cough up mucus) (Dextromethorphan and Guaifenesin also come in a combination tablet/syrup) OTC Lozenges w/ Benzocaine + Menthol (Cepacol) Honey - as much as you want! Teas which "coat the throat" - look for ingredients Elm Bark, Licorice Root, Marshmallow Root   Other Prescription Oral Steroids to decrease inflammation and improve energy Prescription Antibiotics if these are necessary for bacterial infection - take ALL, even if you're feeling better  OTC Zinc Lozenges within 24 hours of symptoms onset - mixed evidence this shortens the duration of the common cold Don't waste your money on Vitamin C or Echinacea in acute illness - it's already too late!    *Caution in patients with high blood pressure

## 2020-12-03 ENCOUNTER — Telehealth: Payer: Medicaid Other | Admitting: Osteopathic Medicine

## 2020-12-23 ENCOUNTER — Other Ambulatory Visit: Payer: Self-pay | Admitting: Osteopathic Medicine

## 2021-01-12 ENCOUNTER — Other Ambulatory Visit: Payer: Self-pay | Admitting: Family Medicine

## 2021-01-12 DIAGNOSIS — M545 Low back pain, unspecified: Secondary | ICD-10-CM

## 2021-01-12 DIAGNOSIS — G8929 Other chronic pain: Secondary | ICD-10-CM

## 2021-01-14 NOTE — Telephone Encounter (Signed)
Forwarding to PCP.

## 2021-02-14 ENCOUNTER — Encounter: Payer: Self-pay | Admitting: Obstetrics and Gynecology

## 2021-02-14 ENCOUNTER — Ambulatory Visit (INDEPENDENT_AMBULATORY_CARE_PROVIDER_SITE_OTHER): Payer: Medicaid Other | Admitting: Obstetrics and Gynecology

## 2021-02-14 ENCOUNTER — Other Ambulatory Visit: Payer: Self-pay

## 2021-02-14 VITALS — BP 116/82 | HR 79 | Resp 16 | Ht 64.0 in | Wt 221.0 lb

## 2021-02-14 DIAGNOSIS — Z3202 Encounter for pregnancy test, result negative: Secondary | ICD-10-CM

## 2021-02-14 DIAGNOSIS — Z30431 Encounter for routine checking of intrauterine contraceptive device: Secondary | ICD-10-CM | POA: Diagnosis not present

## 2021-02-14 LAB — POCT URINE PREGNANCY: Preg Test, Ur: NEGATIVE

## 2021-02-14 NOTE — Progress Notes (Signed)
GYNECOLOGY OFFICE VISIT NOTE  History:  35 y.o. G1P1001 here today for IUD checkup. She has had Mirena in place since 2019 and very happy with it. Checked it last night and felt the strings much further down than usual. She is concerned about IUD being in right place. No other issues. Has irregular spotting on it. She denies any abnormal vaginal discharge, bleeding, pelvic pain or other concerns.   Past Medical History:  Diagnosis Date  . Asthma   . Diabetes mellitus without complication ([place])   . Hypertension   . Mental disorder     Past Surgical History:  Procedure Laterality Date  . CESAREAN SECTION    . KNEE SURGERY  04/20/2006     Current Outpatient Medications:  .  ACCU-CHEK GUIDE test strip, , Disp: , Rfl:  .  AIMOVIG 70 MG/ML SOAJ, INJECT 70 MG INTO THE SKIN EVERY 30 (THIRTY) DAYS., Disp: 1 mL, Rfl: 5 .  albuterol (VENTOLIN HFA) 108 (90 Base) MCG/ACT inhaler, Inhale 2 puffs into the lungs every 6 (six) hours as needed for wheezing or shortness of breath., Disp: 8 g, Rfl: 0 .  aspirin 81 MG chewable tablet, Chew 1 tablet by mouth daily., Disp: , Rfl:  .  Blood Glucose Monitoring Suppl (ACCU-CHEK AVIVA PLUS) w/Device KIT, Accu-Chek Aviva Plus Meter  USE AS DIRECTED, Disp: , Rfl:  .  Continuous Blood Gluc Receiver ([place]) DEVI, DISPENSE AND USE AS DIRECTED, Disp: , Rfl:  .  Continuous Blood Gluc Sensor (DEXCOM G6 SENSOR) MISC, Apply topically., Disp: , Rfl:  .  Continuous Blood Gluc Transmit (DEXCOM G6 TRANSMITTER) MISC, , Disp: , Rfl:  .  diclofenac sodium (VOLTAREN) 1 % GEL, Apply 4 g topically 4 (four) times daily. To affected joint., Disp: 100 g, Rfl: 11 .  Dulaglutide (TRULICITY) 1.09 UE/4.5WU SOPN, Inject 0.75 mg into the skin once a week., Disp: 4 pen, Rfl: 11 .  escitalopram (LEXAPRO) 10 MG tablet, TAKE 1 TABLET (10 MG TOTAL) BY MOUTH AT BEDTIME. TAKE W/ 5 MG TABLET FOR TOTAL 15 MG DAILY, Disp: 90 tablet, Rfl: 1 .  escitalopram (LEXAPRO) 5 MG tablet, TAKE  1 TABLET (5 MG TOTAL) BY MOUTH DAILY. TAKE WITH 10 MG TABLETS FOR TOTAL OF 15 MG DAILY, Disp: 90 tablet, Rfl: 3 .  insulin detemir (LEVEMIR) 100 UNIT/ML injection, Inject 70 Units into the skin. , Disp: , Rfl:  .  Insulin Pen Needle (BD PEN NEEDLE NANO U/F) 32G X 4 MM MISC, Use with levemir daily, Disp: , Rfl:  .  Insulin Syringe-Needle U-100 31G X 15/64" 0.3 ML MISC, by Does not apply route., Disp: , Rfl:  .  ipratropium (ATROVENT) 0.06 % nasal spray, Place 2 sprays into both nostrils 4 (four) times daily. As needed for runny nose / postnasal drip, Disp: 15 mL, Rfl: 1 .  levonorgestrel (MIRENA) 20 MCG/24HR IUD, 1 each by Intrauterine route once., Disp: , Rfl:  .  lidocaine (XYLOCAINE) 2 % solution, Use as directed 5-10 mLs in the mouth or throat every 3 (three) hours as needed., Disp: 100 mL, Rfl: 1 .  lisinopril (ZESTRIL) 10 MG tablet, Take 1 tablet by mouth daily., Disp: , Rfl:  .  loratadine (CLARITIN) 10 MG tablet, Take 1 tablet (10 mg total) by mouth daily., Disp: 90 tablet, Rfl: 3 .  meloxicam (MOBIC) 15 MG tablet, One tab PO qAM with a meal for 2 weeks, then daily prn pain., Disp: 30 tablet, Rfl: 3 .  omeprazole (  PRILOSEC) 10 MG capsule, TAKE 1 CAPSULE BY MOUTH EVERY DAY, Disp: 90 capsule, Rfl: 3 .  ondansetron (ZOFRAN-ODT) 8 MG disintegrating tablet, Take 1 tablet (8 mg total) by mouth every 8 (eight) hours as needed for nausea., Disp: 30 tablet, Rfl: 1 .  pregabalin (LYRICA) 75 MG capsule, TAKE 1 CAPSULE BY MOUTH TWICE A DAY, Disp: 180 capsule, Rfl: 0 .  QUEtiapine (SEROQUEL) 100 MG tablet, Take 1 tablet (100 mg total) by mouth at bedtime., Disp: 90 tablet, Rfl: 3 .  SUMAtriptan (IMITREX) 50 MG tablet, Take 0.5-1 tablets (25-50 mg total) by mouth every 2 (two) hours as needed for migraine. May repeat in 2 hours if headache persists or recurs., Disp: 10 tablet, Rfl: 0 .  traMADol (ULTRAM) 50 MG tablet, Take 1-2 tablets (50-100 mg total) by mouth every 8 (eight) hours as needed for moderate  pain. Maximum 6 tabs per day., Disp: 21 tablet, Rfl: 0 .  metFORMIN (GLUCOPHAGE XR) 500 MG 24 hr tablet, Take 2 tablets (1,000 mg total) by mouth daily with breakfast., Disp: 180 tablet, Rfl: 1  The following portions of the patient's history were reviewed and updated as appropriate: allergies, current medications, past family history, past medical history, past social history, past surgical history and problem list.   Review of Systems:  Pertinent items noted in HPI and remainder of comprehensive ROS otherwise negative.   Objective:  Physical Exam BP 116/82   Pulse 79   Resp 16   Ht 5' 4"  (1.626 m)   Wt 221 lb (100.2 kg)   BMI 37.93 kg/m  CONSTITUTIONAL: Well-developed, well-nourished female in no acute distress.  HENT:  Normocephalic, atraumatic. External right and left ear normal. Oropharynx is clear and moist EYES: Conjunctivae and EOM are normal. Pupils are equal, round, and reactive to light. No scleral icterus.  NECK: Normal range of motion, supple, no masses SKIN: Skin is warm and dry. No rash noted. Not diaphoretic. No erythema. No pallor. NEUROLOGIC: Alert and oriented to person, place, and time. Normal reflexes, muscle tone coordination. No cranial nerve deficit noted. PSYCHIATRIC: Normal mood and affect. Normal behavior. Normal judgment and thought content. CARDIOVASCULAR: Normal heart rate noted RESPIRATORY: Effort normal, no problems with respiration noted ABDOMEN: Soft, no distention noted.   PELVIC: Normal appearing external genitalia; normal appearing vaginal mucosa and cervix.  IUD strings protruding from cervix 2 cm. No abnormal discharge noted.   MUSCULOSKELETAL: Normal range of motion. No edema noted.  Exam done with chaperone present.  Labs and Imaging No results found.  Assessment & Plan:   1. IUD check up IUD in place, likely easier to reach with some normal descent of cervix/uterus - POCT urine pregnancy   Routine preventative health maintenance  measures emphasized. Please refer to After Visit Summary for other counseling recommendations.   Return for annual.    K. Arvilla Meres, MD, [place] for [facility])

## 2021-02-15 ENCOUNTER — Other Ambulatory Visit: Payer: Self-pay | Admitting: Osteopathic Medicine

## 2021-02-27 ENCOUNTER — Other Ambulatory Visit: Payer: Self-pay | Admitting: Sports Medicine

## 2021-02-27 ENCOUNTER — Ambulatory Visit (INDEPENDENT_AMBULATORY_CARE_PROVIDER_SITE_OTHER): Payer: Medicaid Other | Admitting: Sports Medicine

## 2021-02-27 ENCOUNTER — Other Ambulatory Visit: Payer: Self-pay

## 2021-02-27 DIAGNOSIS — M222X2 Patellofemoral disorders, left knee: Secondary | ICD-10-CM

## 2021-02-27 DIAGNOSIS — M65831 Other synovitis and tenosynovitis, right forearm: Secondary | ICD-10-CM

## 2021-02-27 DIAGNOSIS — M222X1 Patellofemoral disorders, right knee: Secondary | ICD-10-CM | POA: Diagnosis not present

## 2021-02-27 MED ORDER — ETODOLAC 300 MG PO CAPS: 300.0000 mg | ORAL_CAPSULE | Freq: Three times a day (TID) | ORAL | 3 refills | Status: DC | PRN

## 2021-02-27 NOTE — Assessment & Plan Note (Signed)
This is a pleasant 34 year old female, she had what sounds to be a lateral release on the left sometime ago, approximately 2007. She is starting to have recurrence of pain in her left knee, exam is fairly benign, minimal to no patellar grind, negative apprehension sign, however considering recurrence of discomfort after lateral release she is likely a candidate for a Fulkerson slide. I would like her to return to Dr. Jonnie Kind for further discussion. Would like her to go ahead and start some physical therapy as well, if she does have surgery she will certainly needed afterwards. We will also switch her to a different NSAID, she does have some difficulty swallowing tablets.

## 2021-02-27 NOTE — Progress Notes (Signed)
    Procedures performed today:    None.  Independent interpretation of notes and tests performed by another provider:   None.  Brief History, Exam, Impression, and Recommendations:    Patellofemoral syndrome, bilateral This is a pleasant 35 year old female, she had what sounds to be a lateral release on the left sometime ago, approximately 2007. She is starting to have recurrence of pain in her left knee, exam is fairly benign, minimal to no patellar grind, negative apprehension sign, however considering recurrence of discomfort after lateral release she is likely a candidate for a Fulkerson slide. I would like her to return to Dr. Jonnie Kind for further discussion. Would like her to go ahead and start some physical therapy as well, if she does have surgery she will certainly needed afterwards. We will also switch her to a different NSAID, she does have some difficulty swallowing tablets.  Extensor intersection syndrome of right wrist Recurrence of right wrist pain, historically did well after an extensor intersection syndrome injection earlier this year. Symptoms today are over the first extensor compartment and strongly resemble de Quervain's tendinitis. Lourena Simmonds sign is only minimally positive however. There is also a small ganglion that feels to be associated with the first extensor compartment. We will try different NSAID and some de Quervain's tendinitis type rehabilitation exercises before considering repeat injection.    ___________________________________________ Ihor Austin. Benjamin Stain, M.D., ABFM., CAQSM. Primary Care and Sports Medicine [facility]  Adjunct Instructor of Family Medicine  [employer]

## 2021-02-27 NOTE — Assessment & Plan Note (Signed)
Recurrence of right wrist pain, historically did well after an extensor intersection syndrome injection earlier this year. Symptoms today are over the first extensor compartment and strongly resemble de Quervain's tendinitis. Lourena Simmonds sign is only minimally positive however. There is also a small ganglion that feels to be associated with the first extensor compartment. We will try different NSAID and some de Quervain's tendinitis type rehabilitation exercises before considering repeat injection.

## 2021-03-04 ENCOUNTER — Other Ambulatory Visit: Payer: Self-pay | Admitting: Sports Medicine

## 2021-03-04 DIAGNOSIS — M222X1 Patellofemoral disorders, right knee: Secondary | ICD-10-CM

## 2021-03-04 DIAGNOSIS — M222X2 Patellofemoral disorders, left knee: Secondary | ICD-10-CM

## 2021-03-04 MED ORDER — ETODOLAC ER 500 MG PO TB24: 500.0000 mg | ORAL_TABLET | Freq: Every day | ORAL | 11 refills | Status: DC

## 2021-03-04 NOTE — Telephone Encounter (Signed)
$  29 at [facility] for the 500 mg form, calling this in.  I have added a good Rx coupon to the prescription as well.  https://www.goodrx.com/etodolac-[email or web address]-er&form=tablet&dosage=500mg &quantity=30&label_override=etodolac-er

## 2021-03-07 ENCOUNTER — Ambulatory Visit: Payer: Medicaid Other | Admitting: Obstetrics & Gynecology

## 2021-03-11 ENCOUNTER — Other Ambulatory Visit: Payer: Self-pay | Admitting: Osteopathic Medicine

## 2021-03-12 DIAGNOSIS — I10 Essential (primary) hypertension: Secondary | ICD-10-CM

## 2021-03-12 HISTORY — DX: Essential (primary) hypertension: I10

## 2021-03-14 ENCOUNTER — Ambulatory Visit: Payer: Medicaid Other | Admitting: Physical Therapy

## 2021-03-26 ENCOUNTER — Other Ambulatory Visit: Payer: Self-pay | Admitting: Osteopathic Medicine

## 2021-03-26 DIAGNOSIS — F419 Anxiety disorder, unspecified: Secondary | ICD-10-CM

## 2021-04-04 ENCOUNTER — Ambulatory Visit (INDEPENDENT_AMBULATORY_CARE_PROVIDER_SITE_OTHER): Payer: Medicaid Other | Admitting: Osteopathic Medicine

## 2021-04-04 ENCOUNTER — Other Ambulatory Visit: Payer: Self-pay

## 2021-04-04 VITALS — BP 120/71 | HR 83 | Temp 98.6°F | Wt 219.0 lb

## 2021-04-04 DIAGNOSIS — R42 Dizziness and giddiness: Secondary | ICD-10-CM

## 2021-04-04 DIAGNOSIS — M2550 Pain in unspecified joint: Secondary | ICD-10-CM

## 2021-04-04 DIAGNOSIS — R110 Nausea: Secondary | ICD-10-CM

## 2021-04-04 DIAGNOSIS — M549 Dorsalgia, unspecified: Secondary | ICD-10-CM | POA: Diagnosis not present

## 2021-04-04 DIAGNOSIS — E119 Type 2 diabetes mellitus without complications: Secondary | ICD-10-CM

## 2021-04-04 DIAGNOSIS — R5382 Chronic fatigue, unspecified: Secondary | ICD-10-CM

## 2021-04-04 MED ORDER — ONDANSETRON HCL 4 MG PO TABS
8.0000 mg | ORAL_TABLET | Freq: Once | ORAL | Status: AC
  Administered 2021-04-04: 8 mg via ORAL

## 2021-04-04 MED ORDER — KETOROLAC TROMETHAMINE 60 MG/2ML IM SOLN
60.0000 mg | Freq: Once | INTRAMUSCULAR | Status: AC
  Administered 2021-04-04: 60 mg via INTRAMUSCULAR

## 2021-04-04 MED ORDER — PREDNISONE 10 MG PO TABS: ORAL_TABLET | ORAL | 0 refills | Status: AC

## 2021-04-04 MED ORDER — MELOXICAM 15 MG PO TABS: 15.0000 mg | ORAL_TABLET | Freq: Every day | ORAL | 0 refills | Status: DC

## 2021-04-04 NOTE — Progress Notes (Signed)
Rachael Fowler is a 35 y.o. female who presents to  [place] at [facility]  today, 04/04/21, seeking care for the following:  Body aches, back pain, joint pain. Has been doing more physical exertion cleaning her house lately, preparing for knee surgery. She reports middle back pain, usually has lower pain / SI joint pain. Recent UC visit for dififculty urinating and back pain, UA and UCx negative. She reports negative pregnancy test recently when she went in for IUD checkup, last UPT I see on file was 02/14/21. Reports fatigue, feeling dizzy when standing.      ASSESSMENT & PLAN with other pertinent findings:  The primary encounter diagnosis was Postural dizziness. Diagnoses of Mid back pain, Multiple joint pain, Nausea, Chronic fatigue, and Type 2 diabetes mellitus without complication, without long-term current use of insulin (HCC) were also pertinent to this visit.   Orthostatics WNL Labs pending to eval dizziness Pt denies hypoglycemia  Reports she did take one of her om's oxycodone yesterday Advised do NOT take others' Rx but I appreciate the honesty  Patient Instructions  Plan: Toradol shot today in the office Rx sent for Meloxicam antiinflammatory t Rx sent for lower-dose prednisone taper to help pain flare Labs today to further evaluate pain and nausea issues    Orders Placed This Encounter  Procedures   CBC with Differential/Platelet   COMPLETE METABOLIC PANEL WITH GFR   TSH   Urinalysis with Culture Reflex   Pregnancy, urine     Meds ordered this encounter  Medications   predniSONE (DELTASONE) 10 MG tablet    Sig: Take 2 tablets (20 mg total) by mouth 2 (two) times daily with a meal for 2 days, THEN 1 tablet (10 mg total) 2 (two) times daily with a meal for 2 days, THEN 0.5 tablets (5 mg total) 2 (two) times daily with a meal for 2 days.    Dispense:  14 tablet    Refill:  0   meloxicam (MOBIC) 15 MG tablet    Sig: Take  1 tablet (15 mg total) by mouth daily. As needed for aches/pains    Dispense:  90 tablet    Refill:  0   ketorolac (TORADOL) injection 60 mg   ondansetron (ZOFRAN) tablet 8 mg      See below for relevant physical exam findings  See below for recent lab and imaging results reviewed  Medications, allergies, PMH, PSH, SocH, FamH reviewed below    Follow-up instructions: Return for RECHECK PENDING RESULTS / IF WORSE OR CHANGE.                                        Exam:  BP 120/71 (BP Location: Left Arm, Patient Position: Sitting, Cuff Size: Large)   Pulse 83   Temp 98.6 F (37 C) (Oral)   Wt 219 lb (99.3 kg)   BMI 37.59 kg/m  Constitutional: VS see above. General Appearance: alert, well-developed, well-nourished, NAD Neck: No masses, trachea midline.  Respiratory: Normal respiratory effort. no wheeze, no rhonchi, no rales Cardiovascular: S1/S2 normal, no murmur, no rub/gallop auscultated. RRR.  Musculoskeletal: Gait normal. Symmetric and independent movement of all extremities Neurological: Normal balance/coordination. No tremor. EOMI.  Skin: warm, dry, intact.  Psychiatric: Normal judgment/insight. Normal mood and affect. Oriented x3.   Current Meds  Medication Sig   ACCU-CHEK GUIDE test strip  AIMOVIG 70 MG/ML SOAJ INJECT 70 MG INTO THE SKIN EVERY 30 (THIRTY) DAYS.   albuterol (VENTOLIN HFA) 108 (90 Base) MCG/ACT inhaler Inhale 2 puffs into the lungs every 6 (six) hours as needed for wheezing or shortness of breath.   aspirin 81 MG chewable tablet Chew 1 tablet by mouth daily.   Blood Glucose Monitoring Suppl (ACCU-CHEK AVIVA PLUS) w/Device KIT Accu-Chek Aviva Plus Meter  USE AS DIRECTED   Continuous Blood Gluc Receiver (DEXCOM G6 RECEIVER) DEVI DISPENSE AND USE AS DIRECTED   Continuous Blood Gluc Sensor (DEXCOM G6 SENSOR) MISC Apply topically.   Continuous Blood Gluc Transmit (DEXCOM G6 TRANSMITTER) MISC    diclofenac sodium  (VOLTAREN) 1 % GEL Apply 4 g topically 4 (four) times daily. To affected joint.   Dulaglutide (TRULICITY) 0.34 VQ/2.5ZD SOPN Inject 0.75 mg into the skin once a week.   escitalopram (LEXAPRO) 10 MG tablet TAKE 1 TABLET (10 MG TOTAL) BY MOUTH AT BEDTIME. TAKE W/ 5 MG TABLET FOR TOTAL 15 MG DAILY   escitalopram (LEXAPRO) 5 MG tablet TAKE 1 TABLET (5 MG TOTAL) BY MOUTH DAILY. TAKE WITH 10 MG TABLETS FOR TOTAL OF 15 MG DAILY   insulin detemir (LEVEMIR) 100 UNIT/ML injection Inject 70 Units into the skin.    Insulin Pen Needle (BD PEN NEEDLE NANO U/F) 32G X 4 MM MISC Use with levemir daily   Insulin Syringe-Needle U-100 31G X 15/64" 0.3 ML MISC by Does not apply route.   ipratropium (ATROVENT) 0.06 % nasal spray Place 2 sprays into both nostrils 4 (four) times daily. As needed for runny nose / postnasal drip   levonorgestrel (MIRENA) 20 MCG/24HR IUD 1 each by Intrauterine route once.   lidocaine (XYLOCAINE) 2 % solution Use as directed 5-10 mLs in the mouth or throat every 3 (three) hours as needed.   lisinopril (ZESTRIL) 10 MG tablet Take 1 tablet by mouth daily.   loratadine (CLARITIN) 10 MG tablet TAKE 1 TABLET BY MOUTH EVERY DAY   meloxicam (MOBIC) 15 MG tablet Take 1 tablet (15 mg total) by mouth daily. As needed for aches/pains   omeprazole (PRILOSEC) 10 MG capsule TAKE 1 CAPSULE BY MOUTH EVERY DAY   ondansetron (ZOFRAN-ODT) 8 MG disintegrating tablet Take 1 tablet (8 mg total) by mouth every 8 (eight) hours as needed for nausea.   predniSONE (DELTASONE) 10 MG tablet Take 2 tablets (20 mg total) by mouth 2 (two) times daily with a meal for 2 days, THEN 1 tablet (10 mg total) 2 (two) times daily with a meal for 2 days, THEN 0.5 tablets (5 mg total) 2 (two) times daily with a meal for 2 days.   pregabalin (LYRICA) 75 MG capsule TAKE 1 CAPSULE BY MOUTH TWICE A DAY   QUEtiapine (SEROQUEL) 100 MG tablet Take 1 tablet (100 mg total) by mouth at bedtime.   SUMAtriptan (IMITREX) 50 MG tablet Take 0.5-1  tablets (25-50 mg total) by mouth every 2 (two) hours as needed for migraine. May repeat in 2 hours if headache persists or recurs.   traMADol (ULTRAM) 50 MG tablet Take 1-2 tablets (50-100 mg total) by mouth every 8 (eight) hours as needed for moderate pain. Maximum 6 tabs per day.    Allergies  Allergen Reactions   Hydrocodone Itching, Nausea And Vomiting and Other (See Comments)    Also nausea and vomiting    Penicillins Itching, Nausea And Vomiting and Other (See Comments)   Aripiprazole     Very restless, extreme bilateral arm  pain   Penicillin G Itching and Nausea And Vomiting   Hydrocodone-Acetaminophen Itching and Nausea And Vomiting    Patient Active Problem List   Diagnosis Date Noted   Extensor intersection syndrome of right wrist 11/16/2020   Asthma exacerbation 10/10/2020   Wrist pain, acute, right 05/30/2020   Mood disorder ([place]) 07/14/2019   Patellofemoral syndrome, bilateral 08/17/2018   Chronic bilateral low back pain without sciatica 08/17/2018   Pain of left heel 07/14/2018   Pain of right heel 07/14/2018   Leukocytosis 07/14/2018   Hirsutism 05/26/2018   Abnormal WBC count 05/26/2018   Psychophysiological insomnia 05/26/2018   Anxiety and depression 05/26/2018   Chronic tension-type headache, not intractable 05/26/2018   Moderate persistent asthma with exacerbation 02/05/2018   Type 2 diabetes mellitus without complication, without long-term current use of insulin ([place]) 08/19/2017   Pregnancy 08/19/2017   OTHER ACUTE REACTIONS TO STRESS 12/04/2009    Family History  Problem Relation Age of Onset   Depression Mother    Diabetes Mother    Hyperlipidemia Mother    Hypertension Mother    Hyperlipidemia Father    Hypertension Father    Cancer Maternal Uncle    Diabetes Maternal Uncle    Cancer Maternal Grandmother    Hypertension Maternal Grandmother    Hyperlipidemia Maternal Grandmother    Hyperlipidemia Paternal Grandmother     Social History    Tobacco Use  Smoking Status Former   Packs/day: 0.50   Pack years: 0.00   Types: Cigarettes   Quit date: 01/22/2020   Years since quitting: 1.2  Smokeless Tobacco Never    Past Surgical History:  Procedure Laterality Date   CESAREAN SECTION     KNEE SURGERY  04/20/2006    Immunization History  Administered Date(s) Administered   Influenza, Seasonal, Injecte, Preservative Fre 09/10/2017   Influenza,inj,Quad PF,6+ Mos 09/10/2017, 07/14/2018   Influenza,inj,quad, With Preservative 07/11/2019, 08/16/2020   Influenza-Unspecified 09/10/2017   Tdap 01/21/2018    Recent Results (from the past 2160 hour(s))  POCT urine pregnancy     Status: Normal   Collection Time: 02/14/21 10:59 AM  Result Value Ref Range   Preg Test, Ur Negative Negative    No results found.     All questions at time of visit were answered - patient instructed to contact office with any additional concerns or updates. ER/RTC precautions were reviewed with the patient as applicable.   Please note: manual typing as well as voice recognition software may have been used to produce this document - typos may escape review. Please contact Dr. Sheppard Coil for any needed clarifications.

## 2021-04-04 NOTE — Patient Instructions (Addendum)
Plan: Toradol shot today in the office Rx sent for Meloxicam antiinflammatory t Rx sent for lower-dose prednisone taper to help pain flare Labs today to further evaluate pain and nausea issues

## 2021-04-05 LAB — CBC WITH DIFFERENTIAL/PLATELET
Absolute Monocytes: 845 cells/uL (ref 200–950)
Basophils Absolute: 102 cells/uL (ref 0–200)
Basophils Relative: 0.8 %
Eosinophils Absolute: 128 cells/uL (ref 15–500)
Eosinophils Relative: 1 %
HCT: 39 % (ref 35.0–45.0)
Hemoglobin: 12.9 g/dL (ref 11.7–15.5)
Lymphs Abs: 3430 cells/uL (ref 850–3900)
MCH: 28.9 pg (ref 27.0–33.0)
MCHC: 33.1 g/dL (ref 32.0–36.0)
MCV: 87.2 fL (ref 80.0–100.0)
MPV: 9.8 fL (ref 7.5–12.5)
Monocytes Relative: 6.6 %
Neutro Abs: 8294 cells/uL — ABNORMAL HIGH (ref 1500–7800)
Neutrophils Relative %: 64.8 %
Platelets: 414 10*3/uL — ABNORMAL HIGH (ref 140–400)
RBC: 4.47 10*6/uL (ref 3.80–5.10)
RDW: 12.8 % (ref 11.0–15.0)
Total Lymphocyte: 26.8 %
WBC: 12.8 10*3/uL — ABNORMAL HIGH (ref 3.8–10.8)

## 2021-04-05 LAB — URINALYSIS W MICROSCOPIC + REFLEX CULTURE
Bacteria, UA: NONE SEEN /HPF
Bilirubin Urine: NEGATIVE
Glucose, UA: NEGATIVE
Hgb urine dipstick: NEGATIVE
Hyaline Cast: NONE SEEN /LPF
Ketones, ur: NEGATIVE
Leukocyte Esterase: NEGATIVE
Nitrites, Initial: NEGATIVE
Protein, ur: NEGATIVE
RBC / HPF: NONE SEEN /HPF (ref 0–2)
Specific Gravity, Urine: 1.009 (ref 1.001–1.035)
pH: 7.5 (ref 5.0–8.0)

## 2021-04-05 LAB — COMPLETE METABOLIC PANEL WITH GFR
AG Ratio: 1.8 (calc) (ref 1.0–2.5)
ALT: 30 U/L — ABNORMAL HIGH (ref 6–29)
AST: 50 U/L — ABNORMAL HIGH (ref 10–30)
Albumin: 4.1 g/dL (ref 3.6–5.1)
Alkaline phosphatase (APISO): 60 U/L (ref 31–125)
BUN: 9 mg/dL (ref 7–25)
CO2: 31 mmol/L (ref 20–32)
Calcium: 10 mg/dL (ref 8.6–10.2)
Chloride: 102 mmol/L (ref 98–110)
Creat: 0.64 mg/dL (ref 0.50–1.10)
GFR, Est African American: 137 mL/min/{1.73_m2} (ref >=60)
GFR, Est Non African American: 118 mL/min/{1.73_m2} (ref >=60)
Globulin: 2.3 g/dL (calc) (ref 1.9–3.7)
Glucose, Bld: 89 mg/dL (ref 65–99)
Potassium: 4.2 mmol/L (ref 3.5–5.3)
Sodium: 139 mmol/L (ref 135–146)
Total Bilirubin: 0.3 mg/dL (ref 0.2–1.2)
Total Protein: 6.4 g/dL (ref 6.1–8.1)

## 2021-04-05 LAB — PREGNANCY, URINE: Preg Test, Ur: NEGATIVE

## 2021-04-05 LAB — TSH: TSH: 1.24 mIU/L

## 2021-04-05 LAB — NO CULTURE INDICATED

## 2021-04-10 ENCOUNTER — Encounter: Payer: Self-pay | Admitting: Osteopathic Medicine

## 2021-04-10 MED ORDER — QUETIAPINE FUMARATE 100 MG PO TABS: 100.0000 mg | ORAL_TABLET | Freq: Every day | ORAL | 3 refills | Status: DC

## 2021-04-15 ENCOUNTER — Encounter: Payer: Self-pay | Admitting: Osteopathic Medicine

## 2021-05-15 ENCOUNTER — Encounter: Payer: Self-pay | Admitting: Medical-Surgical

## 2021-05-15 ENCOUNTER — Ambulatory Visit (INDEPENDENT_AMBULATORY_CARE_PROVIDER_SITE_OTHER): Payer: Medicaid Other | Admitting: Medical-Surgical

## 2021-05-15 ENCOUNTER — Other Ambulatory Visit: Payer: Self-pay

## 2021-05-15 VITALS — BP 95/65 | HR 101 | Temp 98.8°F | Ht 64.0 in | Wt 213.3 lb

## 2021-05-15 DIAGNOSIS — F902 Attention-deficit hyperactivity disorder, combined type: Secondary | ICD-10-CM

## 2021-05-15 DIAGNOSIS — F419 Anxiety disorder, unspecified: Secondary | ICD-10-CM | POA: Diagnosis not present

## 2021-05-15 DIAGNOSIS — F32A Depression, unspecified: Secondary | ICD-10-CM

## 2021-05-15 DIAGNOSIS — R41840 Attention and concentration deficit: Secondary | ICD-10-CM

## 2021-05-15 MED ORDER — BUSPIRONE HCL 5 MG PO TABS: 5.0000 mg | ORAL_TABLET | Freq: Two times a day (BID) | ORAL | 0 refills | Status: DC

## 2021-05-15 MED ORDER — LISDEXAMFETAMINE DIMESYLATE 30 MG PO CAPS: 30.0000 mg | ORAL_CAPSULE | Freq: Every day | ORAL | 0 refills | Status: DC

## 2021-05-15 NOTE — Progress Notes (Signed)
  HPI with pertinent ROS:   CC: ADHD  HPI: Pleasant 35 year old female presenting today with reports of being diagnosed with ADHD and would like to discuss starting medications.  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  She has never taken medications for this before but is very interested in starting something now.  Notes that her day mainly consist of taking care of her 91-year-old as well as her mother.  Waking times vary according to the 61-year-old but she is usually in bed by 930 to 10:30 at night.  She does have significant difficulty sleeping and often takes medications that are either over-the-counter or not prescribed for her to help her sleep.  Having significant difficulty with managing anxiety and notes that her main stressor is her mother so she feels like there is no use for counseling at this time.  Denies SI/HI.  I reviewed the past medical history, family history, social history, surgical history, and allergies today and no changes were needed.  Please see the problem list section below in epic for further details.   Physical exam:   General: Well Developed, well nourished, and in no acute distress.  Neuro: Alert and oriented x3, extra-ocular muscles intact, sensation grossly intact.  HEENT: Normocephalic, atraumatic, pupils equal round reactive to light, neck supple, no masses, no lymphadenopathy, thyroid nonpalpable.  Skin: Warm and dry, no rashes. Cardiac: Regular rate and rhythm, no murmurs rubs or gallops, no lower extremity edema.  Respiratory: Clear to auscultation bilaterally. Not using accessory muscles, speaking in full sentences.  Impression and Recommendations:    1. Attention deficit hyperactivity disorder (ADHD), combined type Discussed various options for treating ADHD and the associated potential side effects.  Starting Vyvanse 30 mg daily.  2.  Anxiety and depression Continue Lexapro and Seroquel as ordered.  Advised strongly against using medications that are not prescribed for her to help with sleep.  Starting BuSpar 5-15 mg twice daily for anxiety management.  Strongly recommend counseling.  Return in about 4 weeks (around 06/12/2021) for ADHD follow up with PCP. ___________________________________________ Thayer Ohm, DNP, APRN, FNP-BC Primary Care and Sports Medicine [facility]

## 2021-05-17 LAB — HEMOGLOBIN A1C: Hemoglobin A1C: 7.2

## 2021-05-29 ENCOUNTER — Other Ambulatory Visit: Payer: Self-pay

## 2021-05-29 DIAGNOSIS — M545 Low back pain, unspecified: Secondary | ICD-10-CM

## 2021-05-29 DIAGNOSIS — G8929 Other chronic pain: Secondary | ICD-10-CM

## 2021-05-29 MED ORDER — PREGABALIN 75 MG PO CAPS: 75.0000 mg | ORAL_CAPSULE | Freq: Two times a day (BID) | ORAL | 0 refills | Status: DC

## 2021-05-29 NOTE — Telephone Encounter (Signed)
[facility] pharmacy requesting med refill for pregabalin. Preferred pharmacy updated. Rx pended.

## 2021-05-31 ENCOUNTER — Other Ambulatory Visit: Payer: Self-pay | Admitting: Medical-Surgical

## 2021-06-12 ENCOUNTER — Other Ambulatory Visit: Payer: Self-pay | Admitting: Osteopathic Medicine

## 2021-06-19 ENCOUNTER — Ambulatory Visit (INDEPENDENT_AMBULATORY_CARE_PROVIDER_SITE_OTHER): Payer: Medicaid Other | Admitting: Osteopathic Medicine

## 2021-06-19 ENCOUNTER — Other Ambulatory Visit: Payer: Self-pay

## 2021-06-19 VITALS — BP 110/71 | HR 103 | Temp 99.3°F | Wt 211.1 lb

## 2021-06-19 DIAGNOSIS — F902 Attention-deficit hyperactivity disorder, combined type: Secondary | ICD-10-CM | POA: Diagnosis not present

## 2021-06-19 DIAGNOSIS — F419 Anxiety disorder, unspecified: Secondary | ICD-10-CM | POA: Diagnosis not present

## 2021-06-19 DIAGNOSIS — F32A Depression, unspecified: Secondary | ICD-10-CM

## 2021-06-19 DIAGNOSIS — Z23 Encounter for immunization: Secondary | ICD-10-CM | POA: Diagnosis not present

## 2021-06-19 DIAGNOSIS — E119 Type 2 diabetes mellitus without complications: Secondary | ICD-10-CM | POA: Diagnosis not present

## 2021-06-19 MED ORDER — ESCITALOPRAM OXALATE 20 MG PO TABS: ORAL_TABLET | ORAL | 1 refills | Status: DC

## 2021-06-19 MED ORDER — LISDEXAMFETAMINE DIMESYLATE 40 MG PO CAPS: 40.0000 mg | ORAL_CAPSULE | Freq: Every day | ORAL | 0 refills | Status: DC

## 2021-06-19 NOTE — Patient Instructions (Signed)
Increase Vyvnase form 30 mg to 40 mg  Increase Lexapro from 15 mg to 20 mg may help w/ sleep and anxiety

## 2021-06-19 NOTE — Progress Notes (Signed)
Rachael Fowler is a 35 y.o. female who presents to  [place] at [facility]  today, 06/19/21, seeking care for the following:  ADHD follow-up. Recently started on Vyvanse, notes some imrpvement but still a lot of trouble staying on task. Longstanding insomnia has not gotten worse on Rx. No increased anxiety or heart palpitations, Notes decreased appetite on Vyvanse which is helping w/ intentional weight loss      ASSESSMENT & PLAN with other pertinent findings:  The primary encounter diagnosis was Attention deficit hyperactivity disorder (ADHD), combined type. Diagnoses of Need for influenza vaccination, Anxiety and depression, and Type 2 diabetes mellitus without complication, without long-term current use of insulin ([place]) were also pertinent to this visit.    Patient Instructions  Increase Vyvnase form 30 mg to 40 mg  Increase Lexapro from 15 mg to 20 mg may help w/ sleep and anxiety   Orders Placed This Encounter  Procedures   Flu Vaccine QUAD 6+ mos PF IM (Fluarix Quad PF)   Hemoglobin A1c    Meds ordered this encounter  Medications   lisdexamfetamine (VYVANSE) 40 MG capsule    Sig: Take 1 capsule (40 mg total) by mouth daily.    Dispense:  30 capsule    Refill:  0   escitalopram (LEXAPRO) 20 MG tablet    Sig: One half tab daily for a week then one tab by mouth daily    Dispense:  30 tablet    Refill:  1     See below for relevant physical exam findings  See below for recent lab and imaging results reviewed  Medications, allergies, PMH, PSH, SocH, FamH reviewed below    Follow-up instructions: Return in about 4 weeks (around 07/17/2021) for RECHECK ON NEW MEDICATION, SEE [place] SOONER IF NEEDED. CALL/MESSAGE W/ QUESTIONS.                                        Exam:  BP 110/71 (BP Location: Left Arm, Patient Position: Sitting, Cuff Size: Large)   Pulse (!) 103   Temp 99.3 F (37.4 C)  (Oral)   Wt 211 lb 1.3 oz (95.7 kg)   BMI 36.23 kg/m  Constitutional: VS see above. General Appearance: alert, well-developed, well-nourished, NAD Neck: No masses, trachea midline.  Respiratory: Normal respiratory effort. no wheeze, no rhonchi, no rales Cardiovascular: S1/S2 normal, no murmur, no rub/gallop auscultated. RRR.  Musculoskeletal: Gait normal. Symmetric and independent movement of all extremities Neurological: Normal balance/coordination. No tremor. Skin: warm, dry, intact.  Psychiatric: Normal judgment/insight. Normal mood and affect. Oriented x3.   Current Meds  Medication Sig   ACCU-CHEK GUIDE test strip    albuterol (VENTOLIN HFA) 108 (90 Base) MCG/ACT inhaler Inhale 2 puffs into the lungs every 6 (six) hours as needed for wheezing or shortness of breath.   Blood Glucose Monitoring Suppl (ACCU-CHEK AVIVA PLUS) w/Device KIT Accu-Chek Aviva Plus Meter  USE AS DIRECTED   Continuous Blood Gluc Receiver (DEXCOM G6 RECEIVER) DEVI DISPENSE AND USE AS DIRECTED   Continuous Blood Gluc Sensor (DEXCOM G6 SENSOR) MISC Apply topically.   Continuous Blood Gluc Transmit (DEXCOM G6 TRANSMITTER) MISC    diclofenac sodium (VOLTAREN) 1 % GEL Apply 4 g topically 4 (four) times daily. To affected joint.   Dulaglutide (TRULICITY) 9.67 EL/3.8BO SOPN Inject 0.75 mg into the skin once a week.   Erenumab-aooe ([place])  70 MG/ML SOAJ INJECT 70 MG INTO THE SKIN EVERY 30 (THIRTY) DAYS.   escitalopram (LEXAPRO) 20 MG tablet One half tab daily for a week then one tab by mouth daily   insulin detemir (LEVEMIR) 100 UNIT/ML injection Inject 70 Units into the skin.    Insulin Pen Needle (BD PEN NEEDLE NANO U/F) 32G X 4 MM MISC Use with levemir daily   Insulin Syringe-Needle U-100 31G X 15/64" 0.3 ML MISC by Does not apply route.   ipratropium (ATROVENT) 0.06 % nasal spray Place 2 sprays into both nostrils 4 (four) times daily. As needed for runny nose / postnasal drip   levonorgestrel (MIRENA) 20 MCG/24HR  IUD 1 each by Intrauterine route once.   lidocaine (XYLOCAINE) 2 % solution Use as directed 5-10 mLs in the mouth or throat every 3 (three) hours as needed.   lisinopril (ZESTRIL) 10 MG tablet Take 1 tablet by mouth daily.   loratadine (CLARITIN) 10 MG tablet TAKE 1 TABLET BY MOUTH EVERY DAY   meloxicam (MOBIC) 15 MG tablet Take 1 tablet (15 mg total) by mouth daily. As needed for aches/pains   omeprazole (PRILOSEC) 10 MG capsule TAKE 1 CAPSULE BY MOUTH EVERY DAY   ondansetron (ZOFRAN-ODT) 8 MG disintegrating tablet Take 1 tablet (8 mg total) by mouth every 8 (eight) hours as needed for nausea.   pregabalin (LYRICA) 75 MG capsule Take 1 capsule (75 mg total) by mouth 2 (two) times daily.   QUEtiapine (SEROQUEL) 100 MG tablet Take 1 tablet (100 mg total) by mouth at bedtime.   SUMAtriptan (IMITREX) 50 MG tablet Take 0.5-1 tablets (25-50 mg total) by mouth every 2 (two) hours as needed for migraine. May repeat in 2 hours if headache persists or recurs.   traMADol (ULTRAM) 50 MG tablet Take 1-2 tablets (50-100 mg total) by mouth every 8 (eight) hours as needed for moderate pain. Maximum 6 tabs per day.   [DISCONTINUED] escitalopram (LEXAPRO) 10 MG tablet TAKE 1 TABLET (10 MG TOTAL) BY MOUTH AT BEDTIME. TAKE W/ 5 MG TABLET FOR TOTAL 15 MG DAILY   [DISCONTINUED] escitalopram (LEXAPRO) 5 MG tablet TAKE 1 TABLET (5 MG TOTAL) BY MOUTH DAILY. TAKE WITH 10 MG TABLETS FOR TOTAL OF 15 MG DAILY   [DISCONTINUED] lisdexamfetamine (VYVANSE) 30 MG capsule Take 1 capsule (30 mg total) by mouth daily.    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   Asthma exacerbation 10/10/2020   Wrist pain, acute, right 05/30/2020   Mood disorder ([place]) 07/14/2019   Patellofemoral syndrome, bilateral 08/17/2018   Chronic bilateral low back pain without sciatica 08/17/2018   Pain of left heel 07/14/2018   Pain of right heel 07/14/2018   Leukocytosis 07/14/2018   Hirsutism 05/26/2018   Abnormal WBC count 05/26/2018   Psychophysiological insomnia 05/26/2018   Anxiety and depression 05/26/2018   Chronic tension-type headache, not intractable 05/26/2018   Moderate persistent asthma with exacerbation 02/05/2018   Type 2 diabetes mellitus without complication, without long-term current use of insulin ([place]) 08/19/2017   Pregnancy 08/19/2017  OTHER ACUTE REACTIONS TO STRESS 12/04/2009    Family History  Problem Relation Age of Onset   Depression Mother    Diabetes Mother    Hyperlipidemia Mother    Hypertension Mother    Hyperlipidemia Father    Hypertension Father    Cancer Maternal Uncle    Diabetes Maternal Uncle    Cancer Maternal Grandmother    Hypertension Maternal Grandmother    Hyperlipidemia Maternal Grandmother    Hyperlipidemia Paternal Grandmother     Social History   Tobacco Use  Smoking Status Former   Packs/day: 0.50   Types: Cigarettes   Quit date: 01/22/2020   Years since quitting: 1.4  Smokeless Tobacco Never    Past Surgical History:  Procedure Laterality Date   CESAREAN SECTION     KNEE SURGERY  04/20/2006    Immunization History  Administered Date(s) Administered   DTP 07/17/1988, 10/02/1988, 11/27/1988, 11/12/1989   Hepatitis B 07/19/1999, 08/22/1999   HiB (PRP-OMP) 11/12/1989   Influenza Split 09/10/2017, 07/14/2018, 07/11/2019, 08/16/2020   Influenza, Seasonal, Injecte, Preservative Fre 09/10/2017   Influenza,inj,Quad PF,6+ Mos 09/10/2017, 07/14/2018, 06/19/2021   Influenza,inj,quad, With Preservative 07/11/2019, 08/16/2020    Influenza-Unspecified 09/10/2017, 07/11/2019   MMR 11/12/1989   OPV 07/17/1988, 10/02/1988, 11/12/1989   Td 04/18/2003   Tdap 01/21/2018    Recent Results (from the past 2160 hour(s))  CBC with Differential/Platelet     Status: Abnormal   Collection Time: 04/04/21 12:00 AM  Result Value Ref Range   WBC 12.8 (H) 3.8 - 10.8 Thousand/uL   RBC 4.47 3.80 - 5.10 Million/uL   Hemoglobin 12.9 11.7 - 15.5 g/dL   HCT 39.0 35.0 - 45.0 %   MCV 87.2 80.0 - 100.0 fL   MCH 28.9 27.0 - 33.0 pg   MCHC 33.1 32.0 - 36.0 g/dL   RDW 12.8 11.0 - 15.0 %   Platelets 414 (H) 140 - 400 Thousand/uL   MPV 9.8 7.5 - 12.5 fL   Neutro Abs 8,294 (H) 1,500 - 7,800 cells/uL   Lymphs Abs 3,430 850 - 3,900 cells/uL   Absolute Monocytes 845 200 - 950 cells/uL   Eosinophils Absolute 128 15 - 500 cells/uL   Basophils Absolute 102 0 - 200 cells/uL   Neutrophils Relative % 64.8 %   Total Lymphocyte 26.8 %   Monocytes Relative 6.6 %   Eosinophils Relative 1.0 %   Basophils Relative 0.8 %  COMPLETE METABOLIC PANEL WITH GFR     Status: Abnormal   Collection Time: 04/04/21 12:00 AM  Result Value Ref Range   Glucose, Bld 89 65 - 99 mg/dL    Comment: .            Fasting reference interval .    BUN 9 7 - 25 mg/dL   Creat 0.64 0.50 - 1.10 mg/dL   GFR, Est Non African American 118 > OR = 60 mL/min/1.13m   GFR, Est African American 137 > OR = 60 mL/min/1.730m  BUN/Creatinine Ratio NOT APPLICABLE 6 - 22 (calc)   Sodium 139 135 - 146 mmol/L   Potassium 4.2 3.5 - 5.3 mmol/L   Chloride 102 98 - 110 mmol/L   CO2 31 20 - 32 mmol/L   Calcium 10.0 8.6 - 10.2 mg/dL   Total Protein 6.4 6.1 - 8.1 g/dL   Albumin 4.1 3.6 - 5.1 g/dL   Globulin 2.3 1.9 - 3.7 g/dL (calc)   AG Ratio 1.8 1.0 - 2.5 (calc)   Total Bilirubin 0.3 0.2 -  1.2 mg/dL   Alkaline phosphatase (APISO) 60 31 - 125 U/L   AST 50 (H) 10 - 30 U/L   ALT 30 (H) 6 - 29 U/L  TSH     Status: None   Collection Time: 04/04/21 12:00 AM  Result Value Ref Range    TSH 1.24 mIU/L    Comment:           Reference Range .           > or = 20 Years  0.40-4.50 .                Pregnancy Ranges           First trimester    0.26-2.66           Second trimester   0.55-2.73           Third trimester    0.43-2.91   Urinalysis with Culture Reflex     Status: None   Collection Time: 04/04/21 12:00 AM   Specimen: Urine  Result Value Ref Range   Color, Urine YELLOW YELLOW   APPearance CLEAR CLEAR   Specific Gravity, Urine 1.009 1.001 - 1.035   pH 7.5 5.0 - 8.0   Glucose, UA NEGATIVE NEGATIVE   Bilirubin Urine NEGATIVE NEGATIVE   Ketones, ur NEGATIVE NEGATIVE   Hgb urine dipstick NEGATIVE NEGATIVE   Protein, ur NEGATIVE NEGATIVE   Nitrites, Initial NEGATIVE NEGATIVE   Leukocyte Esterase NEGATIVE NEGATIVE   WBC, UA 0-5 0 - 5 /HPF   RBC / HPF NONE SEEN 0 - 2 /HPF   Squamous Epithelial / LPF 0-5 < OR = 5 /HPF   Bacteria, UA NONE SEEN NONE SEEN /HPF   Hyaline Cast NONE SEEN NONE SEEN /LPF   Note      Comment: This urine was analyzed for the presence of WBC,  RBC, bacteria, casts, and other formed elements.  Only those elements seen were reported. . .   Pregnancy, urine     Status: None   Collection Time: 04/04/21 12:00 AM  Result Value Ref Range   Preg Test, Ur NEGATIVE NEGATIVE  REFLEXIVE URINE CULTURE     Status: None   Collection Time: 04/04/21 12:00 AM  Result Value Ref Range   Reflexve Urine Culture      Comment: NO CULTURE INDICATED  Hemoglobin A1c     Status: None   Collection Time: 05/17/21 12:00 AM  Result Value Ref Range   Hemoglobin A1C 7.2     No results found.     All questions at time of visit were answered - patient instructed to contact office with any additional concerns or updates. ER/RTC precautions were reviewed with the patient as applicable.   Please note: manual typing as well as voice recognition software may have been used to produce this document - typos may escape review. Please contact Dr. Sheppard Coil for any  needed clarifications.

## 2021-06-25 ENCOUNTER — Other Ambulatory Visit: Payer: Self-pay | Admitting: Osteopathic Medicine

## 2021-07-17 ENCOUNTER — Ambulatory Visit (INDEPENDENT_AMBULATORY_CARE_PROVIDER_SITE_OTHER): Payer: Medicaid Other | Admitting: Osteopathic Medicine

## 2021-07-17 ENCOUNTER — Encounter: Payer: Self-pay | Admitting: Osteopathic Medicine

## 2021-07-17 VITALS — BP 116/72 | HR 102 | Temp 98.0°F | Ht 64.0 in | Wt 205.0 lb

## 2021-07-17 DIAGNOSIS — F32A Depression, unspecified: Secondary | ICD-10-CM | POA: Diagnosis not present

## 2021-07-17 DIAGNOSIS — G8929 Other chronic pain: Secondary | ICD-10-CM

## 2021-07-17 DIAGNOSIS — F419 Anxiety disorder, unspecified: Secondary | ICD-10-CM | POA: Diagnosis not present

## 2021-07-17 DIAGNOSIS — F902 Attention-deficit hyperactivity disorder, combined type: Secondary | ICD-10-CM | POA: Diagnosis not present

## 2021-07-17 DIAGNOSIS — M545 Low back pain, unspecified: Secondary | ICD-10-CM | POA: Diagnosis not present

## 2021-07-17 MED ORDER — LISDEXAMFETAMINE DIMESYLATE 40 MG PO CAPS: 40.0000 mg | ORAL_CAPSULE | ORAL | 0 refills | Status: DC

## 2021-07-17 MED ORDER — LISDEXAMFETAMINE DIMESYLATE 40 MG PO CAPS: 40.0000 mg | ORAL_CAPSULE | Freq: Every day | ORAL | 0 refills | Status: DC

## 2021-07-17 MED ORDER — PREGABALIN 100 MG PO CAPS: 100.0000 mg | ORAL_CAPSULE | Freq: Two times a day (BID) | ORAL | 1 refills | Status: DC

## 2021-07-17 NOTE — Progress Notes (Signed)
Rachael Fowler is a 35 y.o. female who presents to  [place] at [facility]  today, 07/17/21, seeking care for the following:  Follow up ADHD Rx, last visit we increased Vyvanse from 30 mg to 40 mg and increased Lexapro from 15 mg to 20 mg  - doing well, havnig insomnia if she takes meds to late in the day      ASSESSMENT & PLAN with other pertinent findings:  The primary encounter diagnosis was Attention deficit hyperactivity disorder (ADHD), combined type. A diagnosis of Anxiety and depression was also pertinent to this visit.    Refill medications Reviewed neuro notes, gave increased dose to last until her next appt w/ them 2  There are no Patient Instructions on file for this visit.  No orders of the defined types were placed in this encounter.   No orders of the defined types were placed in this encounter.    See below for relevant physical exam findings  See below for recent lab and imaging results reviewed  Medications, allergies, PMH, PSH, SocH, FamH reviewed below    Follow-up instructions: No follow-ups on file.                                        Exam:  BP 116/72   Pulse (!) 102   Temp 98 F (36.7 C)   Ht 5' 4"  (1.626 m)   Wt 205 lb (93 kg)   SpO2 99%   BMI 35.19 kg/m  Constitutional: VS see above. General Appearance: alert, well-developed, well-nourished, NAD Neck: No masses, trachea midline.  Respiratory: Normal respiratory effort. no wheeze, no rhonchi, no rales Cardiovascular: S1/S2 normal, no murmur, no rub/gallop auscultated. RRR.  Musculoskeletal: Gait normal. Symmetric and independent movement of all extremities Neurological: Normal balance/coordination. No tremor. Skin: warm, dry, intact.  Psychiatric: Normal judgment/insight. Normal mood and affect. Oriented x3.   Current Meds  Medication Sig   ACCU-CHEK GUIDE test strip    albuterol (VENTOLIN HFA) 108 (90  Base) MCG/ACT inhaler Inhale 2 puffs into the lungs every 6 (six) hours as needed for wheezing or shortness of breath.   aspirin 81 MG chewable tablet Chew 1 tablet by mouth daily.   Blood Glucose Monitoring Suppl (ACCU-CHEK AVIVA PLUS) w/Device KIT Accu-Chek Aviva Plus Meter  USE AS DIRECTED   Continuous Blood Gluc Receiver (DEXCOM G6 RECEIVER) DEVI DISPENSE AND USE AS DIRECTED   Continuous Blood Gluc Sensor (DEXCOM G6 SENSOR) MISC Apply topically.   Continuous Blood Gluc Transmit (DEXCOM G6 TRANSMITTER) MISC    diclofenac sodium (VOLTAREN) 1 % GEL Apply 4 g topically 4 (four) times daily. To affected joint.   Dulaglutide (TRULICITY) 0.88 PJ/0.3PR SOPN Inject 0.75 mg into the skin once a week.   Erenumab-aooe (AIMOVIG) 70 MG/ML SOAJ INJECT 70 MG INTO THE SKIN EVERY 30 (THIRTY) DAYS.   escitalopram (LEXAPRO) 20 MG tablet One half tab daily for a week then one tab by mouth daily   insulin detemir (LEVEMIR) 100 UNIT/ML injection Inject 70 Units into the skin.    Insulin Pen Needle (BD PEN NEEDLE NANO U/F) 32G X 4 MM MISC Use with levemir daily   Insulin Syringe-Needle U-100 31G X 15/64" 0.3 ML MISC by Does not apply route.   ipratropium (ATROVENT) 0.06 % nasal spray Place 2 sprays into both nostrils 4 (four) times daily. As needed for  runny nose / postnasal drip   levonorgestrel (MIRENA) 20 MCG/24HR IUD 1 each by Intrauterine route once.   lidocaine (XYLOCAINE) 2 % solution Use as directed 5-10 mLs in the mouth or throat every 3 (three) hours as needed.   lisdexamfetamine (VYVANSE) 40 MG capsule Take 1 capsule (40 mg total) by mouth daily.   lisinopril (ZESTRIL) 10 MG tablet Take 1 tablet by mouth daily.   loratadine (CLARITIN) 10 MG tablet TAKE 1 TABLET BY MOUTH EVERY DAY   meloxicam (MOBIC) 15 MG tablet TAKE 1 TABLET (15 MG TOTAL) BY MOUTH DAILY. AS NEEDED FOR ACHES/PAINS   omeprazole (PRILOSEC) 10 MG capsule TAKE 1 CAPSULE BY MOUTH EVERY DAY   ondansetron (ZOFRAN-ODT) 8 MG disintegrating  tablet Take 1 tablet (8 mg total) by mouth every 8 (eight) hours as needed for nausea.   pregabalin (LYRICA) 75 MG capsule Take 1 capsule (75 mg total) by mouth 2 (two) times daily.   QUEtiapine (SEROQUEL) 100 MG tablet Take 1 tablet (100 mg total) by mouth at bedtime.   SUMAtriptan (IMITREX) 50 MG tablet Take 0.5-1 tablets (25-50 mg total) by mouth every 2 (two) hours as needed for migraine. May repeat in 2 hours if headache persists or recurs.   traMADol (ULTRAM) 50 MG tablet Take 1-2 tablets (50-100 mg total) by mouth every 8 (eight) hours as needed for moderate pain. Maximum 6 tabs per day.    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   Asthma exacerbation 10/10/2020   Wrist pain, acute, right 05/30/2020   Mood disorder ([place]) 07/14/2019   Patellofemoral syndrome, bilateral 08/17/2018   Chronic bilateral low back pain without sciatica 08/17/2018   Pain of left heel 07/14/2018   Pain of right heel 07/14/2018   Leukocytosis 07/14/2018   Hirsutism 05/26/2018   Abnormal WBC count 05/26/2018   Psychophysiological insomnia 05/26/2018   Anxiety and depression 05/26/2018   Chronic tension-type headache, not intractable 05/26/2018   Moderate persistent asthma with exacerbation 02/05/2018   Type 2 diabetes mellitus without complication, without long-term current use of insulin ([place]) 08/19/2017   Pregnancy 08/19/2017   OTHER ACUTE REACTIONS TO STRESS 12/04/2009    Family History  Problem Relation Age of Onset   Depression  Mother    Diabetes Mother    Hyperlipidemia Mother    Hypertension Mother    Hyperlipidemia Father    Hypertension Father    Cancer Maternal Uncle    Diabetes Maternal Uncle    Cancer Maternal Grandmother    Hypertension Maternal Grandmother    Hyperlipidemia Maternal Grandmother    Hyperlipidemia Paternal Grandmother     Social History   Tobacco Use  Smoking Status Former   Packs/day: 0.50   Types: Cigarettes   Quit date: 01/22/2020   Years since quitting: 1.4  Smokeless Tobacco Never    Past Surgical History:  Procedure Laterality Date   CESAREAN SECTION     KNEE SURGERY  04/20/2006    Immunization History  Administered Date(s) Administered   DTP  07/17/1988, 10/02/1988, 11/27/1988, 11/12/1989   Hepatitis B 07/19/1999, 08/22/1999   HiB (PRP-OMP) 11/12/1989   Influenza Split 09/10/2017, 07/14/2018, 07/11/2019, 08/16/2020   Influenza, Seasonal, Injecte, Preservative Fre 09/10/2017   Influenza,inj,Quad PF,6+ Mos 09/10/2017, 07/14/2018, 06/19/2021   Influenza,inj,quad, With Preservative 07/11/2019, 08/16/2020   Influenza-Unspecified 09/10/2017, 07/11/2019   MMR 11/12/1989   OPV 07/17/1988, 10/02/1988, 11/12/1989   Td 04/18/2003   Tdap 01/21/2018    Recent Results (from the past 2160 hour(s))  Hemoglobin A1c     Status: None   Collection Time: 05/17/21 12:00 AM  Result Value Ref Range   Hemoglobin A1C 7.2     No results found.     All questions at time of visit were answered - patient instructed to contact office with any additional concerns or updates. ER/RTC precautions were reviewed with the patient as applicable.   Please note: manual typing as well as voice recognition software may have been used to produce this document - typos may escape review. Please contact Dr. Sheppard Coil for any needed clarifications.

## 2021-08-09 ENCOUNTER — Telehealth: Payer: Self-pay

## 2021-08-09 NOTE — Telephone Encounter (Signed)
Medication: Erenumab-aooe (AIMOVIG) 70 MG/ML SOAJ Prior authorization submitted via CoverMyMeds on 08/09/2021 [place] submission pending

## 2021-08-12 ENCOUNTER — Ambulatory Visit (INDEPENDENT_AMBULATORY_CARE_PROVIDER_SITE_OTHER): Payer: Medicaid Other | Admitting: Sports Medicine

## 2021-08-12 ENCOUNTER — Ambulatory Visit (INDEPENDENT_AMBULATORY_CARE_PROVIDER_SITE_OTHER): Payer: Medicaid Other

## 2021-08-12 ENCOUNTER — Other Ambulatory Visit: Payer: Self-pay

## 2021-08-12 DIAGNOSIS — M47816 Spondylosis without myelopathy or radiculopathy, lumbar region: Secondary | ICD-10-CM

## 2021-08-12 DIAGNOSIS — M545 Low back pain, unspecified: Secondary | ICD-10-CM

## 2021-08-12 MED ORDER — PREDNISONE 50 MG PO TABS: ORAL_TABLET | ORAL | 0 refills | Status: DC

## 2021-08-12 NOTE — Progress Notes (Addendum)
    Procedures performed today:    None.  Independent interpretation of notes and tests performed by another provider:   None.  Brief History, Exam, Impression, and Recommendations:    Lumbar spondylosis This is a 35 year old female, lumbar DDD, facet arthritis, we have treated her in the past back in 2019. She responded back to to a burst of prednisone.  Nerve conduction study at that time was negative. Today she is having recurrence of pain after a fall, pain is localized in the mid lumbar spine, nothing overtly radicular. Tenderness along the paralumbar musculature negative straight leg raise. We will treat this conservatively, 5 days of prednisone, lumbar spine x-rays, home conditioning in the form of core conditioning and McKenzie type exercises. Return to see me in 4 to 6 weeks, MRI for interventional planning if no better. Tramadol is not helping, allergic to hydrocodone, we will not be using any chronic opiates, we will work with injections first.    ___________________________________________ Ihor Austin. Benjamin Stain, M.D., ABFM., CAQSM. Primary Care and Sports Medicine [facility]  Adjunct Instructor of Family Medicine  [employer]

## 2021-08-12 NOTE — Assessment & Plan Note (Addendum)
This is a 36 year old female, lumbar DDD, facet arthritis, we have treated her in the past back in 2019. She responded back to to a burst of prednisone.  Nerve conduction study at that time was negative. Today she is having recurrence of pain after a fall, pain is localized in the mid lumbar spine, nothing overtly radicular. Tenderness along the paralumbar musculature negative straight leg raise. We will treat this conservatively, 5 days of prednisone, lumbar spine x-rays, home conditioning in the form of core conditioning and McKenzie type exercises. Return to see me in 4 to 6 weeks, MRI for interventional planning if no better. Tramadol is not helping, allergic to hydrocodone, we will not be using any chronic opiates, we will work with injections first.

## 2021-08-12 NOTE — Telephone Encounter (Signed)
Medication:  Erenumab-aooe (AIMOVIG) 70 MG/ML SOAJ Prior authorization determination received Medication has been approved Approval dates: 08/09/2021-08/09/2022  Patient aware via: MyChart Pharmacy aware: Yes Provider aware via this encounter

## 2021-08-19 ENCOUNTER — Other Ambulatory Visit: Payer: Self-pay | Admitting: Osteopathic Medicine

## 2021-08-22 ENCOUNTER — Encounter: Payer: Self-pay | Admitting: Physician Assistant

## 2021-08-22 ENCOUNTER — Other Ambulatory Visit: Payer: Self-pay

## 2021-08-22 ENCOUNTER — Ambulatory Visit (INDEPENDENT_AMBULATORY_CARE_PROVIDER_SITE_OTHER): Payer: Medicaid Other | Admitting: Physician Assistant

## 2021-08-22 VITALS — BP 124/73 | HR 96 | Temp 98.2°F | Ht 64.0 in | Wt 207.0 lb

## 2021-08-22 DIAGNOSIS — J4541 Moderate persistent asthma with (acute) exacerbation: Secondary | ICD-10-CM | POA: Diagnosis not present

## 2021-08-22 DIAGNOSIS — J45909 Unspecified asthma, uncomplicated: Secondary | ICD-10-CM | POA: Diagnosis not present

## 2021-08-22 MED ORDER — MONTELUKAST SODIUM 10 MG PO TABS: 10.0000 mg | ORAL_TABLET | Freq: Every day | ORAL | 3 refills | Status: DC

## 2021-08-22 MED ORDER — DOXYCYCLINE HYCLATE 100 MG PO TABS: 100.0000 mg | ORAL_TABLET | Freq: Two times a day (BID) | ORAL | 0 refills | Status: DC

## 2021-08-22 MED ORDER — PREDNISONE 20 MG PO TABS: ORAL_TABLET | ORAL | 0 refills | Status: DC

## 2021-08-22 NOTE — Progress Notes (Signed)
   Subjective:    Patient ID: Rachael Fowler, female    DOB: 30-May-1986, 35 y.o.   MRN: [id number]  HPI Pt is a 35 yo female with asthma who presents to the clinic with 2 weeks of cough, SOB, chest tightness. She does smoke cigars. She is using her rescue inhaler but not helping much. She is taking mucinex and delsym with little relief. No fever, chills, body aches. Her whole family has been sick. She was sick about 2 months ago and treated with zpak and prednisone. She just got better when she got sick again. Not tested for covid. She really feels like she has not been the same since covid. She tends to get sick more frequently.     Review of Systems See HPI.     Objective:   Physical Exam Vitals reviewed.  Constitutional:      Appearance: She is well-developed. She is obese.  HENT:     Head: Normocephalic.     Mouth/Throat:     Mouth: Mucous membranes are moist.     Pharynx: No oropharyngeal exudate.  Eyes:     Extraocular Movements: Extraocular movements intact.     Pupils: Pupils are equal, round, and reactive to light.  Cardiovascular:     Rate and Rhythm: Normal rate and regular rhythm.  Pulmonary:     Effort: No bradypnea or respiratory distress.     Breath sounds: Examination of the right-upper field reveals wheezing. Examination of the left-upper field reveals wheezing. Examination of the right-middle field reveals wheezing and rhonchi. Examination of the left-middle field reveals wheezing and rhonchi. Wheezing and rhonchi present.     Comments: Slightly labored.  Chest:     Chest wall: No mass or tenderness.  Musculoskeletal:     Cervical back: Normal range of motion.  Lymphadenopathy:     Cervical: No cervical adenopathy.  Neurological:     General: No focal deficit present.     Mental Status: She is alert.  Psychiatric:        Mood and Affect: Mood normal.          Assessment & Plan:  Marland KitchenMarland KitchenOlinda was seen today for cough and shortness of breath.  Diagnoses and  all orders for this visit:  Asthma with bronchitis -     doxycycline (VIBRA-TABS) 100 MG tablet; Take 1 tablet (100 mg total) by mouth 2 (two) times daily. -     predniSONE (DELTASONE) 20 MG tablet; Take 3 tablets for 3 days, take 2 tablets for 3 days, take 1 tablet for 3 days, take 1/2 tablet for 4 days.  Moderate persistent asthma with exacerbation -     predniSONE (DELTASONE) 20 MG tablet; Take 3 tablets for 3 days, take 2 tablets for 3 days, take 1 tablet for 3 days, take 1/2 tablet for 4 days. -     montelukast (SINGULAIR) 10 MG tablet; Take 1 tablet (10 mg total) by mouth at bedtime.  Pulse ox is 98 percent.  Vitals reassuring.  Wheezing on lung exam with productive cough.  Treated with doxycycline and prednisone.  Albuterol as needed for rescue.  Start singulair and .Marland KitchenVitamin D3 5000 IU (125 mcg) daily Vitamin C 500 mg twice daily Zinc 50 to 75 mg daily For prevention.  When feeling better consider spironmetry. May need a ICS inhaler daily added.  Encouraged smoking cessation.  Follow up as needed or if symptoms worsening.

## 2021-08-22 NOTE — Patient Instructions (Addendum)
..Vitamin D3 5000 IU (125 mcg) daily Vitamin C 500 mg twice daily Zinc 50 to 75 mg daily  Start singulair daily.  Doxy and prednisone sent.   Spironmetry.   Acute Bronchitis, Adult Acute bronchitis is when air tubes in the lungs (bronchi) suddenly get swollen. The condition can make it hard for you to breathe. In adults, acute bronchitis usually goes away within 2 weeks. A cough caused by bronchitis may last up to 3 weeks. Smoking, allergies, and asthma can make the condition worse. What are the causes? This condition is caused by: Cold and flu viruses. The most common cause of this condition is the virus that causes the common cold. Bacteria. Substances that irritate the lungs, including: Smoke from cigarettes and other types of tobacco. Dust and pollen. Fumes from chemicals, gases, or burned fuel. Other materials that pollute indoor or outdoor air. Close contact with someone who has acute bronchitis. What increases the risk? The following factors may make you more likely to develop this condition: A weak body's defense system. This is also called the immune system. Any condition that affects your lungs and breathing, such as asthma. What are the signs or symptoms? Symptoms of this condition include: A cough. Coughing up clear, yellow, or green mucus. Wheezing. Having too much mucus in your lungs (chest congestion). Shortness of breath. A fever. Chills. Body aches. A sore throat. How is this treated? Acute bronchitis may go away over time without treatment. Your doctor may recommend: Drinking more fluids. Using a device that gets medicine into your lungs (inhaler). Using a vaporizer or a humidifier. These are machines that add water or moisture to the air. This helps with coughing and poor breathing. Taking a medicine for fever. Taking a medicine that thins mucus and clears congestion. Taking a medicine that prevents or stops coughing. Follow these instructions at  home: Activity Get a lot of rest. Return to your normal activities as told by your doctor. Ask your doctor what activities are safe for you. Lifestyle  Drink enough fluid to keep your pee (urine) pale yellow. Do not drink alcohol. Do not use any products that contain nicotine or tobacco, such as cigarettes, e-cigarettes, and chewing tobacco. If you need help quitting, ask your doctor. Be aware that: Your bronchitis will get worse if you smoke or breathe in other people's smoke (secondhand smoke). Your lungs will heal faster if you quit smoking. General instructions Take over-the-counter and prescription medicines only as told by your doctor. Use an inhaler, cool mist vaporizer, or humidifier as told by your doctor. Rinse your mouth often with salt water. To make salt water, dissolve -1 tsp (3-6 g) of salt in 1 cup (237 mL) of warm water. Take two teaspoons of honey at bedtime. This helps lessen your coughing at night. Keep all follow-up visits as told by your doctor. This is important. How is this prevented? To lower your risk of getting this condition again: Wash your hands often with soap and water. If you cannot use soap and water, use hand sanitizer. Avoid contact with people who have cold symptoms. Try not to touch your mouth, nose, or eyes with your hands. Make sure to get the flu shot every year. Contact a doctor if: Your symptoms do not get better in 2 weeks. You vomit more than once or twice. You have symptoms of loss of fluid from your body (dehydration). These include: Dark pee. Dry skin or eyes. Increased thirst. Headaches. Confusion. Muscle cramps. Get help right  away if: You cough up blood. You have chest pain. You have very bad shortness of breath. You become dehydrated. You faint or keep feeling like you are going to faint. You have a very bad headache. Your fever or chills get worse. These symptoms may be an emergency. Get help right away. Call your local  emergency services (911 in the [place] Do not wait to see if the symptoms will go away. Do not drive yourself to the hospital. Summary Acute bronchitis is when air tubes in the lungs (bronchi) suddenly get swollen. In adults, acute bronchitis usually goes away within 2 weeks. Take over-the-counter and prescription medicines only as told by your doctor. Drink enough fluid to keep your pee (urine) pale yellow. Contact a doctor if your symptoms do not improve after 2 weeks of treatment. Get help right away if you cough up blood, faint, or have chest pain or shortness of breath. This information is not intended to replace advice given to you by your health care provider. Make sure you discuss any questions you have with your health care provider. Document Revised: 09/12/2020 Document Reviewed: 05/06/2019 Elsevier Patient Education  2022 [employer].

## 2021-08-28 LAB — HEMOGLOBIN A1C: Hemoglobin A1C: 6.8

## 2021-09-06 ENCOUNTER — Telehealth: Payer: Self-pay

## 2021-09-06 NOTE — Telephone Encounter (Signed)
Medication: QUEtiapine (SEROQUEL) 100 MG tablet Prior authorization submitted via CoverMyMeds on 09/06/2021 PA submission pending

## 2021-09-23 ENCOUNTER — Other Ambulatory Visit: Payer: Self-pay

## 2021-09-23 ENCOUNTER — Ambulatory Visit (INDEPENDENT_AMBULATORY_CARE_PROVIDER_SITE_OTHER): Payer: Medicaid Other | Admitting: Sports Medicine

## 2021-09-23 DIAGNOSIS — M47816 Spondylosis without myelopathy or radiculopathy, lumbar region: Secondary | ICD-10-CM

## 2021-09-23 NOTE — Progress Notes (Addendum)
    Procedures performed today:    None.  Independent interpretation of notes and tests performed by another provider:   None.  Brief History, Exam, Impression, and Recommendations:    Lumbar spondylosis This is a pleasant 35 year old female, she has known lumbar DDD and mild facet arthritis, with a normal nerve conduction/EMG, she had a recurrence of pain at the last visit we did a burst of prednisone. I also had her do home conditioning/physical therapy in the form McKenzie exercises/core conditioning. Currently on Lyrica 100 mg daily with directions to take it twice a day. Other over-the-counter analgesics such as sildenafil have not been effective. NSAID such as meloxicam have not been effective. She still has some discomfort, axial, no better after greater than 6 weeks of physician directed conservative treatments (including core conditioning and McKenzie exercises which she has done daily) we will proceed with MRI for epidural planning. We also have an option to go up on her Lyrica. Return to see me for MRI results.    ___________________________________________ Ihor Austin. Benjamin Stain, M.D., ABFM., CAQSM. Primary Care and Sports Medicine [facility]  Adjunct Instructor of Family Medicine  [employer]

## 2021-09-23 NOTE — Assessment & Plan Note (Addendum)
This is a pleasant 35 year old female, she has known lumbar DDD and mild facet arthritis, with a normal nerve conduction/EMG, she had a recurrence of pain at the last visit we did a burst of prednisone. I also had her do home conditioning/physical therapy in the form McKenzie exercises/core conditioning. Currently on Lyrica 100 mg daily with directions to take it twice a day. Other over-the-counter analgesics such as sildenafil have not been effective. NSAID such as meloxicam have not been effective. She still has some discomfort, axial, no better after greater than 6 weeks of physician directed conservative treatments (including core conditioning and McKenzie exercises which she has done daily) we will proceed with MRI for epidural planning. We also have an option to go up on her Lyrica. Return to see me for MRI results.

## 2021-09-26 NOTE — Telephone Encounter (Signed)
Medication: QUEtiapine (SEROQUEL) 100 MG tablet Prior authorization determination received Medication has been approved Approval dates: 09/04/2021-10/26/2021  Patient aware via: MyChart Pharmacy aware: Yes Provider aware via this encounter

## 2021-09-29 ENCOUNTER — Other Ambulatory Visit: Payer: Self-pay | Admitting: Osteopathic Medicine

## 2021-09-29 DIAGNOSIS — M545 Low back pain, unspecified: Secondary | ICD-10-CM

## 2021-09-30 NOTE — Telephone Encounter (Signed)
Routing to covering provider.  °

## 2021-10-17 ENCOUNTER — Encounter: Payer: Self-pay | Admitting: Family Medicine

## 2021-10-17 ENCOUNTER — Other Ambulatory Visit: Payer: Self-pay

## 2021-10-17 ENCOUNTER — Ambulatory Visit (INDEPENDENT_AMBULATORY_CARE_PROVIDER_SITE_OTHER): Payer: Medicaid Other | Admitting: Family Medicine

## 2021-10-17 VITALS — BP 120/83 | HR 94 | Ht 64.0 in | Wt 205.0 lb

## 2021-10-17 DIAGNOSIS — G43809 Other migraine, not intractable, without status migrainosus: Secondary | ICD-10-CM

## 2021-10-17 DIAGNOSIS — G629 Polyneuropathy, unspecified: Secondary | ICD-10-CM

## 2021-10-17 DIAGNOSIS — F902 Attention-deficit hyperactivity disorder, combined type: Secondary | ICD-10-CM | POA: Insufficient documentation

## 2021-10-17 DIAGNOSIS — E119 Type 2 diabetes mellitus without complications: Secondary | ICD-10-CM

## 2021-10-17 DIAGNOSIS — I10 Essential (primary) hypertension: Secondary | ICD-10-CM

## 2021-10-17 HISTORY — DX: Polyneuropathy, unspecified: G62.9

## 2021-10-17 HISTORY — DX: Attention-deficit hyperactivity disorder, combined type: F90.2

## 2021-10-17 MED ORDER — AIMOVIG 70 MG/ML ~~LOC~~ SOAJ: 70.0000 mg | SUBCUTANEOUS | 3 refills | Status: DC

## 2021-10-17 MED ORDER — QUETIAPINE FUMARATE 50 MG PO TABS: 50.0000 mg | ORAL_TABLET | Freq: Every day | ORAL | 1 refills | Status: DC

## 2021-10-17 MED ORDER — SUMATRIPTAN SUCCINATE 50 MG PO TABS: 25.0000 mg | ORAL_TABLET | ORAL | 3 refills | Status: DC | PRN

## 2021-10-17 MED ORDER — AMPHETAMINE-DEXTROAMPHETAMINE 15 MG PO TABS: 15.0000 mg | ORAL_TABLET | Freq: Two times a day (BID) | ORAL | 0 refills | Status: DC

## 2021-10-17 NOTE — Assessment & Plan Note (Signed)
Diagnosed by neurology recently they had recommended that she continue with the Lyrica.  But she almost feels like it makes her symptoms in her feet worse so we discussed tapering off of the Lyrica and staying off for about a month to see if she really notices a major difference.  If she feels like the Lyrica actually was being more helpful then we can always restart it that is no problem.

## 2021-10-17 NOTE — Assessment & Plan Note (Signed)
Pressure looks fantastic today. 

## 2021-10-17 NOTE — Assessment & Plan Note (Signed)
Follows with endocrinology ?

## 2021-10-17 NOTE — Assessment & Plan Note (Signed)
We discussed options.  She would like to try something short acting if possible we discussed that certainly an option we will try for 30 days if not helpful could consider switching back to Vyvanse but doing a higher dose.  Follow-up in 1 month after change or she can see me a MyChart note if she wants to either continue or switch back.  Otherwise I will see her back in 3 months.

## 2021-10-17 NOTE — Assessment & Plan Note (Signed)
Is actually doing really well on her Aimovig and very happy with her regimen.  She is due for refills on that and her Imitrex today.  Prescription sent to pharmacy.

## 2021-10-17 NOTE — Progress Notes (Signed)
Established Patient Office Visit  Subjective:  Patient ID: Rachael Fowler, female    DOB: 17-Apr-1986  Age: 35 y.o. MRN: [id number]  CC: No chief complaint on file.   HPI Rachael Fowler presents   To switch care to me today.  Her prior PCP in our office is no longer here so I am taking over her care.  I do take care of of her mom and have for years.  Hx of DM followed by Endocrinology for her DM.  She has a continuous glucose meter and is currently on Levemir and metformin.  He is also on an ACE inhibitor.  Takes Seroquel 22m QD.    She has been diagnosed with ADHD and was started on Vyvanse starting with 30 mg about 4 months ago after Monday when she was increased to 40 mg she says initially she felt like it was helpful but it seems a little bit less consistent.  And she says it takes almost for 5 hours to feel like it is actually kicking in she usually takes it between 7 AM and 9 AM most days and says she is very consistent with taking it first thing after she gets up.  She does not feel like it is affected her blood pressure or heart rate or sleep quality.  Was also recently evaluated by neurology for small fiber neuropathy and they recommended that she continue Lyrica.  She says that a lot of her symptoms particularly in her feet feeling like they are super tender and sensitive actually got worse after she started the Lyrica.  And she noticed it was not as much of a problem when she was only using Lyrica once a day.  For her asthma she did try Singulair recently but says that it really affected her mood in a negative way and so stopped it.  Past Medical History:  Diagnosis Date   Asthma    Diabetes mellitus without complication ([place]    Hypertension    Mental disorder     Past Surgical History:  Procedure Laterality Date   CESAREAN SECTION     KNEE SURGERY  04/20/2006    Family History  Problem Relation Age of Onset   Depression Mother    Diabetes Mother    Hyperlipidemia  Mother    Hypertension Mother    Hyperlipidemia Father    Hypertension Father    Cancer Maternal Uncle    Diabetes Maternal Uncle    Cancer Maternal Grandmother    Hypertension Maternal Grandmother    Hyperlipidemia Maternal Grandmother    Hyperlipidemia Paternal Grandmother     Social History   Socioeconomic History   Marital status: Single    Spouse name: Not on file   Number of children: Not on file   Years of education: Not on file   Highest education level: Not on file  Occupational History   Not on file  Tobacco Use   Smoking status: Former    Packs/day: 0.50    Types: Cigarettes    Quit date: 01/22/2020    Years since quitting: 1.7   Smokeless tobacco: Never  Vaping Use   Vaping Use: Never used  Substance and Sexual Activity   Alcohol use: Not Currently    Comment: rarely   Drug use: Never   Sexual activity: Yes    Birth control/protection: I.U.D.  Other Topics Concern   Not on file  Social History Narrative   Not on file   Social Determinants of Health  Financial Resource Strain: Not on file  Food Insecurity: Not on file  Transportation Needs: Not on file  Physical Activity: Not on file  Stress: Not on file  Social Connections: Not on file  Intimate Partner Violence: Not on file    Outpatient Medications Prior to Visit  Medication Sig Dispense Refill   ACCU-CHEK GUIDE test strip      albuterol (VENTOLIN HFA) 108 (90 Base) MCG/ACT inhaler Inhale 2 puffs into the lungs every 6 (six) hours as needed for wheezing or shortness of breath. 8 g 0   Blood Glucose Monitoring Suppl (ACCU-CHEK AVIVA PLUS) w/Device KIT Accu-Chek Aviva Plus Meter  USE AS DIRECTED     Continuous Blood Gluc Receiver (DEXCOM G6 RECEIVER) DEVI DISPENSE AND USE AS DIRECTED     Continuous Blood Gluc Sensor (DEXCOM G6 SENSOR) MISC Apply topically.     Continuous Blood Gluc Transmit (DEXCOM G6 TRANSMITTER) MISC      diclofenac sodium (VOLTAREN) 1 % GEL Apply 4 g topically 4 (four)  times daily. To affected joint. 100 g 11   insulin detemir (LEVEMIR) 100 UNIT/ML injection Inject 60 Units into the skin.     Insulin Pen Needle (BD PEN NEEDLE NANO U/F) 32G X 4 MM MISC Use with levemir daily     Insulin Syringe-Needle U-100 31G X 15/64" 0.3 ML MISC by Does not apply route.     ipratropium (ATROVENT) 0.06 % nasal spray Place 2 sprays into both nostrils 4 (four) times daily. As needed for runny nose / postnasal drip 15 mL 1   levonorgestrel (MIRENA) 20 MCG/24HR IUD 1 each by Intrauterine route once.     lisinopril (ZESTRIL) 10 MG tablet Take 1 tablet by mouth daily.     loratadine (CLARITIN) 10 MG tablet TAKE 1 TABLET BY MOUTH EVERY DAY 30 tablet 17   omeprazole (PRILOSEC) 10 MG capsule Take 1 capsule (10 mg total) by mouth daily. NEEDS TO ESTABLISH WITH NEW PCP FOR FURTHER REFILLS 90 capsule 0   pregabalin (LYRICA) 100 MG capsule Take 1 capsule (100 mg total) by mouth 2 (two) times daily. NEEDS TO ESTABLISH WITH NEW PCP FOR FURTHER REFILLS 60 capsule 1   Erenumab-aooe (AIMOVIG) 70 MG/ML SOAJ INJECT 70 MG INTO THE SKIN EVERY 30 (THIRTY) DAYS. 1 mL 3   SUMAtriptan (IMITREX) 50 MG tablet Take 0.5-1 tablets (25-50 mg total) by mouth every 2 (two) hours as needed for migraine. May repeat in 2 hours if headache persists or recurs. 10 tablet 0   Dulaglutide (TRULICITY) 4.03 KV/4.2VZ SOPN Inject 0.75 mg into the skin once a week. 4 pen 11   escitalopram (LEXAPRO) 20 MG tablet TAKE 1/2 TABLET DAILY FOR A WEEK THEN ONE TABLET BY MOUTH DAILY (Patient taking differently: Take 20 mg by mouth daily.) 90 tablet 1   meloxicam (MOBIC) 15 MG tablet Take 1 tablet (15 mg total) by mouth daily. As needed for aches/pains. NEEDS TO ESTABLISH WITH NEW PCP FOR FURTHER REFILLS (Patient taking differently: Take 15 mg by mouth daily.) 90 tablet 0   metFORMIN (GLUCOPHAGE XR) 500 MG 24 hr tablet Take 2 tablets (1,000 mg total) by mouth daily with breakfast. 180 tablet 1   doxycycline (VIBRA-TABS) 100 MG tablet  Take 1 tablet (100 mg total) by mouth 2 (two) times daily. 20 tablet 0   lidocaine (XYLOCAINE) 2 % solution Use as directed 5-10 mLs in the mouth or throat every 3 (three) hours as needed. 100 mL 1   lisdexamfetamine (VYVANSE) 40 MG  capsule Take 1 capsule (40 mg total) by mouth daily. 30 capsule 0   lisdexamfetamine (VYVANSE) 40 MG capsule Take 1 capsule (40 mg total) by mouth every morning. 30 capsule 0   lisdexamfetamine (VYVANSE) 40 MG capsule Take 1 capsule (40 mg total) by mouth every morning. 30 capsule 0   montelukast (SINGULAIR) 10 MG tablet Take 1 tablet (10 mg total) by mouth at bedtime. 90 tablet 3   ondansetron (ZOFRAN-ODT) 8 MG disintegrating tablet Take 1 tablet (8 mg total) by mouth every 8 (eight) hours as needed for nausea. 30 tablet 1   predniSONE (DELTASONE) 20 MG tablet Take 3 tablets for 3 days, take 2 tablets for 3 days, take 1 tablet for 3 days, take 1/2 tablet for 4 days. 20 tablet 0   QUEtiapine (SEROQUEL) 100 MG tablet Take 1 tablet (100 mg total) by mouth at bedtime. (Patient taking differently: Take 50 mg by mouth at bedtime.) 90 tablet 3   No facility-administered medications prior to visit.    Allergies  Allergen Reactions   Gabapentin Hives and Itching   Hydrocodone Itching, Nausea And Vomiting and Other (See Comments)    Also nausea and vomiting    Penicillins Itching, Nausea And Vomiting and Other (See Comments)   Aripiprazole     Very restless, extreme bilateral arm pain   Penicillin G Itching and Nausea And Vomiting   Singulair [Montelukast] Other (See Comments)    Affected her mood   Hydrocodone-Acetaminophen Itching and Nausea And Vomiting    ROS Review of Systems    Objective:    Physical Exam  BP 120/83    Pulse 94    Ht 5' 4"  (1.626 m)    Wt 205 lb (93 kg)    SpO2 99%    BMI 35.19 kg/m  Wt Readings from Last 3 Encounters:  10/17/21 205 lb (93 kg)  08/22/21 207 lb (93.9 kg)  07/17/21 205 lb (93 kg)     Health Maintenance Due  Topic  Date Due   COVID-19 Vaccine (1) Never done   Pneumococcal Vaccine 55-51 Years old (1 - PCV) Never done   OPHTHALMOLOGY EXAM  Never done   HIV Screening  Never done   Hepatitis C Screening  Never done   FOOT EXAM  11/13/2019    There are no preventive care reminders to display for this patient.  Lab Results  Component Value Date   TSH 1.24 04/04/2021   Lab Results  Component Value Date   WBC 12.8 (H) 04/04/2021   HGB 12.9 04/04/2021   HCT 39.0 04/04/2021   MCV 87.2 04/04/2021   PLT 414 (H) 04/04/2021   Lab Results  Component Value Date   NA 139 04/04/2021   K 4.2 04/04/2021   CO2 31 04/04/2021   GLUCOSE 89 04/04/2021   BUN 9 04/04/2021   CREATININE 0.64 04/04/2021   BILITOT 0.3 04/04/2021   AST 50 (H) 04/04/2021   ALT 30 (H) 04/04/2021   PROT 6.4 04/04/2021   CALCIUM 10.0 04/04/2021   Lab Results  Component Value Date   CHOL 214 (H) 07/14/2018   Lab Results  Component Value Date   HDL 47 (L) 07/14/2018   Lab Results  Component Value Date   LDLCALC 119 (H) 07/14/2018   Lab Results  Component Value Date   TRIG 337 (H) 07/14/2018   Lab Results  Component Value Date   CHOLHDL 4.6 07/14/2018   Lab Results  Component Value Date   HGBA1C 7.2  05/17/2021      Assessment & Plan:   Problem List Items Addressed This Visit       Cardiovascular and Mediastinum   Migraines    Is actually doing really well on her Aimovig and very happy with her regimen.  She is due for refills on that and her Imitrex today.  Prescription sent to pharmacy.      Relevant Medications   SUMAtriptan (IMITREX) 50 MG tablet   Erenumab-aooe (AIMOVIG) 70 MG/ML SOAJ   High blood pressure    Pressure looks fantastic today.        Endocrine   Type 2 diabetes mellitus without complication, without long-term current use of insulin ([place])    Follows with endocrinology.        Nervous and Auditory   Small fiber neuropathy    Diagnosed by neurology recently they had recommended  that she continue with the Lyrica.  But she almost feels like it makes her symptoms in her feet worse so we discussed tapering off of the Lyrica and staying off for about a month to see if she really notices a major difference.  If she feels like the Lyrica actually was being more helpful then we can always restart it that is no problem.      Relevant Medications   SUMAtriptan (IMITREX) 50 MG tablet   amphetamine-dextroamphetamine (ADDERALL) 15 MG tablet   QUEtiapine (SEROQUEL) 50 MG tablet     Other   Attention deficit hyperactivity disorder (ADHD), combined type - Primary    We discussed options.  She would like to try something short acting if possible we discussed that certainly an option we will try for 30 days if not helpful could consider switching back to Vyvanse but doing a higher dose.  Follow-up in 1 month after change or she can see me a MyChart note if she wants to either continue or switch back.  Otherwise I will see her back in 3 months.      Relevant Medications   amphetamine-dextroamphetamine (ADDERALL) 15 MG tablet    Meds ordered this encounter  Medications   SUMAtriptan (IMITREX) 50 MG tablet    Sig: Take 0.5-1 tablets (25-50 mg total) by mouth every 2 (two) hours as needed for migraine. May repeat in 2 hours if headache persists or recurs.    Dispense:  10 tablet    Refill:  3   Erenumab-aooe (AIMOVIG) 70 MG/ML SOAJ    Sig: Inject 70 mg into the skin every 30 (thirty) days.    Dispense:  1 mL    Refill:  3   amphetamine-dextroamphetamine (ADDERALL) 15 MG tablet    Sig: Take 1 tablet by mouth 2 (two) times daily.    Dispense:  60 tablet    Refill:  0   QUEtiapine (SEROQUEL) 50 MG tablet    Sig: Take 1 tablet (50 mg total) by mouth at bedtime.    Dispense:  90 tablet    Refill:  1    Pls d/c all prior seroquel rx for 17m dose.    Follow-up: Return in about 3 months (around 01/15/2022) for ADHD.   I spent 35 minutes on the day of the encounter to include  pre-visit record review, face-to-face time with the patient and post visit ordering of test.   CBeatrice Lecher MD

## 2021-10-18 ENCOUNTER — Telehealth: Payer: Self-pay | Admitting: *Deleted

## 2021-10-18 NOTE — Telephone Encounter (Signed)
Okay I completed the peer to peer, the doctor told me she had no clinical documents uploaded so to please fax my 2 office notes from 10/17 and 11/28 which have been updated to reflect everything they will need, as well as x-ray results to:  Fax: [phone] Attn: NIA Be sure to include tracking #[phone].  Thank you!

## 2021-10-18 NOTE — Telephone Encounter (Signed)
The company in charge of authorizing her MRI left vm stating that your notes do not meet medical necessity.  They are requesting additional notes or for you to call before they make an official denial.  The number is [phone] and the tracking number is [id number].

## 2021-10-22 NOTE — Telephone Encounter (Signed)
I figured you had done it already!  Theyre just turrable so maybe this is the only way to get it done is fax again.  Womp.   Thank Patent attorney.

## 2021-10-22 NOTE — Telephone Encounter (Signed)
I don't have an acct set up with [facility] so I always fax records.  I've already faxed them your notes and x-ray results but I will fax them again.

## 2021-10-31 ENCOUNTER — Ambulatory Visit: Payer: Medicaid Other | Admitting: Obstetrics & Gynecology

## 2021-11-05 ENCOUNTER — Encounter: Payer: Self-pay | Admitting: Family Medicine

## 2021-11-05 ENCOUNTER — Telehealth (INDEPENDENT_AMBULATORY_CARE_PROVIDER_SITE_OTHER): Payer: Medicaid Other | Admitting: Family Medicine

## 2021-11-05 DIAGNOSIS — F902 Attention-deficit hyperactivity disorder, combined type: Secondary | ICD-10-CM | POA: Diagnosis not present

## 2021-11-05 DIAGNOSIS — T50905A Adverse effect of unspecified drugs, medicaments and biological substances, initial encounter: Secondary | ICD-10-CM

## 2021-11-05 DIAGNOSIS — E119 Type 2 diabetes mellitus without complications: Secondary | ICD-10-CM | POA: Diagnosis not present

## 2021-11-05 MED ORDER — LISDEXAMFETAMINE DIMESYLATE 50 MG PO CAPS: 50.0000 mg | ORAL_CAPSULE | Freq: Every day | ORAL | 0 refills | Status: DC

## 2021-11-05 NOTE — Progress Notes (Signed)
Acute Office Visit  Subjective:    Patient ID: Rachael Fowler, female    DOB: 02-12-1986, 36 y.o.   MRN: [id number]  No chief complaint on file.   HPI Patient is in today for f/u ADHD. We changed her from Vyvanse to the Adderall short acting.    She felt very nauseated on the Adderral and not wanting to eat so then her blood sugar were dropping and had a hypoglycemic event. Then had a 2nd event.  + HA, + diarrhea. Hand and arms going to sleep more easily. Had some muscle stiffness and cramps. Also had some stomach pain.  BP was OK.  Stopped the medication and hasn't taken since Sunday.  She lost 12 lbs.    Started school yesterday.  It was pretty stressful but feels like she is going to be able to get back into a good routine.  She says she rather stay up late doing homework if she had to than feel as bad as she did on the Adderall.  She came of the Lyrica completely about a week. Feels like it has worsened her arm numbness and tingling.  She has been tapering off over the last month.  Past Medical History:  Diagnosis Date   Asthma    Diabetes mellitus without complication ([place])    Hypertension    Mental disorder     Past Surgical History:  Procedure Laterality Date   CESAREAN SECTION     KNEE SURGERY  04/20/2006    Family History  Problem Relation Age of Onset   Depression Mother    Diabetes Mother    Hyperlipidemia Mother    Hypertension Mother    Hyperlipidemia Father    Hypertension Father    Cancer Maternal Uncle    Diabetes Maternal Uncle    Cancer Maternal Grandmother    Hypertension Maternal Grandmother    Hyperlipidemia Maternal Grandmother    Hyperlipidemia Paternal Grandmother     Social History   Socioeconomic History   Marital status: Single    Spouse name: Not on file   Number of children: Not on file   Years of education: Not on file   Highest education level: Not on file  Occupational History   Not on file  Tobacco Use   Smoking status: Former     Packs/day: 0.50    Types: Cigarettes    Quit date: 01/22/2020    Years since quitting: 1.7   Smokeless tobacco: Never  Vaping Use   Vaping Use: Never used  Substance and Sexual Activity   Alcohol use: Not Currently    Comment: rarely   Drug use: Never   Sexual activity: Yes    Birth control/protection: I.U.D.  Other Topics Concern   Not on file  Social History Narrative   Not on file   Social Determinants of Health   Financial Resource Strain: Not on file  Food Insecurity: Not on file  Transportation Needs: Not on file  Physical Activity: Not on file  Stress: Not on file  Social Connections: Not on file  Intimate Partner Violence: Not on file    Outpatient Medications Prior to Visit  Medication Sig Dispense Refill   ACCU-CHEK GUIDE test strip      albuterol (VENTOLIN HFA) 108 (90 Base) MCG/ACT inhaler Inhale 2 puffs into the lungs every 6 (six) hours as needed for wheezing or shortness of breath. 8 g 0   Blood Glucose Monitoring Suppl (ACCU-CHEK AVIVA PLUS) w/Device KIT Accu-Chek Aviva  Meter ° USE AS DIRECTED    ° Continuous Blood Gluc Receiver (DEXCOM G6 RECEIVER) DEVI DISPENSE AND USE AS DIRECTED    ° Continuous Blood Gluc Sensor (DEXCOM G6 SENSOR) MISC Apply topically.    ° Continuous Blood Gluc Transmit (DEXCOM G6 TRANSMITTER) MISC     ° diclofenac sodium (VOLTAREN) 1 % GEL Apply 4 g topically 4 (four) times daily. To affected joint. 100 g 11  ° Dulaglutide (TRULICITY) 0.75 MG/0.5ML SOPN Inject 0.75 mg into the skin once a week. 4 pen 11  ° Erenumab-aooe (AIMOVIG) 70 MG/ML SOAJ Inject 70 mg into the skin every 30 (thirty) days. 1 mL 3  ° escitalopram (LEXAPRO) 20 MG tablet TAKE 1/2 TABLET DAILY FOR A WEEK THEN ONE TABLET BY MOUTH DAILY (Patient taking differently: Take 20 mg by mouth daily.) 90 tablet 1  ° insulin detemir (LEVEMIR) 100 UNIT/ML injection Inject 60 Units into the skin.    ° Insulin Pen Needle (BD PEN NEEDLE NANO U/F) 32G X 4 MM MISC Use with levemir daily     ° Insulin Syringe-Needle U-100 31G X 15/64" 0.3 ML MISC by Does not apply route.    ° ipratropium (ATROVENT) 0.06 % nasal spray Place 2 sprays into both nostrils 4 (four) times daily. As needed for runny nose / postnasal drip 15 mL 1  ° levonorgestrel (MIRENA) 20 MCG/24HR IUD 1 each by Intrauterine route once.    ° lisinopril (ZESTRIL) 10 MG tablet Take 1 tablet by mouth daily.    ° loratadine (CLARITIN) 10 MG tablet TAKE 1 TABLET BY MOUTH EVERY DAY 30 tablet 17  ° meloxicam (MOBIC) 15 MG tablet Take 1 tablet (15 mg total) by mouth daily. As needed for aches/pains. NEEDS TO ESTABLISH WITH NEW PCP FOR FURTHER REFILLS (Patient taking differently: Take 15 mg by mouth daily.) 90 tablet 0  ° metFORMIN (GLUCOPHAGE XR) 500 MG 24 hr tablet Take 2 tablets (1,000 mg total) by mouth daily with breakfast. 180 tablet 1  ° omeprazole (PRILOSEC) 10 MG capsule Take 1 capsule (10 mg total) by mouth daily. NEEDS TO ESTABLISH WITH NEW PCP FOR FURTHER REFILLS 90 capsule 0  ° pregabalin (LYRICA) 100 MG capsule Take 1 capsule (100 mg total) by mouth 2 (two) times daily. NEEDS TO ESTABLISH WITH NEW PCP FOR FURTHER REFILLS 60 capsule 1  ° QUEtiapine (SEROQUEL) 50 MG tablet Take 1 tablet (50 mg total) by mouth at bedtime. 90 tablet 1  ° SUMAtriptan (IMITREX) 50 MG tablet Take 0.5-1 tablets (25-50 mg total) by mouth every 2 (two) hours as needed for migraine. May repeat in 2 hours if headache persists or recurs. 10 tablet 3  ° amphetamine-dextroamphetamine (ADDERALL) 15 MG tablet Take 1 tablet by mouth 2 (two) times daily. 60 tablet 0  ° °No facility-administered medications prior to visit.  ° ° °Allergies  °Allergen Reactions  ° Gabapentin Hives and Itching  ° Hydrocodone Itching, Nausea And Vomiting and Other (See Comments)  °  Also nausea and vomiting °  ° Penicillins Itching, Nausea And Vomiting and Other (See Comments)  ° Adderall [Amphetamine-Dextroamphetamine] Other (See Comments)  °  Nausea, anorexia, diarrhea, severe weight loss.   ° Aripiprazole   °  Very restless, extreme bilateral arm pain  ° Penicillin G Itching and Nausea And Vomiting  ° Singulair [Montelukast] Other (See Comments)  °  Affected her mood  ° Hydrocodone-Acetaminophen Itching and Nausea And Vomiting  ° ° °Review of Systems ° °   °Objective:  °  °  Physical Exam °Vitals and nursing note reviewed.  °Constitutional:   °   Appearance: She is well-developed.  °HENT:  °   Head: Normocephalic and atraumatic.  °Cardiovascular:  °   Rate and Rhythm: Normal rate and regular rhythm.  °   Heart sounds: Normal heart sounds.  °Pulmonary:  °   Effort: Pulmonary effort is normal.  °   Breath sounds: Normal breath sounds.  °Skin: °   General: Skin is warm and dry.  °Neurological:  °   Mental Status: She is alert and oriented to person, place, and time.  °Psychiatric:     °   Behavior: Behavior normal.  ° ° °There were no vitals taken for this visit. °Wt Readings from Last 3 Encounters:  °10/17/21 205 lb (93 kg)  °08/22/21 207 lb (93.9 kg)  °07/17/21 205 lb (93 kg)  ° ° °Health Maintenance Due  °Topic Date Due  ° COVID-19 Vaccine (1) Never done  ° Pneumococcal Vaccine 19-64 Years old (1 - PCV) Never done  ° OPHTHALMOLOGY EXAM  Never done  ° HIV Screening  Never done  ° Hepatitis C Screening  Never done  ° FOOT EXAM  11/13/2019  ° ° °There are no preventive care reminders to display for this patient. ° ° °Lab Results  °Component Value Date  ° TSH 1.24 04/04/2021  ° °Lab Results  °Component Value Date  ° WBC 12.8 (H) 04/04/2021  ° HGB 12.9 04/04/2021  ° HCT 39.0 04/04/2021  ° MCV 87.2 04/04/2021  ° PLT 414 (H) 04/04/2021  ° °Lab Results  °Component Value Date  ° NA 139 04/04/2021  ° K 4.2 04/04/2021  ° CO2 31 04/04/2021  ° GLUCOSE 89 04/04/2021  ° BUN 9 04/04/2021  ° CREATININE 0.64 04/04/2021  ° BILITOT 0.3 04/04/2021  ° AST 50 (H) 04/04/2021  ° ALT 30 (H) 04/04/2021  ° PROT 6.4 04/04/2021  ° CALCIUM 10.0 04/04/2021  ° °Lab Results  °Component Value Date  ° CHOL 214 (H) 07/14/2018  ° °Lab  Results  °Component Value Date  ° HDL 47 (L) 07/14/2018  ° °Lab Results  °Component Value Date  ° LDLCALC 119 (H) 07/14/2018  ° °Lab Results  °Component Value Date  ° TRIG 337 (H) 07/14/2018  ° °Lab Results  °Component Value Date  ° CHOLHDL 4.6 07/14/2018  ° °Lab Results  °Component Value Date  ° HGBA1C 7.2 05/17/2021  ° ° °   °Assessment & Plan:  ° °Problem List Items Addressed This Visit   ° °  ° Endocrine  ° Type 2 diabetes mellitus without complication, without long-term current use of insulin (HCC)  °  Is been having a couple hypoglycemic episodes secondary to poor appetite on the Adderall.  Now that she is off of it hopefully that should improve.  But just encouraged her to continue to work on healthy food choices and not overeat now that her appetite is rebounding. °  °  °  ° Other  ° Attention deficit hyperactivity disorder (ADHD), combined type - Primary  °  Adderall cause nausea, sig weight loss, HA and diarrhea.   °  °  ° °Other Visit Diagnoses   ° ° Medication side effect, initial encounter      ° °  °Medication added to intolerance list. ° °Meds ordered this encounter  °Medications  ° lisdexamfetamine (VYVANSE) 50 MG capsule  °  Sig: Take 1 capsule (50 mg total) by mouth daily.  °  Dispense:  30   capsule  °  Refill:  0  ° ° ° ° , MD ° °

## 2021-11-05 NOTE — Progress Notes (Signed)
Stopped taking on Sunday. Caused her to have nausea,headache,diarrhea, loss of appetite, dropping blood sugars,confusion, dizziness

## 2021-11-05 NOTE — Assessment & Plan Note (Signed)
Adderall cause nausea, sig weight loss, HA and diarrhea.

## 2021-11-05 NOTE — Assessment & Plan Note (Signed)
Is been having a couple hypoglycemic episodes secondary to poor appetite on the Adderall.  Now that she is off of it hopefully that should improve.  But just encouraged her to continue to work on healthy food choices and not overeat now that her appetite is rebounding.

## 2021-12-10 ENCOUNTER — Other Ambulatory Visit: Payer: Self-pay | Admitting: Family Medicine

## 2021-12-10 MED ORDER — LISDEXAMFETAMINE DIMESYLATE 50 MG PO CAPS: 50.0000 mg | ORAL_CAPSULE | Freq: Every day | ORAL | 0 refills | Status: DC

## 2021-12-18 ENCOUNTER — Other Ambulatory Visit: Payer: Self-pay

## 2021-12-18 ENCOUNTER — Encounter: Payer: Self-pay | Admitting: Physician Assistant

## 2021-12-18 ENCOUNTER — Ambulatory Visit (INDEPENDENT_AMBULATORY_CARE_PROVIDER_SITE_OTHER): Payer: Medicaid Other | Admitting: Physician Assistant

## 2021-12-18 VITALS — BP 132/80 | HR 101 | Ht 64.0 in | Wt 191.0 lb

## 2021-12-18 DIAGNOSIS — T7840XA Allergy, unspecified, initial encounter: Secondary | ICD-10-CM | POA: Insufficient documentation

## 2021-12-18 DIAGNOSIS — T7840XD Allergy, unspecified, subsequent encounter: Secondary | ICD-10-CM | POA: Diagnosis not present

## 2021-12-18 DIAGNOSIS — L509 Urticaria, unspecified: Secondary | ICD-10-CM | POA: Insufficient documentation

## 2021-12-18 DIAGNOSIS — L299 Pruritus, unspecified: Secondary | ICD-10-CM | POA: Insufficient documentation

## 2021-12-18 HISTORY — DX: Pruritus, unspecified: L29.9

## 2021-12-18 MED ORDER — METHYLPREDNISOLONE SODIUM SUCC 125 MG IJ SOLR
125.0000 mg | Freq: Once | INTRAMUSCULAR | Status: AC
  Administered 2021-12-18: 125 mg via INTRAMUSCULAR

## 2021-12-18 MED ORDER — PREDNISONE 20 MG PO TABS: ORAL_TABLET | ORAL | 0 refills | Status: DC

## 2021-12-18 MED ORDER — TRIAMCINOLONE ACETONIDE 0.1 % EX CREA
1.0000 | TOPICAL_CREAM | Freq: Two times a day (BID) | CUTANEOUS | 0 refills | Status: DC
Start: 2021-12-18 — End: 2023-01-15

## 2021-12-18 MED ORDER — EPINEPHRINE 0.3 MG/0.3ML IJ SOAJ: 0.3000 mg | Freq: Once | INTRAMUSCULAR | 0 refills | Status: AC

## 2021-12-18 NOTE — Progress Notes (Signed)
Subjective:    Patient ID: Rachael Fowler, female    DOB: [birth year], 36 y.o.   MRN: [id number]  HPI This is a 36 year old female presenting with intense pruritis and rash for 1 week. She states her family recently moved to a new home and these episodes of itching have coincided with coming into contact with carpeting in her garage which she first realized yesterday. There is urticaria present on her arms/hands, legs, chest. She went to UC 5 days ago and was given Solumedrol 125mg  injection, prednisone 5mg  dose pack, and Hydroxyzine which has provided some relief.  She denies changes to detergents, soaps, lotions, medications, etc. She states yesterday after re-exposure to the carpet her SpO2 dropped as low as 87% but rebounded to the uper 90%'s quickly upon sitting down and experienced mild shortness of breath.  Pt has not been checking her sugars while on prednisone. Her rash and itching is worse since re-exposure. No problems breathing, swallowing or with tongue swelling.   .. Active Ambulatory Problems    Diagnosis Date Noted   OTHER ACUTE REACTIONS TO STRESS 12/04/2009   Type 2 diabetes mellitus without complication, without long-term current use of insulin ([place]) 08/19/2017   Moderate persistent asthma with exacerbation 02/05/2018   Hirsutism 05/26/2018   Abnormal WBC count 05/26/2018   Psychophysiological insomnia 05/26/2018   Anxiety and depression 05/26/2018   Migraines 05/26/2018   Pain of left heel 07/14/2018   Pain of right heel 07/14/2018   Leukocytosis 07/14/2018   Patellofemoral syndrome, bilateral 08/17/2018   Lumbar spondylosis 08/17/2018   Mood disorder ([place]) 07/14/2019   Wrist pain, acute, right 05/30/2020   Asthma exacerbation 10/10/2020   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   HENT: Negative.    Respiratory:  Positive for shortness of breath (Mild, yesterday).   Skin:  Positive for itching and rash (Urticaria (legs, groin, torso, arms, hands, face)).      Objective:   Physical Exam ..Physical Exam Vitals reviewed.  Constitutional:      General: She is not in acute distress.    Appearance: Normal appearance.  HENT:     Head: Normocephalic and atraumatic.     Comments: No angioedema noted    Mouth/Throat:     Mouth: Mucous membranes are moist.     Pharynx: Oropharynx is clear.  Cardiovascular:     Rate and Rhythm: Regular rhythm.     Pulses: Normal pulses.     Heart sounds: Normal heart sounds. No murmur heard.   No friction rub. No gallop.  Pulmonary:     Effort: Pulmonary effort is normal.     Breath sounds: Normal breath sounds. No stridor. No wheezing.  Skin:    Findings: Rash (Generalized urticaria with associated scratching) present.  Neurological:     Mental Status: She is alert.      Assessment & Plan:   Kymm was seen today for rash.  Diagnoses and all orders  for this visit:  Hives -     triamcinolone cream (KENALOG) 0.1 %; Apply 1 application topically 2 (two) times daily. -     predniSONE (DELTASONE) 20 MG tablet; Take 3 tablets for 3 days take 2 tablets for 3 days take 1 tablet for 3 days take 1/2 tablet for 4 days. -     EPINEPHrine 0.3 mg/0.3 mL IJ SOAJ injection; Inject 0.3 mg into the muscle once for 1 dose. -     methylPREDNISolone sodium succinate (SOLU-MEDROL) 125 mg/2 mL injection 125 mg -     Ambulatory referral to Allergy  Itching -     triamcinolone cream (KENALOG) 0.1 %; Apply  1 application topically 2 (two) times daily. -     predniSONE (DELTASONE) 20 MG tablet; Take 3 tablets for 3 days take 2 tablets for 3 days take 1 tablet for 3 days take 1/2 tablet for 4 days. -     EPINEPHrine 0.3 mg/0.3 mL IJ SOAJ injection; Inject 0.3 mg into the muscle once for 1 dose. -     methylPREDNISolone sodium succinate (SOLU-MEDROL) 125 mg/2 mL injection 125 mg -     Ambulatory referral to Allergy  Allergic reaction, subsequent encounter -     triamcinolone cream (KENALOG) 0.1 %; Apply 1 application topically 2 (two) times daily. -     predniSONE (DELTASONE) 20 MG tablet; Take 3 tablets for 3 days take 2 tablets for 3 days take 1 tablet for 3 days take 1/2 tablet for 4 days. -     EPINEPHrine 0.3 mg/0.3 mL IJ SOAJ injection; Inject 0.3 mg into the muscle once for 1 dose. -     methylPREDNISolone sodium succinate (SOLU-MEDROL) 125 mg/2 mL injection 125 mg -     Ambulatory referral to Allergy   Avoid known allergy trigger. Follow with Allergy specialist for further allergen testing. Solumedrol IM 125mg  given today.  Start new Prednisone dose pack. Stay on claritin Pepcid 20mg  bid Continue Hydroxyzine up to 100mg  up to every 6 hours. Keep epi-pen on your person and use as directed in case of respiratory distress. Triamcinolone cream given for skin patches of increased pruritis. Follow up if not resolving or worsening within 1 week.

## 2021-12-18 NOTE — Patient Instructions (Addendum)
Stay on claritin Pepcid 20mg  twice a day Hydroxizine up to 100mg  up to every 6 hours New prednisone pack Epi pen if needed Referral to allergy  Triamcinolone as needed  Contact Dermatitis Dermatitis is redness, soreness, and swelling (inflammation) of the skin. Contact dermatitis is a reaction to certain substances that touch the skin. Many different substances can cause contact dermatitis. There are two types of contact dermatitis: Irritant contact dermatitis. This type is caused by something that irritates your skin, such as having dry hands from washing them too often with soap. This type does not require previous exposure to the substance for a reaction to occur. This is the most common type. Allergic contact dermatitis. This type is caused by a substance that you are allergic to, such as poison ivy. This type occurs when you have been exposed to the substance (allergen) and develop a sensitivity to it. Dermatitis may develop soon after your first exposure to the allergen, or it may not develop until the next time you are exposed and every time thereafter. What are the causes? Irritant contact dermatitis is most commonly caused by exposure to: Makeup. Soaps. Detergents. Bleaches. Acids. Metal salts, such as nickel. Allergic contact dermatitis is most commonly caused by exposure to: Poisonous plants. Chemicals. Jewelry. Latex. Medicines. Preservatives in products, such as clothing. What increases the risk? You are more likely to develop this condition if you have: A job that exposes you to irritants or allergens. Certain medical conditions, such as asthma or eczema. What are the signs or symptoms? Symptoms of this condition may occur on your body anywhere the irritant has touched you or is touched by you. Symptoms include: Dryness or flaking. Redness. Cracks. Itching. Pain or a burning feeling. Blisters. Drainage of small amounts of blood or clear fluid from skin  cracks. With allergic contact dermatitis, there may also be swelling in areas such as the eyelids, mouth, or genitals. How is this diagnosed? This condition is diagnosed with a medical history and physical exam. A patch skin test may be performed to help determine the cause. If the condition is related to your job, you may need to see an occupational medicine specialist. How is this treated? This condition is treated by checking for the cause of the reaction and protecting your skin from further contact. Treatment may also include: Steroid creams or ointments. Oral steroid medicines may be needed in more severe cases. Antibiotic medicines or antibacterial ointments, if a skin infection is present. Antihistamine lotion or an antihistamine taken by mouth to ease itching. A bandage (dressing). Follow these instructions at home: Skin care Moisturize your skin as needed. Apply cool compresses to the affected areas. Try applying baking soda paste to your skin. Stir water into baking soda until it reaches a paste-like consistency. Do not scratch your skin, and avoid friction to the affected area. Avoid the use of soaps, perfumes, and dyes. Medicines Take or apply over-the-counter and prescription medicines only as told by your health care provider. If you were prescribed an antibiotic medicine, take or apply the antibiotic as told by your health care provider. Do not stop using the antibiotic even if your condition improves. Bathing Try taking a bath with: Epsom salts. Follow the instructions on the packaging. You can get these at your local pharmacy or grocery store. Baking soda. Pour a small amount into the bath as directed by your health care provider. Colloidal oatmeal. Follow the instructions on the packaging. You can get this at your local pharmacy  or grocery store. Bathe less frequently, such as every other day. Bathe in lukewarm water. Avoid using hot water. Bandage care If you were  given a bandage (dressing), change it as told by your health care provider. Wash your hands with soap and water before and after you change your dressing. If soap and water are not available, use hand sanitizer. General instructions Avoid the substance that caused your reaction. If you do not know what caused it, keep a journal to try to track what caused it. Write down: What you eat. What cosmetic products you use. What you drink. What you wear in the affected area. This includes jewelry. Check the affected areas every day for signs of infection. Check for: More redness, swelling, or pain. More fluid or blood. Warmth. Pus or a bad smell. Keep all follow-up visits as told by your health care provider. This is important. Contact a health care provider if: Your condition does not improve with treatment. Your condition gets worse. You have signs of infection such as swelling, tenderness, redness, soreness, or warmth in the affected area. You have a fever. You have new symptoms. Get help right away if: You have a severe headache, neck pain, or neck stiffness. You vomit. You feel very sleepy. You notice red streaks coming from the affected area. Your bone or joint underneath the affected area becomes painful after the skin has healed. The affected area turns darker. You have difficulty breathing. Summary Dermatitis is redness, soreness, and swelling (inflammation) of the skin. Contact dermatitis is a reaction to certain substances that touch the skin. Symptoms of this condition may occur on your body anywhere the irritant has touched you or is touched by you. This condition is treated by figuring out what caused the reaction and protecting your skin from further contact. Treatment may also include medicines and skin care. Avoid the substance that caused your reaction. If you do not know what caused it, keep a journal to try to track what caused it. Contact a health care provider if your  condition gets worse or you have signs of infection such as swelling, tenderness, redness, soreness, or warmth in the affected area. This information is not intended to replace advice given to you by your health care provider. Make sure you discuss any questions you have with your health care provider. Document Revised: 02/02/2019 Document Reviewed: 04/28/2018 Elsevier Patient Education  2022 [employer].

## 2021-12-23 ENCOUNTER — Ambulatory Visit: Payer: Medicaid Other | Admitting: Physician Assistant

## 2022-01-03 ENCOUNTER — Other Ambulatory Visit: Payer: Self-pay | Admitting: Medical-Surgical

## 2022-01-09 ENCOUNTER — Other Ambulatory Visit: Payer: Self-pay | Admitting: Family Medicine

## 2022-01-10 MED ORDER — LISDEXAMFETAMINE DIMESYLATE 50 MG PO CAPS: 50.0000 mg | ORAL_CAPSULE | Freq: Every day | ORAL | 0 refills | Status: DC

## 2022-01-14 ENCOUNTER — Ambulatory Visit (INDEPENDENT_AMBULATORY_CARE_PROVIDER_SITE_OTHER): Payer: Medicaid Other | Admitting: Family Medicine

## 2022-01-14 ENCOUNTER — Encounter: Payer: Self-pay | Admitting: Family Medicine

## 2022-01-14 ENCOUNTER — Other Ambulatory Visit: Payer: Self-pay

## 2022-01-14 VITALS — BP 104/69 | HR 118 | Ht 64.0 in | Wt 189.0 lb

## 2022-01-14 DIAGNOSIS — F419 Anxiety disorder, unspecified: Secondary | ICD-10-CM

## 2022-01-14 DIAGNOSIS — F5102 Adjustment insomnia: Secondary | ICD-10-CM | POA: Insufficient documentation

## 2022-01-14 DIAGNOSIS — F902 Attention-deficit hyperactivity disorder, combined type: Secondary | ICD-10-CM | POA: Diagnosis not present

## 2022-01-14 DIAGNOSIS — G629 Polyneuropathy, unspecified: Secondary | ICD-10-CM

## 2022-01-14 DIAGNOSIS — F39 Unspecified mood [affective] disorder: Secondary | ICD-10-CM

## 2022-01-14 DIAGNOSIS — E119 Type 2 diabetes mellitus without complications: Secondary | ICD-10-CM | POA: Diagnosis not present

## 2022-01-14 DIAGNOSIS — M249 Joint derangement, unspecified: Secondary | ICD-10-CM

## 2022-01-14 DIAGNOSIS — R002 Palpitations: Secondary | ICD-10-CM

## 2022-01-14 DIAGNOSIS — F32A Depression, unspecified: Secondary | ICD-10-CM

## 2022-01-14 HISTORY — DX: Palpitations: R00.2

## 2022-01-14 MED ORDER — ESZOPICLONE 1 MG PO TABS: 1.0000 mg | ORAL_TABLET | Freq: Every evening | ORAL | 0 refills | Status: DC | PRN

## 2022-01-14 NOTE — Assessment & Plan Note (Signed)
Encouraged her to follow back up with endocrinology I am glad she is back on the Trulicity she has noted that she has been able to cut back on her insulin which is great.  She has a continuous glucose monitor. ?

## 2022-01-14 NOTE — Assessment & Plan Note (Signed)
Discussed some options.  She could consider going up to 75 mg on the Seroquel since the 100 mg was too too sedating but on the 50 she feels like her mood is really well controlled but the sleep is still not great we also discussed maybe a low-dose of Lunesta.  She actually has tried Ambien in the past but it caused palpitations. ?

## 2022-01-14 NOTE — Assessment & Plan Note (Signed)
Wanted to know if there were any other options besides the Lyrica which she had side effects on she is thinking about going back on it to help with some of the numbness and tingling that she gets particularly in her extremities.  We discussed that there really are not a lot of great options she had tried gabapentin at one point but sounds like she was having some type of skin reaction or potentially even hives so it was stopped.  And she really is not a great candidate for TCA because she is already on an SSRI. ?

## 2022-01-14 NOTE — Assessment & Plan Note (Signed)
Continue with current dose of Vyvanse 50 mg.  We can always decrease that if needed if it is coughing and causing over suppression of the appetite. ?

## 2022-01-14 NOTE — Assessment & Plan Note (Signed)
Palpitations with unclear etiology heart rate will jump up between the 130s and 160s with even just moderate activity.  She has had an echocardiogram so we discussed getting a heart monitor to wear for at least 10 to 14 days. ?

## 2022-01-14 NOTE — Progress Notes (Signed)
Established Patient Office Visit  Subjective:  Patient ID: Rachael Fowler, female    DOB: 02/04/1986  Age: 36 y.o. MRN: [id number]  CC:  Chief Complaint  Patient presents with   ADHD    HPI Rachael Fowler presents for   ADHD follow-up-we recently switched her from Adderall to Vyvanse the Adderall she felt was most overly suppressing her appetite to the point that she would skip eating and then have a hypoglycemic event she is diabetic and follows with endocrinology.  She actually feels like overall she is doing well with the Vyvanse we went up from 40 mg to 50 mg and she does feel like that is been helpful it definitely still causes a decrease in her Appetite.  But she says most days she really only eats about twice a day anyway usually late morning and then in the evening.   Diabetes-she follows with endocrinology and unfortunately missed her appointment last month she says she needs to call to get back in.  She was without her Trulicity for a couple of months because of the backorder issue but is back on it for the last couple of weeks.  She had been doing some tinctures while off of the medication in hopes of controlling her blood sugars she even tried some tinctures for hormones but says it actually made her feel worse so she stopped it.  Insomnia-she still really struggling with insomnia she tried a couple different medications but nothing seems to help that much.  She is currently on Seroquel 50 mg.  She was previously on 100 but felt like it was overly sedating so went back down to 50 mg.  Stress-she feels like one of her biggest issues is that she is under constant stress from the time she gets up until the time she goes to bed.  She said she noticed that when she was on prednisone recently for hives she felt her best.  She felt like she had energy and felt like she could get up and do a lot.  Most of the time she feels exhausted and fatigued.  Some days she is not sure how much she will be  able to do and then other days she can do a little bit more but gets tired quickly.  She is also been experiencing a lot of increased heart rate tachycardia.  She says even with just minor activity getting up and moving around the house sometimes her heart rate will jump up to between the 130s and 160s.  She can feel it and tell it on her watch.  She also has hypermobility of her joints and wonders if she could have Ehlers-Danlos syndrome.  She has never been evaluated formally.  No known family history.  Past Medical History:  Diagnosis Date   Asthma    Diabetes mellitus without complication (HCC)    Hypertension    Mental disorder     Past Surgical History:  Procedure Laterality Date   CESAREAN SECTION     KNEE SURGERY  04/20/2006    Family History  Problem Relation Age of Onset   Depression Mother    Diabetes Mother    Hyperlipidemia Mother    Hypertension Mother    Hyperlipidemia Father    Hypertension Father    Cancer Maternal Uncle    Diabetes Maternal Uncle    Cancer Maternal Grandmother    Hypertension Maternal Grandmother    Hyperlipidemia Maternal Grandmother    Hyperlipidemia Paternal Grandmother  Social History   Socioeconomic History   Marital status: Single    Spouse name: Not on file   Number of children: Not on file   Years of education: Not on file   Highest education level: Not on file  Occupational History   Not on file  Tobacco Use   Smoking status: Former    Packs/day: 0.50    Types: Cigarettes    Quit date: 01/22/2020    Years since quitting: 1.9   Smokeless tobacco: Never  Vaping Use   Vaping Use: Never used  Substance and Sexual Activity   Alcohol use: Not Currently    Comment: rarely   Drug use: Never   Sexual activity: Yes    Birth control/protection: I.U.D.  Other Topics Concern   Not on file  Social History Narrative   Not on file   Social Determinants of Health   Financial Resource Strain: Not on file  Food  Insecurity: Not on file  Transportation Needs: Not on file  Physical Activity: Not on file  Stress: Not on file  Social Connections: Not on file  Intimate Partner Violence: Not on file    Outpatient Medications Prior to Visit  Medication Sig Dispense Refill   albuterol (VENTOLIN HFA) 108 (90 Base) MCG/ACT inhaler Inhale 2 puffs into the lungs every 6 (six) hours as needed for wheezing or shortness of breath. 8 g 0   Blood Glucose Monitoring Suppl (ACCU-CHEK AVIVA PLUS) w/Device KIT Accu-Chek Aviva Plus Meter  USE AS DIRECTED     Continuous Blood Gluc Receiver (DEXCOM G6 RECEIVER) DEVI DISPENSE AND USE AS DIRECTED     Continuous Blood Gluc Sensor (DEXCOM G6 SENSOR) MISC Apply topically.     Continuous Blood Gluc Transmit (DEXCOM G6 TRANSMITTER) MISC      diclofenac sodium (VOLTAREN) 1 % GEL Apply 4 g topically 4 (four) times daily. To affected joint. 100 g 11   EPIPEN 2-PAK 0.3 MG/0.3ML SOAJ injection Inject into the muscle.     Erenumab-aooe (AIMOVIG) 70 MG/ML SOAJ Inject 70 mg into the skin every 30 (thirty) days. 1 mL 3   escitalopram (LEXAPRO) 20 MG tablet TAKE 1/2 TABLET DAILY FOR A WEEK THEN ONE TABLET BY MOUTH DAILY (Patient taking differently: Take 20 mg by mouth daily.) 90 tablet 1   insulin detemir (LEVEMIR) 100 UNIT/ML injection Inject 22 Units into the skin.     Insulin Pen Needle (BD PEN NEEDLE NANO U/F) 32G X 4 MM MISC Use with levemir daily     Insulin Syringe-Needle U-100 31G X 15/64" 0.3 ML MISC by Does not apply route.     ipratropium (ATROVENT) 0.06 % nasal spray Place 2 sprays into both nostrils 4 (four) times daily. As needed for runny nose / postnasal drip 15 mL 1   levonorgestrel (MIRENA) 20 MCG/24HR IUD 1 each by Intrauterine route once.     lisdexamfetamine (VYVANSE) 50 MG capsule Take 1 capsule (50 mg total) by mouth daily. 30 capsule 0   lisinopril (ZESTRIL) 10 MG tablet Take 1 tablet by mouth daily.     loratadine (CLARITIN) 10 MG tablet TAKE 1 TABLET BY MOUTH  EVERY DAY 30 tablet 17   meloxicam (MOBIC) 15 MG tablet Take 1 tablet (15 mg total) by mouth daily as needed. 90 tablet 1   metFORMIN (GLUCOPHAGE XR) 500 MG 24 hr tablet Take 2 tablets (1,000 mg total) by mouth daily with breakfast. (Patient taking differently: Take 1,000 mg by mouth 2 (two) times daily before  a meal.) 180 tablet 1   omeprazole (PRILOSEC) 10 MG capsule Take 1 capsule (10 mg total) by mouth daily. 90 capsule 1   QUEtiapine (SEROQUEL) 50 MG tablet Take 1 tablet (50 mg total) by mouth at bedtime. 90 tablet 1   SUMAtriptan (IMITREX) 50 MG tablet Take 0.5-1 tablets (25-50 mg total) by mouth every 2 (two) hours as needed for migraine. May repeat in 2 hours if headache persists or recurs. 10 tablet 3   triamcinolone cream (KENALOG) 0.1 % Apply 1 application topically 2 (two) times daily. 80 g 0   TRULICITY 4.5 MG/0.5ML SOPN SMARTSIG:4.5 Milligram(s) SUB-Q Once a Week     ACCU-CHEK GUIDE test strip      Dulaglutide (TRULICITY) 0.75 MG/0.5ML SOPN Inject 0.75 mg into the skin once a week. 4 pen 11   predniSONE (DELTASONE) 20 MG tablet Take 3 tablets for 3 days take 2 tablets for 3 days take 1 tablet for 3 days take 1/2 tablet for 4 days. 20 tablet 0   pregabalin (LYRICA) 100 MG capsule Take 1 capsule (100 mg total) by mouth 2 (two) times daily. NEEDS TO ESTABLISH WITH NEW PCP FOR FURTHER REFILLS 60 capsule 1   No facility-administered medications prior to visit.    Allergies  Allergen Reactions   Gabapentin Hives and Itching   Hydrocodone Itching, Nausea And Vomiting and Other (See Comments)    Also nausea and vomiting    Penicillins Itching, Nausea And Vomiting and Other (See Comments)   Adderall [Amphetamine-Dextroamphetamine] Other (See Comments)    Nausea, anorexia, diarrhea, severe weight loss.   Aripiprazole     Very restless, extreme bilateral arm pain   Penicillin G Itching and Nausea And Vomiting   Singulair [Montelukast] Other (See Comments)    Affected her mood    Hydrocodone-Acetaminophen Itching and Nausea And Vomiting   Zolpidem Palpitations    ROS Review of Systems    Objective:    Physical Exam Constitutional:      Appearance: Normal appearance. She is well-developed.  HENT:     Head: Normocephalic and atraumatic.  Cardiovascular:     Rate and Rhythm: Normal rate and regular rhythm.     Heart sounds: Normal heart sounds.  Pulmonary:     Effort: Pulmonary effort is normal.     Breath sounds: Normal breath sounds.  Skin:    General: Skin is warm and dry.  Neurological:     Mental Status: She is alert and oriented to person, place, and time.  Psychiatric:        Behavior: Behavior normal.    BP 104/69   Pulse (!) 118   Ht 5\' 4"  (1.626 m)   Wt 189 lb (85.7 kg)   SpO2 99%   BMI 32.44 kg/m  Wt Readings from Last 3 Encounters:  01/14/22 189 lb (85.7 kg)  12/18/21 191 lb (86.6 kg)  10/17/21 205 lb (93 kg)     There are no preventive care reminders to display for this patient.   There are no preventive care reminders to display for this patient.  Lab Results  Component Value Date   TSH 1.24 04/04/2021   Lab Results  Component Value Date   WBC 12.8 (H) 04/04/2021   HGB 12.9 04/04/2021   HCT 39.0 04/04/2021   MCV 87.2 04/04/2021   PLT 414 (H) 04/04/2021   Lab Results  Component Value Date   NA 139 04/04/2021   K 4.2 04/04/2021   CO2 31 04/04/2021  GLUCOSE 89 04/04/2021   BUN 9 04/04/2021   CREATININE 0.64 04/04/2021   BILITOT 0.3 04/04/2021   AST 50 (H) 04/04/2021   ALT 30 (H) 04/04/2021   PROT 6.4 04/04/2021   CALCIUM 10.0 04/04/2021   Lab Results  Component Value Date   CHOL 214 (H) 07/14/2018   Lab Results  Component Value Date   HDL 47 (L) 07/14/2018   Lab Results  Component Value Date   LDLCALC 119 (H) 07/14/2018   Lab Results  Component Value Date   TRIG 337 (H) 07/14/2018   Lab Results  Component Value Date   CHOLHDL 4.6 07/14/2018   Lab Results  Component Value Date   HGBA1C 6.8  08/28/2021      Assessment & Plan:   Problem List Items Addressed This Visit       Endocrine   Type 2 diabetes mellitus without complication, without long-term current use of insulin (HCC)    Encouraged her to follow back up with endocrinology I am glad she is back on the Trulicity she has noted that she has been able to cut back on her insulin which is great.  She has a continuous glucose monitor.      Relevant Medications   TRULICITY 4.5 MG/0.5ML SOPN   Other Relevant Orders   Sedimentation rate   C-reactive protein     Nervous and Auditory   Small fiber neuropathy    Wanted to know if there were any other options besides the Lyrica which she had side effects on she is thinking about going back on it to help with some of the numbness and tingling that she gets particularly in her extremities.  We discussed that there really are not a lot of great options she had tried gabapentin at one point but sounds like she was having some type of skin reaction or potentially even hives so it was stopped.  And she really is not a great candidate for TCA because she is already on an SSRI.      Relevant Medications   eszopiclone (LUNESTA) 1 MG TABS tablet     Other   Palpitations    Palpitations with unclear etiology heart rate will jump up between the 130s and 160s with even just moderate activity.  She has had an echocardiogram so we discussed getting a heart monitor to wear for at least 10 to 14 days.      Relevant Orders   LONG TERM MONITOR (3-14 DAYS)   Sedimentation rate   C-reactive protein   Mood disorder (HCC)    She does feel her current regimen works well just really struggling with coping with her stress.       Hypermobility of joint    I think she would be a good candidate for genetic testing but all the local genetics referrals in our area are booked out greater than a year that would include [facility], [facility] and [place].  Have used [facility] website for testing for  another patient. Unsure of insurance coverage for this.        Relevant Orders   Sedimentation rate   C-reactive protein   Attention deficit hyperactivity disorder (ADHD), combined type - Primary    Continue with current dose of Vyvanse 50 mg.  We can always decrease that if needed if it is coughing and causing over suppression of the appetite.      Relevant Orders   Sedimentation rate   C-reactive protein   Anxiety and  depression    Regard to constant stressors we discussed really working on having moments throughout the day where she can really decrease her stress even if it is just for a few minutes at a time.  Techniques such as deep breathing, walking outside, finding things like adult coloring books that again will just cause that part of her brain to calm down even just for a little while multiple times throughout the day can be incredibly helpful.  I do not think she has any type of cortisol deficiency in fact she is already seen endocrine and has been evaluated.      Relevant Orders   Sedimentation rate   C-reactive protein   Adjustment insomnia    Discussed some options.  She could consider going up to 75 mg on the Seroquel since the 100 mg was too too sedating but on the 50 she feels like her mood is really well controlled but the sleep is still not great we also discussed maybe a low-dose of Lunesta.  She actually has tried Ambien in the past but it caused palpitations.      Relevant Orders   Sedimentation rate   C-reactive protein    Meds ordered this encounter  Medications   eszopiclone (LUNESTA) 1 MG TABS tablet    Sig: Take 1 tablet (1 mg total) by mouth at bedtime as needed for sleep. Take immediately before bedtime    Dispense:  30 tablet    Refill:  0   ACCU-CHEK GUIDE test strip    Sig: Use as directed    Dispense:  100 each    Refill:  0    Follow-up: Return in about 2 months (around 03/16/2022) for Palpitations and fatigue.    Nani Gasser, MD

## 2022-01-14 NOTE — Assessment & Plan Note (Signed)
Regard to constant stressors we discussed really working on having moments throughout the day where she can really decrease her stress even if it is just for a few minutes at a time.  Techniques such as deep breathing, walking outside, finding things like adult coloring books that again will just cause that part of her brain to calm down even just for a little while multiple times throughout the day can be incredibly helpful.  I do not think she has any type of cortisol deficiency in fact she is already seen endocrine and has been evaluated. ?

## 2022-01-15 ENCOUNTER — Encounter: Payer: Self-pay | Admitting: Family Medicine

## 2022-01-15 ENCOUNTER — Ambulatory Visit (INDEPENDENT_AMBULATORY_CARE_PROVIDER_SITE_OTHER): Payer: Medicaid Other

## 2022-01-15 DIAGNOSIS — M249 Joint derangement, unspecified: Secondary | ICD-10-CM

## 2022-01-15 DIAGNOSIS — R002 Palpitations: Secondary | ICD-10-CM

## 2022-01-15 HISTORY — DX: Joint derangement, unspecified: M24.9

## 2022-01-15 MED ORDER — ACCU-CHEK GUIDE VI STRP: ORAL_STRIP | 0 refills | Status: DC

## 2022-01-15 NOTE — Assessment & Plan Note (Signed)
She does feel her current regimen works well just really struggling with coping with her stress.  ?

## 2022-01-15 NOTE — Assessment & Plan Note (Addendum)
I think she would be a good candidate for genetic testing but all the local genetics referrals in our area are booked out greater than a year that would include [facility], [facility] and [place]. ? ?Have used [facility] website for testing for another patient. Unsure of insurance coverage for this.   ?

## 2022-01-15 NOTE — Progress Notes (Unsigned)
Enrolled for [employer] to mail a ZIO XT long term holter monitor to the patients address on file.  

## 2022-01-16 ENCOUNTER — Telehealth: Payer: Self-pay

## 2022-01-16 NOTE — Telephone Encounter (Addendum)
Initiated Prior authorization POE:UMPNTIRWERX 1MG  tablets ?via: Covermymeds ?Case/Key: [id number] ?Status: denied as of 01/16/22 ?Reason:The drug that you asked for is not covered. We are denying your request because we do not show that  ?you have tried at least 2 preferred drugs. You have already tried Zolpidem. Other covered drug(s) is/are:  ?Flurazepam, Temazepam 15mg , 30mg  capsule. Note: Some covered drug(s) may have limits on the  ?quantity you can get. You can get this information on the list of covered drugs (Preferred Drug List) for  ?details. ?Additional clinical requirements include Exceeding Quantity Limit of 15 units per Calendar Month (oral  ?tablets/capsules/SL tablets) or Zolpimist (oral spray) -one canister per 60days: ?a. Beneficiary must have a diagnosis of chronic primary insomnia lasting one month or longer.  ?Beneficiary must have received information on good sleep hygiene and have a documented trial (at least  ?3 weeks) of non-pharmacological therapies ( e.g. stimulus control, sleep restriction, sleep hygiene  ?measures and relaxation therapy). Length of therapy may be approved for up to six months at a time. ?OR ?b. Beneficiary must have a diagnosis of chronic secondary or co-morbid insomnia lasting one month or  ?longer and has been evaluated for and is being actively treated for one of the following conditions: ?1. an underlying psychiatric illness associated with insomnia ?2. an underlying medical illness associated with insomnia (for example, chronic pain associated with  ?cancer, inflammatory arthritis) ?3. a sleep disorder such as restless legs syndrome, sleep-related breathing disorder, sleep-related  ?movement disorder, or circadian rhythm disorder. Length of therapy may be approved for up to six  ?months at a time. ? ?Notified Pt via: Mychart ?

## 2022-01-18 DIAGNOSIS — R002 Palpitations: Secondary | ICD-10-CM

## 2022-01-29 ENCOUNTER — Other Ambulatory Visit: Payer: Self-pay

## 2022-01-29 ENCOUNTER — Telehealth: Payer: Self-pay

## 2022-01-29 NOTE — Telephone Encounter (Addendum)
Initiated Prior authorization FUX:[id number] Fumarate 50MG  tablets ?Via: Covermymeds ?Case/Key:[id number]  ?Status: approved as of 01/29/22 ?Reason:This drug has been approved. Approved quantity: 90 tablets per 90 day(s). You may fill up to  ?a 34 day supply at a retail pharmacy. You may fill up to a 90 day supply for maintenance  ?drugs, please refer to the formulary for details. Please call the pharmacy to process your  ?prescription claim,Approval Timeframe: Start Date 01/28/2022 End Date 01/30/2023  ?Notified Pt via: Mychart ?

## 2022-02-04 ENCOUNTER — Ambulatory Visit: Payer: Medicaid Other | Admitting: Sports Medicine

## 2022-02-09 ENCOUNTER — Other Ambulatory Visit: Payer: Self-pay | Admitting: Family Medicine

## 2022-02-10 MED ORDER — LISDEXAMFETAMINE DIMESYLATE 50 MG PO CAPS: 50.0000 mg | ORAL_CAPSULE | Freq: Every day | ORAL | 0 refills | Status: DC

## 2022-02-12 ENCOUNTER — Other Ambulatory Visit: Payer: Self-pay | Admitting: Family Medicine

## 2022-02-12 MED ORDER — ESZOPICLONE 1 MG PO TABS: 1.0000 mg | ORAL_TABLET | Freq: Every evening | ORAL | 1 refills | Status: DC | PRN

## 2022-02-12 NOTE — Telephone Encounter (Signed)
Change of pharmacy

## 2022-02-12 NOTE — Telephone Encounter (Signed)
She is currently using good Rx to pay for the medication.  I really want to avoid the approved medications because they are all benzodiazepines and I really do not want to get her on something habit-forming. ?

## 2022-02-17 ENCOUNTER — Encounter: Payer: Self-pay | Admitting: Family Medicine

## 2022-02-17 ENCOUNTER — Other Ambulatory Visit: Payer: Self-pay | Admitting: Medical-Surgical

## 2022-02-17 NOTE — Progress Notes (Signed)
Hi Rachael Fowler,  ?Heart monitor is very reassuring everything was consistent with sinus rhythm so no arrhythmias which is fantastic.  Your heart rate definitely jumped up at times, the highest was 176 bpm but again it was normal sinus rhythm which is very reassuring.  1 option would be to consider putting you on a low-dose beta-blocker it will slow the pulse down a little bit we do not have to but will help control the symptoms.  But is not necessary.  Just depends on how you are feeling.

## 2022-02-19 ENCOUNTER — Other Ambulatory Visit: Payer: Self-pay | Admitting: Family Medicine

## 2022-02-19 MED ORDER — METOPROLOL SUCCINATE ER 25 MG PO TB24: 25.0000 mg | ORAL_TABLET | Freq: Every day | ORAL | 2 refills | Status: DC

## 2022-02-19 NOTE — Progress Notes (Signed)
Could definitely help with the lightheaded nests, shortness of breath and dizziness but may or may not help with tingling.  We will have to see if that improves or not.  I am just going to put you on a low-dose of metoprolol 25 mg.  If you feel like it is too much you can always break it in half and take half a tab daily.

## 2022-03-04 ENCOUNTER — Other Ambulatory Visit: Payer: Self-pay | Admitting: Family Medicine

## 2022-03-04 DIAGNOSIS — G43809 Other migraine, not intractable, without status migrainosus: Secondary | ICD-10-CM

## 2022-03-17 ENCOUNTER — Ambulatory Visit (INDEPENDENT_AMBULATORY_CARE_PROVIDER_SITE_OTHER): Payer: Medicaid Other | Admitting: Family Medicine

## 2022-03-17 ENCOUNTER — Ambulatory Visit (INDEPENDENT_AMBULATORY_CARE_PROVIDER_SITE_OTHER): Payer: Medicaid Other

## 2022-03-17 ENCOUNTER — Other Ambulatory Visit: Payer: Self-pay | Admitting: Family Medicine

## 2022-03-17 ENCOUNTER — Encounter: Payer: Self-pay | Admitting: Family Medicine

## 2022-03-17 VITALS — BP 118/73 | HR 102 | Resp 16 | Ht 64.0 in | Wt 204.0 lb

## 2022-03-17 DIAGNOSIS — M25551 Pain in right hip: Secondary | ICD-10-CM

## 2022-03-17 DIAGNOSIS — F5102 Adjustment insomnia: Secondary | ICD-10-CM | POA: Diagnosis not present

## 2022-03-17 DIAGNOSIS — M25552 Pain in left hip: Secondary | ICD-10-CM

## 2022-03-17 DIAGNOSIS — F32A Depression, unspecified: Secondary | ICD-10-CM

## 2022-03-17 DIAGNOSIS — F419 Anxiety disorder, unspecified: Secondary | ICD-10-CM

## 2022-03-17 DIAGNOSIS — F902 Attention-deficit hyperactivity disorder, combined type: Secondary | ICD-10-CM | POA: Diagnosis not present

## 2022-03-17 DIAGNOSIS — F39 Unspecified mood [affective] disorder: Secondary | ICD-10-CM

## 2022-03-17 LAB — POCT URINE PREGNANCY: Preg Test, Ur: NEGATIVE

## 2022-03-17 MED ORDER — LISDEXAMFETAMINE DIMESYLATE 50 MG PO CAPS: 50.0000 mg | ORAL_CAPSULE | Freq: Every day | ORAL | 0 refills | Status: DC

## 2022-03-17 MED ORDER — VILAZODONE HCL 10 MG PO TABS: ORAL_TABLET | ORAL | 0 refills | Status: DC

## 2022-03-17 MED ORDER — ESCITALOPRAM OXALATE 5 MG PO TABS: ORAL_TABLET | ORAL | 0 refills | Status: DC

## 2022-03-17 NOTE — Assessment & Plan Note (Signed)
Discussed options.  Overall I think she has done well on the Lexapro but she might benefit from something that might increase her energy level and be low bit more stimulating such as Viibryd.  She did try Wellbutrin in the past but had side effects with it.  We will taper the Lexapro and switch to Viibryd.  Follow-up in about 2 months.

## 2022-03-17 NOTE — Progress Notes (Signed)
Established Patient Office Visit  Subjective   Patient ID: Syble Picco, female    DOB: 1986/03/27  Age: 36 y.o. MRN: [id number]  Chief Complaint  Patient presents with   ADHD    Follow up    Follow-up    2 month for fatigue and palpitations. Patient states palpitations are better but still experiencing fatigue.    Hip Pain    Patient states she has been having a lot of hip and back pain for 1 month. No known injury.     HPI  ADHD - Reports symptoms are well controlled on current regime. Denies any problems with insomnia, chest pain, palpitations, or SOB.    2 month for fatigue and palpitations. Patient states palpitations are better but still experiencing fatigue.  Says every day she wakes up and she just feels exhausted.  She does snore but has not had sleep testing done.  Overall she feels like the shortness of breath is much better than it was.  Patient states she has been having a lot of hip and back pain for 1 month. No known injury.  Says she feels like her right hip moves in and out of the socket.  Most of her pain is on the outer portion of her hip.  Insomnia-she feels like the Alfonso Patten has been really wonderful and helpful.  She ended up just paying out-of-pocket for with a coupon since her insurance would not cover it she is currently on 1 mg she says she still feels a little bit groggy when she first wakes up in the morning.  She does not think she has had any side effect such as sleepwalking etc. at night.  Recently saw the allergist and was further evaluated.-she does take her Zyrtec and Flonase daily.  They also recommended eyedrops but she has not had a chance to start those yet.     ROS    Objective:     BP 118/73   Pulse (!) 102   Resp 16   Ht 5\' 4"  (1.626 m)   Wt 204 lb (92.5 kg)   SpO2 97%   BMI 35.02 kg/m    Physical Exam Vitals and nursing note reviewed.  Constitutional:      Appearance: She is well-developed.  HENT:     Head: Normocephalic and  atraumatic.  Cardiovascular:     Rate and Rhythm: Normal rate and regular rhythm.     Heart sounds: Normal heart sounds.  Pulmonary:     Effort: Pulmonary effort is normal.     Breath sounds: Normal breath sounds.  Skin:    General: Skin is warm and dry.  Neurological:     Mental Status: She is alert and oriented to person, place, and time.  Psychiatric:        Behavior: Behavior normal.     Results for orders placed or performed in visit on 03/17/22  POCT urine pregnancy  Result Value Ref Range   Preg Test, Ur Negative Negative      The ASCVD Risk score (Arnett DK, et al., 2019) failed to calculate for the following reasons:   The 2019 ASCVD risk score is only valid for ages 24 to 48    Assessment & Plan:   Problem List Items Addressed This Visit       Other   Mood disorder [facility])    Discussed options.  Overall I think she has done well on the Lexapro but she might benefit from something that might  increase her energy level and be low bit more stimulating such as Viibryd.  She did try Wellbutrin in the past but had side effects with it.  We will taper the Lexapro and switch to Viibryd.  Follow-up in about 2 months.       Relevant Medications   Vilazodone HCl (VIIBRYD) 10 MG TABS   Attention deficit hyperactivity disorder (ADHD), combined type - Primary    Well controlled. Continue current regimen. Follow up in  6 mo        Relevant Medications   lisdexamfetamine (VYVANSE) 50 MG capsule (Start on 05/15/2022)   lisdexamfetamine (VYVANSE) 50 MG capsule (Start on 04/16/2022)   lisdexamfetamine (VYVANSE) 50 MG capsule   Anxiety and depression   Relevant Medications   escitalopram (LEXAPRO) 5 MG tablet   Vilazodone HCl (VIIBRYD) 10 MG TABS   Adjustment insomnia    The Alfonso Patten has been working really well and overall she feels like it is helpful.  No significant side effects.       Other Visit Diagnoses     Left hip pain       Right hip pain       Relevant  Orders   POCT urine pregnancy (Completed)       Bilateral hip pain-we will get x-rays today looked back and we do not have any on file.  I still question whether or not she has a hypermobility syndrome and would probably benefit from testing for Ehlers-Danlos.  Fatigue-she does snore so she could possibly have sleep apnea but says right now she does not want to get tested because she says she really would not do anything about it if she found out that she had it.  She does not think she could wear a CPAP.  Return in about 2 months (around 05/17/2022).    Nani Gasser, MD

## 2022-03-17 NOTE — Assessment & Plan Note (Signed)
[name] has been working really well and overall she feels like it is helpful.  No significant side effects.

## 2022-03-17 NOTE — Assessment & Plan Note (Signed)
Well controlled. Continue current regimen. Follow up in  6 mo  

## 2022-03-17 NOTE — Patient Instructions (Signed)
I will send over a taper on the lexapro. Just follow instruction on bottle and then can start the Viibryd.

## 2022-03-18 ENCOUNTER — Encounter: Payer: Self-pay | Admitting: Family Medicine

## 2022-03-18 NOTE — Progress Notes (Signed)
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  So that is definitely probably contributing to some of the issue.  We could always get you in with sports medicine or even Dr. Karie Schwalbe if you would like and see what he would recommend he may just want to start with some strengthening around the hips like physical therapy.  Just let me know what you would like to do.

## 2022-03-31 ENCOUNTER — Encounter: Payer: Self-pay | Admitting: Family Medicine

## 2022-04-01 MED ORDER — ESCITALOPRAM OXALATE 20 MG PO TABS: 20.0000 mg | ORAL_TABLET | Freq: Every day | ORAL | 1 refills | Status: DC

## 2022-04-01 NOTE — Telephone Encounter (Signed)
Med sent. Needs nurse appt and bring kit

## 2022-04-13 ENCOUNTER — Other Ambulatory Visit: Payer: Self-pay | Admitting: Family Medicine

## 2022-04-18 ENCOUNTER — Encounter: Payer: Self-pay | Admitting: Family Medicine

## 2022-04-18 ENCOUNTER — Telehealth: Payer: Self-pay

## 2022-04-18 NOTE — Telephone Encounter (Addendum)
Initiated Prior authorization [id number] 1MG  tablets Via: Covermymeds Case/Key: Status: denied as of 04/18/22 Reason:The drug that you asked for is not covered. We are denying your request because we do not show that  you have tried at least 2 preferred drugs. You have already tried zolpidem tablet. Other covered drug(s)  is/are: flurazepam capsule, temazepam 15mg , 30mg  capsule. Note: Some covered drug(s) may have  limits on the quantity you can get. You can get this information on the list of covered drugs (Preferred  Drug List) for details. Additional clinical requirements include: Exceeding Quantity Limit of 15 units per Calendar Month (oral tablets/capsules/SL tablets) or Zolpimist  (oral spray) -one canister per 60 days: a. Beneficiary must have a diagnosis of chronic primary insomnia lasting one month or longer.  Beneficiary must have received information on good sleep hygiene and have a documented trial (at least  3 weeks) of non-pharmacological therapies ( e.g. stimulus control, sleep restriction, sleep hygiene  measures and relaxation therapy). Length of therapy may be approved for up to six months at a time.  OR b. Beneficiary must have a diagnosis of chronic secondary or co-morbid insomnia lasting one month or  longer and has been evaluated for and is being actively treated for one of the following conditions: 1. an underlying psychiatric illness associated with insomnia 2. an underlying medical illness associated with insomnia (for example, chronic pain associated with  cancer, inflammatory arthritis) 3. a sleep disorder such as restless legs syndrome, sleep-related breathing disorder, sleep-related  movement disorder, or circadian rhythm disorder. Length of therapy may be approved for up to six  months at a time. Notified Pt via: Mychart

## 2022-04-21 MED ORDER — ZALEPLON 5 MG PO CAPS: 5.0000 mg | ORAL_CAPSULE | Freq: Every evening | ORAL | 1 refills | Status: DC | PRN

## 2022-04-28 ENCOUNTER — Telehealth: Payer: Self-pay

## 2022-04-28 NOTE — Telephone Encounter (Addendum)
Initiated Prior authorization [id number] 5MG  capsules Via: Covermymeds Case/Key:[id number] Status: denied  as of 04/28/22 Reason:he drug that you asked for is not covered. We are denying your request because we do not show that you have tried at least 2 preferred drugs. You have already tried zolpidem. Other covered drug(s) is/are: temazepam 15mg  capsule, temazepam 30mg  capsule, and flurazepam capsule. Note: Some covered drug(s) may have limits on the quantity you can get. You can get this information on the list of covered drugs (Preferred Drug List) for details. Additional clinical requirements include a. Beneficiary must have a diagnosis of chronic primary insomnia lasting one month or longer. Beneficiary must have received information on good sleep hygiene and have a documented trial (at least 3 weeks) of non-pharmacological therapies ( e.g. stimulus control, sleep restriction, sleep hygiene measures and relaxation therapy). Length of therapy may be approved for up to six months at a time. OR b. Beneficiary is being discontinued from a sedative hypnotic and tapering is required to prevent symptoms of withdrawal. Length of therapy may be approved for up to three months at a time. OR c. Beneficiary is being actively assessed for a diagnosis of chronic primary or secondary/co-morbid insomnia. Beneficiary must have received information on good sleep hygiene and have a documented trial (at least 3 weeks) of non-pharmacological therapies ( e.g. stimulus control, sleep restriction, sleep hygiene measures and relaxation therapy). Length of therapy may be approved for six months one time only. Additional information may be requested to substantiate this status. Notified Pt via: Mychart

## 2022-05-05 ENCOUNTER — Other Ambulatory Visit: Payer: Self-pay | Admitting: Medical-Surgical

## 2022-05-05 DIAGNOSIS — M545 Low back pain, unspecified: Secondary | ICD-10-CM

## 2022-05-19 ENCOUNTER — Encounter: Payer: Self-pay | Admitting: Family Medicine

## 2022-05-19 ENCOUNTER — Ambulatory Visit (INDEPENDENT_AMBULATORY_CARE_PROVIDER_SITE_OTHER): Payer: Medicaid Other | Admitting: Family Medicine

## 2022-05-19 VITALS — BP 117/77 | HR 97 | Ht 64.0 in | Wt 197.0 lb

## 2022-05-19 DIAGNOSIS — F5102 Adjustment insomnia: Secondary | ICD-10-CM

## 2022-05-19 DIAGNOSIS — L853 Xerosis cutis: Secondary | ICD-10-CM

## 2022-05-19 DIAGNOSIS — M545 Low back pain, unspecified: Secondary | ICD-10-CM | POA: Diagnosis not present

## 2022-05-19 DIAGNOSIS — F419 Anxiety disorder, unspecified: Secondary | ICD-10-CM

## 2022-05-19 DIAGNOSIS — E119 Type 2 diabetes mellitus without complications: Secondary | ICD-10-CM

## 2022-05-19 DIAGNOSIS — G8929 Other chronic pain: Secondary | ICD-10-CM

## 2022-05-19 DIAGNOSIS — F902 Attention-deficit hyperactivity disorder, combined type: Secondary | ICD-10-CM

## 2022-05-19 DIAGNOSIS — F32A Depression, unspecified: Secondary | ICD-10-CM

## 2022-05-19 MED ORDER — MUPIROCIN 2 % EX OINT: TOPICAL_OINTMENT | Freq: Two times a day (BID) | CUTANEOUS | 0 refills | Status: DC

## 2022-05-19 MED ORDER — PREGABALIN 50 MG PO CAPS: 50.0000 mg | ORAL_CAPSULE | Freq: Two times a day (BID) | ORAL | 0 refills | Status: DC

## 2022-05-19 NOTE — Progress Notes (Unsigned)
Established Patient Office Visit  Subjective   Patient ID: Rachael Fowler, female    DOB: Mar 03, 1986  Age: 36 y.o. MRN: [id number]  Chief Complaint  Patient presents with   ADHD    HPI ADD - Reports symptoms are well controlled on current regime. Denies any problems with insomnia, chest pain, palpitations, or SOB.  Happy with her current regimen.   F/U insomnia -she has found the Lunesta very helpful and has just been paying out-of-pocket for it.  Would like a prescription sent to [facility].  She also wanted to discuss that hair growth on her chin.  She cannot afford permanent hair removal so most of the time she just tries to clear the hairs out herself.  She also gets a lot of dry skin and scale and wonders what she could use.  Sometimes she will try Neosporin and that seems to help a little bit.  She also has a place in the canal in her left ear that feels like a bump that will not go away it feels sore at times she says it will scab up and then she removes the scab but then it comes back.  Follow-up mood-she is currently taking 15 mg of Vyvanse.  She is splitting the 20s to do that and feels like that is working fairly well for her.  She says its not the best but so far it is probably one of the better options that she has tried.    ROS    Objective:     BP 117/77   Pulse 97   Ht 5\' 4"  (1.626 m)   Wt 197 lb (89.4 kg)   SpO2 97%   BMI 33.81 kg/m    Physical Exam Vitals and nursing note reviewed.  Constitutional:      Appearance: She is well-developed.  HENT:     Head: Normocephalic and atraumatic.     Right Ear: Tympanic membrane, ear canal and external ear normal.     Left Ear: Tympanic membrane and ear canal normal.     Ears:     Comments: Is a spot in the left ear canal that is mildly erythematous.  But no open wound or drainage. Cardiovascular:     Rate and Rhythm: Normal rate and regular rhythm.     Heart sounds: Normal heart sounds.  Pulmonary:     Effort:  Pulmonary effort is normal.     Breath sounds: Normal breath sounds.  Skin:    General: Skin is warm and dry.     Comments: No worrisome skin findings on the upper back.  Just some dry skin.  He has multiple small scabs on her chin area.  And some dry flaky skin as well.  Neurological:     Mental Status: She is alert and oriented to person, place, and time.  Psychiatric:        Behavior: Behavior normal.      No results found for any visits on 05/19/22.    The ASCVD Risk score (Arnett DK, et al., 2019) failed to calculate for the following reasons:   The 2019 ASCVD risk score is only valid for ages 48 to 67    Assessment & Plan:   Problem List Items Addressed This Visit       Endocrine   Type 2 diabetes mellitus without complication, without long-term current use of insulin (HCC)    She follows with endocrinology and is back on Trulicity though she admits she sometimes forgets  her injection.  In fact she had missed it for almost 2 weeks and then when she gave it again it caused significant nausea.  Did discuss with her that when she misses it for any more extended period of time that she will usually have to step back on the dose so just really encouraged her to make sure she is very consistent the goal would be for her to eventually come off of insulin.        Other   Chronic bilateral low back pain without sciatica    She would like to retry the Lyrica just reminded her that again it will not help with any of the numbness that she is experiencing it helps more with neuropathy pain.  But she would like to retry it so prescription sent to pharmacy cannot start back at 100 mg which is what she was taking previously but I did send over the 50 mg capsules.      Relevant Medications   pregabalin (LYRICA) 50 MG capsule   escitalopram (LEXAPRO) 10 MG tablet   escitalopram (LEXAPRO) 5 MG tablet   Other Relevant Orders   Urine Culture   Attention deficit hyperactivity disorder  (ADHD), combined type - Primary    Well controlled. Continue current regimen. Follow up in  6 mo       Relevant Medications   lisdexamfetamine (VYVANSE) 50 MG capsule (Start on 08/12/2022)   lisdexamfetamine (VYVANSE) 50 MG capsule (Start on 07/14/2022)   lisdexamfetamine (VYVANSE) 50 MG capsule (Start on 06/14/2022)   Anxiety and depression    Continue to work on stress reduction techniques.  We will adjust prescription so that she can more easily take 15 mg of Lexapro so we will write for 10 mg and 5 mg       Relevant Medications   escitalopram (LEXAPRO) 10 MG tablet   escitalopram (LEXAPRO) 5 MG tablet   Adjustment insomnia    The Lunesta seems to be working really well and she is just been paying cash for it since her insurance will not cover it is costing about $20 a month.  She will need a new prescription sent.      Other Visit Diagnoses     Dry skin dermatitis           Skin dermatitis she has a little patch in her left ear.  We discussed maybe using some mupirocin ointment on that since it continues to scab.  She has a dry spot on the back upper back and recommended using a topical steroid for that.  For dry skin on her face we discussed using Cereve or Cetaphil facial lotion to moisturize and help debride the dead skin.  She would really benefit from more long-term treatment for hair removal such as electrolysis and/or laser therapy and we discussed that but right now that would be a little financially prohibitive.   Return in about 14 weeks (around 08/25/2022) for ADD medication and mood.   I spent 40 minutes on the day of the encounter to include pre-visit record review, face-to-face time with the patient and post visit ordering of test.   Nani Gasser, MD

## 2022-05-19 NOTE — Patient Instructions (Signed)
Recommend a trial of CeraVe a facial lotion or Cetaphil facial lotion to moisturize the face after washing.

## 2022-05-20 ENCOUNTER — Telehealth: Payer: Self-pay | Admitting: Neurology

## 2022-05-20 DIAGNOSIS — G8929 Other chronic pain: Secondary | ICD-10-CM

## 2022-05-20 HISTORY — DX: Other chronic pain: G89.29

## 2022-05-20 MED ORDER — LISDEXAMFETAMINE DIMESYLATE 50 MG PO CAPS: 50.0000 mg | ORAL_CAPSULE | Freq: Every day | ORAL | 0 refills | Status: DC

## 2022-05-20 MED ORDER — ESZOPICLONE 1 MG PO TABS
1.0000 mg | ORAL_TABLET | Freq: Every evening | ORAL | 1 refills | Status: DC | PRN
Start: 2022-05-20 — End: 2022-12-11

## 2022-05-20 MED ORDER — ESCITALOPRAM OXALATE 10 MG PO TABS: 10.0000 mg | ORAL_TABLET | Freq: Every day | ORAL | 1 refills | Status: DC

## 2022-05-20 MED ORDER — ESCITALOPRAM OXALATE 5 MG PO TABS: 5.0000 mg | ORAL_TABLET | Freq: Every day | ORAL | 1 refills | Status: DC

## 2022-05-20 NOTE — Assessment & Plan Note (Signed)
Continue to work on stress reduction techniques.  We will adjust prescription so that she can more easily take 15 mg of Lexapro so we will write for 10 mg and 5 mg

## 2022-05-20 NOTE — Assessment & Plan Note (Signed)
She would like to retry the Lyrica just reminded her that again it will not help with any of the numbness that she is experiencing it helps more with neuropathy pain.  But she would like to retry it so prescription sent to pharmacy cannot start back at 100 mg which is what she was taking previously but I did send over the 50 mg capsules.

## 2022-05-20 NOTE — Telephone Encounter (Signed)
PA request received for Lunesta. Previously denied. Per Dr. Shelah Lewandowsky note patient is paying cash for medication. PA request archived.

## 2022-05-20 NOTE — Assessment & Plan Note (Signed)
She follows with endocrinology and is back on Trulicity though she admits she sometimes forgets her injection.  In fact she had missed it for almost 2 weeks and then when she gave it again it caused significant nausea.  Did discuss with her that when she misses it for any more extended period of time that she will usually have to step back on the dose so just really encouraged her to make sure she is very consistent the goal would be for her to eventually come off of insulin.

## 2022-05-20 NOTE — Assessment & Plan Note (Signed)
Well controlled. Continue current regimen. Follow up in  6 mo  

## 2022-05-20 NOTE — Assessment & Plan Note (Signed)
[name] seems to be working really well and she is just been paying cash for it since her insurance will not cover it is costing about $20 a month.  She will need a new prescription sent.

## 2022-05-22 LAB — TIQ- MISLABELED: Test Ordered On Req: 395

## 2022-05-22 NOTE — Progress Notes (Signed)
The specimen was mislabeled so we may need to contact patient to come recollect

## 2022-05-23 ENCOUNTER — Encounter: Payer: Self-pay | Admitting: Family Medicine

## 2022-05-23 LAB — URINE CULTURE
MICRO NUMBER:: 13702894
SPECIMEN QUALITY:: ADEQUATE

## 2022-05-23 LAB — PAT ID TIQ DOC: Test Affected: 395

## 2022-05-23 NOTE — Progress Notes (Signed)
Hi Peony, urine culture is negative.  No sign of infection.

## 2022-05-26 NOTE — Telephone Encounter (Signed)
Pls update pt that we are trying to get it corrected.

## 2022-06-03 ENCOUNTER — Other Ambulatory Visit: Payer: Self-pay | Admitting: Family Medicine

## 2022-06-04 LAB — HEMOGLOBIN A1C: Hemoglobin A1C: 6.4

## 2022-06-27 ENCOUNTER — Telehealth: Payer: Self-pay | Admitting: Family Medicine

## 2022-06-27 NOTE — Telephone Encounter (Signed)
Call patient and let her know that I did receive her gene diagnostic report back testing specifically for full disorders of connective tissue.  Results were negative so no sign of his Danlos or other hypermobility syndromes.  This is reassuring.

## 2022-07-07 ENCOUNTER — Other Ambulatory Visit: Payer: Self-pay | Admitting: Family Medicine

## 2022-07-08 NOTE — Telephone Encounter (Signed)
Pt advised no questions.

## 2022-08-09 ENCOUNTER — Other Ambulatory Visit: Payer: Self-pay | Admitting: Family Medicine

## 2022-08-09 DIAGNOSIS — G8929 Other chronic pain: Secondary | ICD-10-CM

## 2022-08-13 ENCOUNTER — Other Ambulatory Visit: Payer: Self-pay | Admitting: Family Medicine

## 2022-08-13 DIAGNOSIS — F902 Attention-deficit hyperactivity disorder, combined type: Secondary | ICD-10-CM

## 2022-08-13 MED ORDER — LISDEXAMFETAMINE DIMESYLATE 50 MG PO CAPS: 50.0000 mg | ORAL_CAPSULE | Freq: Every day | ORAL | 0 refills | Status: DC

## 2022-08-13 NOTE — Telephone Encounter (Signed)
Meds ordered this encounter  Medications   lisdexamfetamine (VYVANSE) 50 MG capsule    Sig: Take 1 capsule (50 mg total) by mouth daily.    Dispense:  30 capsule    Refill:  0   lisdexamfetamine (VYVANSE) 50 MG capsule    Sig: Take 1 capsule (50 mg total) by mouth daily.    Dispense:  30 capsule    Refill:  0   lisdexamfetamine (VYVANSE) 50 MG capsule    Sig: Take 1 capsule (50 mg total) by mouth daily.    Dispense:  30 capsule    Refill:  0

## 2022-08-13 NOTE — Telephone Encounter (Signed)
Duplicate auto refill request. No auth to denied request.

## 2022-08-25 ENCOUNTER — Ambulatory Visit: Payer: Medicaid Other | Admitting: Family Medicine

## 2022-08-28 ENCOUNTER — Ambulatory Visit (INDEPENDENT_AMBULATORY_CARE_PROVIDER_SITE_OTHER): Payer: Medicaid Other | Admitting: Family Medicine

## 2022-08-28 ENCOUNTER — Encounter: Payer: Self-pay | Admitting: Family Medicine

## 2022-08-28 VITALS — BP 122/67 | HR 104 | Ht 64.0 in | Wt 199.8 lb

## 2022-08-28 DIAGNOSIS — Z23 Encounter for immunization: Secondary | ICD-10-CM

## 2022-08-28 DIAGNOSIS — M249 Joint derangement, unspecified: Secondary | ICD-10-CM

## 2022-08-28 DIAGNOSIS — F5104 Psychophysiologic insomnia: Secondary | ICD-10-CM

## 2022-08-28 DIAGNOSIS — F902 Attention-deficit hyperactivity disorder, combined type: Secondary | ICD-10-CM

## 2022-08-28 DIAGNOSIS — I10 Essential (primary) hypertension: Secondary | ICD-10-CM

## 2022-08-28 DIAGNOSIS — J029 Acute pharyngitis, unspecified: Secondary | ICD-10-CM

## 2022-08-28 DIAGNOSIS — F419 Anxiety disorder, unspecified: Secondary | ICD-10-CM | POA: Diagnosis not present

## 2022-08-28 DIAGNOSIS — G629 Polyneuropathy, unspecified: Secondary | ICD-10-CM | POA: Diagnosis not present

## 2022-08-28 DIAGNOSIS — F32A Depression, unspecified: Secondary | ICD-10-CM

## 2022-08-28 LAB — POC COVID19 BINAXNOW: SARS Coronavirus 2 Ag: NEGATIVE

## 2022-08-28 LAB — POCT RAPID STREP A (OFFICE): Rapid Strep A Screen: NEGATIVE

## 2022-08-28 NOTE — Assessment & Plan Note (Signed)
We will place a rheumatology referral for further work-up.  Genetic testing was negative through [facility]

## 2022-08-28 NOTE — Assessment & Plan Note (Signed)
Well on current regimen of Lexapro 15 mg daily.

## 2022-08-28 NOTE — Progress Notes (Signed)
Established Patient Office Visit  Subjective   Patient ID: Rachael Fowler, female    DOB: 1986/02/27  Age: 36 y.o. MRN: [id number]  Chief Complaint  Patient presents with   Medication Refill    HPI  ADD - Reports symptoms are well controlled on current regime. Denies any problems with insomnia, chest pain, palpitations, or SOB.  She did run out of medication for about a week and when she restarted it she noticed a big difference.  Follow-up anxiety/depression-currently on Lexapro 10 mg and 5 mg daily for a total of 15 mg.  She also wanted to retry Lyrica for the neuropathy pain that she experiences in her extremities she mostly experiences numbness. She has been able to rest a little better on the medication.   Woke up this AM with ST.  She had a pos exposure to strep throat over the weekend. Was babysitting.  She also has some nasal congestion and is started to get a little bit of a cough as well.  No fever or chills.  Would still like to be further evaluated for hypermobility syndrome even though the genetic testing came back negative.    ROS    Objective:     BP 122/67   Pulse (!) 104   Ht 5\' 4"  (1.626 m)   Wt 199 lb 12.8 oz (90.6 kg)   SpO2 98%   BMI 34.30 kg/m    Physical Exam Vitals and nursing note reviewed.  Constitutional:      Appearance: She is well-developed.  HENT:     Head: Normocephalic and atraumatic.     Right Ear: Tympanic membrane, ear canal and external ear normal.     Left Ear: Tympanic membrane, ear canal and external ear normal.     Nose: Nose normal.     Mouth/Throat:     Pharynx: Oropharynx is clear.  Eyes:     Conjunctiva/sclera: Conjunctivae normal.     Pupils: Pupils are equal, round, and reactive to light.  Neck:     Thyroid: No thyromegaly.  Cardiovascular:     Rate and Rhythm: Normal rate and regular rhythm.     Heart sounds: Normal heart sounds.  Pulmonary:     Effort: Pulmonary effort is normal.     Breath sounds: Normal  breath sounds. No wheezing.  Musculoskeletal:     Cervical back: Neck supple.  Lymphadenopathy:     Cervical: No cervical adenopathy.  Skin:    General: Skin is warm and dry.  Neurological:     Mental Status: She is alert and oriented to person, place, and time.  Psychiatric:        Behavior: Behavior normal.      Results for orders placed or performed in visit on 08/28/22  Hemoglobin A1c  Result Value Ref Range   Hemoglobin A1C 6.4   POC COVID-19  Result Value Ref Range   SARS Coronavirus 2 Ag Negative Negative  POCT rapid strep A  Result Value Ref Range   Rapid Strep A Screen Negative Negative      The ASCVD Risk score (Arnett DK, et al., 2019) failed to calculate for the following reasons:   The 2019 ASCVD risk score is only valid for ages 80 to 21    Assessment & Plan:   Problem List Items Addressed This Visit       Cardiovascular and Mediastinum   High blood pressure    Pressure looks great today.  We will continue to monitor.  Relevant Orders   Lipid Panel w/reflex Direct LDL   COMPLETE METABOLIC PANEL WITH GFR   CBC     Nervous and Auditory   Small fiber neuropathy    Does feel like the Lyrica is helping her rest a little bit better at night so we will continue with the medication for now.      Relevant Orders   Lipid Panel w/reflex Direct LDL   COMPLETE METABOLIC PANEL WITH GFR   CBC     Other   Psychophysiological insomnia    Doing well with Lunesta.      Hypermobility of joint    We will place a rheumatology referral for further work-up.  Genetic testing was negative through [facility]      Relevant Orders   Ambulatory referral to Rheumatology   Attention deficit hyperactivity disorder (ADHD), combined type - Primary    Continue current regimen.      Anxiety and depression    Well on current regimen of Lexapro 15 mg daily.      Other Visit Diagnoses     Sore throat       Relevant Orders   POC COVID-19 (Completed)    POCT rapid strep A (Completed)   Need for immunization against influenza       Relevant Orders   Flu Vaccine QUAD 3mo+IM (Fluarix, Fluzone & Alfiuria Quad PF) (Completed)      Sore throat-negative for strep and COVID she also has a cough and congestion most consistent with upper respiratory infection.  Recommend symptomatic care.  If not better in 1 week then please let us know.  Return in about 4 months (around 12/27/2022) for ADHD, Mood .    Nani Gasser, MD

## 2022-08-28 NOTE — Assessment & Plan Note (Signed)
Does feel like the Lyrica is helping her rest a little bit better at night so we will continue with the medication for now.

## 2022-08-28 NOTE — Assessment & Plan Note (Signed)
Continue current regimen

## 2022-08-28 NOTE — Assessment & Plan Note (Signed)
Pressure looks great today.  We will continue to monitor.

## 2022-08-28 NOTE — Assessment & Plan Note (Signed)
Doing well with Lunesta.

## 2022-09-09 ENCOUNTER — Other Ambulatory Visit: Payer: Self-pay | Admitting: Family Medicine

## 2022-09-26 ENCOUNTER — Ambulatory Visit (INDEPENDENT_AMBULATORY_CARE_PROVIDER_SITE_OTHER): Payer: Medicaid Other | Admitting: Family Medicine

## 2022-09-26 ENCOUNTER — Encounter: Payer: Self-pay | Admitting: Family Medicine

## 2022-09-26 VITALS — BP 115/71 | HR 91 | Temp 98.3°F | Wt 203.0 lb

## 2022-09-26 DIAGNOSIS — J329 Chronic sinusitis, unspecified: Secondary | ICD-10-CM | POA: Diagnosis not present

## 2022-09-26 DIAGNOSIS — J40 Bronchitis, not specified as acute or chronic: Secondary | ICD-10-CM

## 2022-09-26 DIAGNOSIS — W5503XA Scratched by cat, initial encounter: Secondary | ICD-10-CM | POA: Diagnosis not present

## 2022-09-26 MED ORDER — DOXYCYCLINE HYCLATE 100 MG PO TABS: 100.0000 mg | ORAL_TABLET | Freq: Two times a day (BID) | ORAL | 0 refills | Status: DC

## 2022-09-26 MED ORDER — PREDNISONE 20 MG PO TABS: 40.0000 mg | ORAL_TABLET | Freq: Every day | ORAL | 0 refills | Status: DC

## 2022-09-26 MED ORDER — MUPIROCIN 2 % EX OINT: TOPICAL_OINTMENT | Freq: Two times a day (BID) | CUTANEOUS | 0 refills | Status: DC

## 2022-09-26 NOTE — Progress Notes (Signed)
Acute Office Visit  Subjective:     Patient ID: Rachael Fowler, female    DOB: November 24, 1985, 36 y.o.   MRN: [id number]  Chief Complaint  Patient presents with   Cough    Cough Associated symptoms include ear pain, a sore throat, shortness of breath and wheezing. Pertinent negatives include no chest pain, chills or fever.   Patient is a 36 year old female presenting to clinic for cough and chest congestion. Symptoms began two weeks ago and have gotten worse over then last few days. She wakes up with a lot of nasal congestion and drainage with brown and green mucus. Drainage has made her throat sore. She has had some chest congestion with shortness of breath and wheezing worse with activity and better with rest. She has history of asthma. Denies any albuterol use. She has not tried any OTC medications for symptom relief. No recent sick contacts. Denies any fever, nausea, vomiting.   Her cat scratched her back about a month ago. It has been itchy and she keeps scratching at it so the wound has not healed.     Review of Systems  Constitutional:  Positive for malaise/fatigue. Negative for chills and fever.  HENT:  Positive for congestion, ear pain and sore throat.   Respiratory:  Positive for cough, shortness of breath and wheezing. Negative for sputum production.   Cardiovascular:  Negative for chest pain and palpitations.        Objective:    BP 115/71   Pulse 91   Temp 98.3 F (36.8 C)   Wt 92.1 kg   SpO2 100%   BMI 34.84 kg/m    Physical Exam Constitutional:      Appearance: Normal appearance.  HENT:     Right Ear: Tympanic membrane normal.     Left Ear: Tympanic membrane normal.     Nose: Congestion present.     Mouth/Throat:     Pharynx: Oropharynx is clear.  Cardiovascular:     Rate and Rhythm: Normal rate and regular rhythm.  Pulmonary:     Effort: Pulmonary effort is normal.     Breath sounds: Normal breath sounds.  Skin:    Comments: Small excoriation middle  back  Neurological:     Mental Status: She is alert.     No results found for any visits on 09/26/22.      Assessment & Plan:   Problem List Items Addressed This Visit   None Visit Diagnoses     Sinobronchitis    -  Primary   Relevant Medications   doxycycline (VIBRA-TABS) 100 MG tablet   predniSONE (DELTASONE) 20 MG tablet   Cat scratch       Relevant Medications   mupirocin ointment (BACTROBAN) 2 %       Meds ordered this encounter  Medications   doxycycline (VIBRA-TABS) 100 MG tablet    Sig: Take 1 tablet (100 mg total) by mouth 2 (two) times daily.    Dispense:  14 tablet    Refill:  0   predniSONE (DELTASONE) 20 MG tablet    Sig: Take 2 tablets (40 mg total) by mouth daily with breakfast.    Dispense:  10 tablet    Refill:  0   mupirocin ointment (BACTROBAN) 2 %    Sig: Apply topically 2 (two) times daily. To skin    Dispense:  30 g    Refill:  0   Sinobronchitis Due to ongoing and worsening symptoms likely sinobronchitis.  Prescribed doxycycline and prednisone. Encouraged albuterol use as needed for SOB.  Drink plenty of fluids and rest. Follow up as needed if worsening or persist symptoms.   Cat Scratch Apply mupirocin ointment to affected area twice daily. Keep area covered with band-aid to prevent further scratching.    No follow-ups on file.  Charleen Kirks, Student-PA

## 2022-09-26 NOTE — Progress Notes (Signed)
Patient was interviewed and examined by me personally.  Agree with note below and care plan.

## 2022-10-23 ENCOUNTER — Ambulatory Visit: Payer: Medicaid Other | Admitting: Family Medicine

## 2022-11-04 ENCOUNTER — Ambulatory Visit: Payer: Medicaid Other | Admitting: Family Medicine

## 2022-11-13 ENCOUNTER — Ambulatory Visit: Payer: Medicaid Other | Admitting: Sports Medicine

## 2022-11-13 ENCOUNTER — Other Ambulatory Visit: Payer: Self-pay | Admitting: Family Medicine

## 2022-11-19 ENCOUNTER — Ambulatory Visit (INDEPENDENT_AMBULATORY_CARE_PROVIDER_SITE_OTHER): Payer: Medicaid Other | Admitting: Obstetrics and Gynecology

## 2022-11-19 ENCOUNTER — Encounter: Payer: Self-pay | Admitting: Obstetrics and Gynecology

## 2022-11-19 VITALS — BP 125/83 | HR 105 | Ht 64.0 in | Wt 205.0 lb

## 2022-11-19 DIAGNOSIS — Z01419 Encounter for gynecological examination (general) (routine) without abnormal findings: Secondary | ICD-10-CM | POA: Diagnosis not present

## 2022-11-19 DIAGNOSIS — Z3169 Encounter for other general counseling and advice on procreation: Secondary | ICD-10-CM

## 2022-11-19 DIAGNOSIS — Z30432 Encounter for removal of intrauterine contraceptive device: Secondary | ICD-10-CM | POA: Diagnosis not present

## 2022-11-19 NOTE — Patient Instructions (Addendum)
The best days to conceive are the 1-2 days before you ovulate and the day you ovulate.  You can predict the day of ovulation in 4 ways: Tracking with a calendar. This works best if you have extremely regular menstrual cycles. Tracking changes to cervical mucus. You are most likely to conceive when cervical mucus is clear and slippery. Tracking your body temperature. This is less helpful because your body temperature doesn't rise until AFTER you ovulated (too late to time intercourse) It is ideal to use a special basal body temperature thermometer since it can pick up subtle differences. You take your temperature first thing in the morning - before you get out of bed, use the bathroom, or eat/drink anything. You will need to chart your temperature every day to be able to pick up on a meaningful rise. Tracking with ovulation predictor kits. These are urine tests that you do at home. They pick up a hormone in your urine called "LH" which spikes right before you ovulate. You should time intercourse to the day of the [facility] spike. You want to start testing in the 5 days before you think you'll ovulate, but the kits usually come with instructions that explain when to use them.  If you are trying to get pregnant, take a prenatal vitamin every day. They work best to help your baby's brain & spinal cord growth if you're taking them BEFORE you get pregnant.

## 2022-11-19 NOTE — Progress Notes (Signed)
ANNUAL EXAM Patient name: Rachael Fowler MRN [id number]  Date of birth: May 07, 1986 Chief Complaint:   Annual Exam  History of Present Illness:   Rachael Fowler is a 37 y.o. G1P1001 with No LMP recorded. (Menstrual status: IUD). being seen today for a routine annual exam.  Current complaints: Desired IUD removal, would like to conceive   Upstream - 11/19/22 1603       Pregnancy Intention Screening   Does the patient want to become pregnant in the next year? Yes    Does the patient's partner want to become pregnant in the next year? N/A    Would the patient like to discuss contraceptive options today? No      Contraception Wrap Up   Current Method IUD or IUS    End Method Pregnant/Seeking Pregnancy    How was the end contraceptive method provided? N/A            The pregnancy intention screening data noted above was reviewed. Potential methods of contraception were discussed. The patient elected to proceed with Pregnant/Seeking Pregnancy.   Last pap 09/11/2019. Results were: NILM w/ HRHPV negative. H/O abnormal pap: no Last mammogram: n/a. Family h/o breast cancer: no Last colonoscopy: n/a Family h/o colorectal cancer: no     08/28/2022    1:33 PM 05/19/2022    4:10 PM 10/23/2021    3:41 PM 07/17/2021    2:11 PM 06/19/2021    1:41 PM  Depression screen PHQ 2/9  Decreased Interest 1 1 2 2 3   Down, Depressed, Hopeless 1 1 1 1 1   PHQ - 2 Score 2 2 3 3 4   Altered sleeping  1 3 3 3   Tired, decreased energy  1 3 1 3   Change in appetite  3 2 2 2   Feeling bad or failure about yourself   0 0 1 1  Trouble concentrating  1 1 1 2   Moving slowly or fidgety/restless  1 1 0 1  Suicidal thoughts  0 0 0 0  PHQ-9 Score  9 13 11 16   Difficult doing work/chores  Very difficult Very difficult Very difficult Extremely dIfficult      08/28/2022    1:32 PM 05/19/2022    4:10 PM 10/23/2021    3:43 PM 07/17/2021    2:11 PM  GAD 7 : Generalized Anxiety Score  Nervous, Anxious, on Edge 1 1 2 2    Control/stop worrying 0 0 1 1  Worry too much - different things 0 0 1 1  Trouble relaxing 1 1 3 2   Restless 1 1 1 1   Easily annoyed or irritable 3 2 2 3   Afraid - awful might happen 0 0 0 0  Total GAD 7 Score 6 5 10 10   Anxiety Difficulty Very difficult Very difficult Very difficult Very difficult   Review of Systems:   Pertinent items are noted in HPI Denies any headaches, blurred vision, fatigue, shortness of breath, chest pain, abdominal pain, abnormal vaginal discharge/itching/odor/irritation, problems with periods, bowel movements, urination, or intercourse unless otherwise stated above. Pertinent History Reviewed:  Reviewed past medical,surgical, social and family history.  Reviewed problem list, medications and allergies. Physical Assessment:   Vitals:   11/19/22 1516  BP: 125/83  Pulse: (!) 105  Weight: 205 lb (93 kg)  Height: 5\' 4"  (1.626 m)  Body mass index is 35.19 kg/m.        Physical Examination:   General appearance - well appearing, and in no distress  Mental status -  alert, oriented to person, place, and time  Chest - respiratory effort normal  Heart - normal peripheral perfusion  Breasts - breasts appear normal, no suspicious masses, no skin or nipple changes or axillary nodes  Abdomen - soft, nontender, nondistended, no masses or organomegaly  Pelvic - VULVA: normal appearing vulva with no masses, tenderness or lesions  VAGINA: normal appearing vagina with normal color and discharge, no lesions  CERVIX: normal appearing cervix without discharge or lesions, no CMT  Chaperone present for exam  No results found for this or any previous visit (from the past 24 hour(s)).  Assessment & Plan:  1) Well-Woman Exam Mammogram: @ 34yo, or sooner if problems Colonoscopy: @ 35yo, or sooner if problems Pap: Up to date  2) Preconception counseling Reviewed medication list and history of cHTN, T2DM, PCOS. Reviewed that all of these diagnoses would make her pregnancy  high risk for complication.  Reviewed optimization of BP & BG. Last A1c 5.7. Discussed that she would need to switch her anithypertensive from lisinopril and discontinue trulicity with pregnancy. Reviewed that T2DM in pregnancy is typically managed with insulin. Pt will review with PCP and endocrinologist Start PNV now Info provided on cycle tracking and OPKs. Spontaneously conceived her first child, so would like to see if she can conceive naturally  3) IUD removal See procedure note below  Labs/procedures today:  IUD removal  Meds: No orders of the defined types were placed in this encounter.  Follow-up: Return in about 1 year (around 11/20/2023) for annual exam.  Inez Catalina, MD 11/19/2022 5:13 PM     GYNECOLOGY OFFICE PROCEDURE NOTE  Rachael Fowler is a 37 y.o. G1P1001 here for IUD removal.   IUD Removal  Patient identified, informed consent performed, consent signed.  Patient was in the dorsal lithotomy position, normal external genitalia was noted.  A speculum was placed in the patient's vagina, normal discharge was noted, no lesions. The cervix was visualized, no lesions, no abnormal discharge.  The strings of the IUD were grasped and pulled using ring forceps. The IUD was removed in its entirety.   Patient tolerated the procedure well.    Patient plans for pregnancy soon and she was told to avoid teratogens, take PNV and folic acid.  Routine preventative health maintenance measures emphasized.  Gale Journey, MD [place], [facility] for [employer], [place]

## 2022-11-20 ENCOUNTER — Ambulatory Visit: Payer: Medicaid Other | Admitting: Sports Medicine

## 2022-11-27 ENCOUNTER — Other Ambulatory Visit: Payer: Self-pay | Admitting: Family Medicine

## 2022-11-27 DIAGNOSIS — M545 Low back pain, unspecified: Secondary | ICD-10-CM

## 2022-12-10 LAB — HEMOGLOBIN A1C: Hemoglobin A1C: 6.4

## 2022-12-11 ENCOUNTER — Other Ambulatory Visit: Payer: Self-pay | Admitting: Family Medicine

## 2022-12-11 ENCOUNTER — Telehealth: Payer: Self-pay

## 2022-12-11 NOTE — Telephone Encounter (Signed)
Prior authorization started through CoverMyMeds for eszopiclone.   Key - [id number]

## 2022-12-19 NOTE — Telephone Encounter (Signed)
Initiated Prior authorization [id number] '1MG'$  tablets Via: Covermymeds Case/Key:[id number] Status: denied as of 12/19/22 Reason:COMMENTS:  The following criteria from the health plan guideline, Sedative Hypnotics, must be met before we can  approve this request. Please send [place] supporting chart notes and lab results. Exceeding Quantity Limit of 15 units per Calendar Month (oral tablets/capsules/SL tablets) or Zolpimist (oral  spray) -one canister per 60days a. Beneficiary must have a diagnosis of chronic primary insomnia lasting one month orlonger. Beneficiary  must have received information on good sleep hygiene and have a documented trial (at least 3 weeks) of  non-pharmacological therapies ( e.g. stimulus control, sleep restriction, sleep hygiene measures and  relaxation therapy). Length of therapy may be approved for up to six months at a time. OR b. Beneficiary must have a diagnosis of chronic secondary or co-morbid insomnia lasting one month or  longer and has been evaluated for and is being actively treated for one of the following conditions: 1. an underlying psychiatric illness associated with insomnia 2. an underlying medical illness associated with insomnia (for example, chronic pain associated with cancer,  inflammatory arthritis) 3. a sleep disorder such asrestlesslegs syndrome, sleep-related breathing disorder, sleep-related movement  disorder, or circadian rhythm disorder. Length of therapy may be approved for up to six months at a time. OR c. Beneficiary is being discontinued from a sedative hypnotic and tapering is required to prevent symptoms  of withdrawal. Length of therapy may be approved for up to three months at a time. OR d. Beneficiary is being actively assessed for a diagnosis of chronic primary or secondary/co-morbid insomnia.  Beneficiary must have received information on good sleep hygiene and have a documented trial (at least 3weeks) of non-pharmacological  therapies ( e.g. stimulus control, sleep restriction, sleep hygiene measures  and relaxation therapy). Length of therapy may be approved for six months one time only. Additional  information may be requested to substantiate this status. Notified Pt via: Mychart  Submission from another user

## 2022-12-22 NOTE — Telephone Encounter (Signed)
Please notify patient that it looks like they will cover up to 15/month they will not cover 30 tabs at 1 time.  We can try to resend it for 15 tabs with some refills if she would like to try processing at that way with her pharmacy. Just let me know

## 2022-12-23 ENCOUNTER — Other Ambulatory Visit: Payer: Self-pay | Admitting: Family Medicine

## 2022-12-29 ENCOUNTER — Ambulatory Visit (INDEPENDENT_AMBULATORY_CARE_PROVIDER_SITE_OTHER): Payer: Medicaid Other | Admitting: Family Medicine

## 2022-12-29 ENCOUNTER — Ambulatory Visit (INDEPENDENT_AMBULATORY_CARE_PROVIDER_SITE_OTHER): Payer: Medicaid Other

## 2022-12-29 ENCOUNTER — Encounter: Payer: Self-pay | Admitting: Family Medicine

## 2022-12-29 VITALS — BP 124/82 | HR 97 | Ht 64.0 in | Wt 208.0 lb

## 2022-12-29 DIAGNOSIS — N920 Excessive and frequent menstruation with regular cycle: Secondary | ICD-10-CM

## 2022-12-29 DIAGNOSIS — R053 Chronic cough: Secondary | ICD-10-CM | POA: Diagnosis not present

## 2022-12-29 DIAGNOSIS — R5383 Other fatigue: Secondary | ICD-10-CM | POA: Diagnosis not present

## 2022-12-29 DIAGNOSIS — R000 Tachycardia, unspecified: Secondary | ICD-10-CM

## 2022-12-29 DIAGNOSIS — D729 Disorder of white blood cells, unspecified: Secondary | ICD-10-CM | POA: Diagnosis not present

## 2022-12-29 DIAGNOSIS — R42 Dizziness and giddiness: Secondary | ICD-10-CM

## 2022-12-29 DIAGNOSIS — E119 Type 2 diabetes mellitus without complications: Secondary | ICD-10-CM

## 2022-12-29 DIAGNOSIS — E669 Obesity, unspecified: Secondary | ICD-10-CM

## 2022-12-29 DIAGNOSIS — R21 Rash and other nonspecific skin eruption: Secondary | ICD-10-CM | POA: Insufficient documentation

## 2022-12-29 DIAGNOSIS — R5382 Chronic fatigue, unspecified: Secondary | ICD-10-CM

## 2022-12-29 DIAGNOSIS — F902 Attention-deficit hyperactivity disorder, combined type: Secondary | ICD-10-CM

## 2022-12-29 HISTORY — DX: Chronic fatigue, unspecified: R53.82

## 2022-12-29 HISTORY — DX: Rash and other nonspecific skin eruption: R21

## 2022-12-29 HISTORY — DX: Obesity, unspecified: E66.9

## 2022-12-29 HISTORY — DX: Type 2 diabetes mellitus without complications: E11.9

## 2022-12-29 MED ORDER — IPRATROPIUM BROMIDE 0.03 % NA SOLN: 2.0000 | Freq: Two times a day (BID) | NASAL | 1 refills | Status: DC

## 2022-12-29 NOTE — Progress Notes (Addendum)
Established Patient Office Visit  Subjective   Patient ID: Rachael Fowler, female    DOB: 09/25/1986  Age: 37 y.o. MRN: [id number]  Chief Complaint  Patient presents with   Follow-up    4 month f/u adhd Trouble when standing up    HPI ADHD - Reports symptoms are well controlled on current regime. Denies any problems with insomnia, chest pain, palpitations, or SOB.    FAtigue -she did have a IUD removed and so has had some heavy bleeding since then she like her iron and some vitamins checked.  Has had a persistent cough since she was sick back in January.  She gets some occasional mucus but is mostly dry she takes her reflux medication daily.  She feels like the cough is more coming from her upper chest and airway.  Also since being sick she has had more frequent episodes of feeling tachycardic and like she is going to pass out when she stands up.  It has been happening almost daily for the last week.  It is definitely worse if she is coughing but happens even without that.  She noticed a couple times her oxygen was dropping when she stood up as well.    ROS    Objective:     BP 124/82 (BP Location: Left Arm, Patient Position: Sitting, Cuff Size: Normal)   Pulse 97   Ht 5\' 4"  (1.626 m)   Wt 208 lb (94.3 kg)   SpO2 99%   BMI 35.70 kg/m    Physical Exam Vitals and nursing note reviewed.  Constitutional:      Appearance: She is well-developed.  HENT:     Head: Normocephalic and atraumatic.  Cardiovascular:     Rate and Rhythm: Normal rate and regular rhythm.     Heart sounds: Normal heart sounds.  Pulmonary:     Effort: Pulmonary effort is normal.     Breath sounds: Normal breath sounds.  Skin:    General: Skin is warm and dry.  Neurological:     Mental Status: She is alert and oriented to person, place, and time.  Psychiatric:        Behavior: Behavior normal.      No results found for any visits on 12/29/22.    The ASCVD Risk score (Arnett DK, et al.,  2019) failed to calculate for the following reasons:   The 2019 ASCVD risk score is only valid for ages 13 to 54    Assessment & Plan:   Problem List Items Addressed This Visit       Endocrine   Type 2 diabetes mellitus without complication, without long-term current use of insulin ([place])    Is with endocrinology now.      Relevant Orders   Fe+TIBC+Fer   VITAMIN D 25 Hydroxy (Vit-D Deficiency, Fractures)   Magnesium   Vitamin B1   Vitamin B6   COMPLETE METABOLIC PANEL WITH GFR   B12 and Folate Panel     Other   Attention deficit hyperactivity disorder (ADHD), combined type    Currently on Vyvanse.  Blood pressures have been at goal.      Abnormal WBC count   Relevant Orders   Fe+TIBC+Fer   VITAMIN D 25 Hydroxy (Vit-D Deficiency, Fractures)   Magnesium   Vitamin B1   Vitamin B6   COMPLETE METABOLIC PANEL WITH GFR   B12 and Folate Panel   Other Visit Diagnoses     Other fatigue    -  Primary  Relevant Orders   Fe+TIBC+Fer   VITAMIN D 25 Hydroxy (Vit-D Deficiency, Fractures)   Magnesium   Vitamin B1   Vitamin B6   COMPLETE METABOLIC PANEL WITH GFR   B12 and Folate Panel   ECHOCARDIOGRAM COMPLETE   Menorrhagia with regular cycle       Relevant Orders   Fe+TIBC+Fer   VITAMIN D 25 Hydroxy (Vit-D Deficiency, Fractures)   Magnesium   Vitamin B1   Vitamin B6   COMPLETE METABOLIC PANEL WITH GFR   B12 and Folate Panel   Tachycardia       Relevant Orders   EKG 12-Lead   ECHOCARDIOGRAM COMPLETE   Chronic cough       Relevant Medications   ipratropium (ATROVENT) 0.03 % nasal spray   Other Relevant Orders   DG Chest 2 View (Completed)   Lightheadedness       Relevant Orders   ECHOCARDIOGRAM COMPLETE      Tachycardia with standing and changing position-we discussed getting an EKG today to start.  EKG today shows rate of 89 bpm, normal sinus rhythm with no acute ST-T wave changes.  Orthostatics were normal and reassuring.  Pulse ox maintained normally.  Will  get a chest x-ray and consider scheduling for echocardiogram as well.  Cough, chronic -it started after a viral illness it could be just be postinfectious cough.  Consider GERD though she is already taking medication for this, consider postnasal drip will do a trial of Atrovent.  Will go ahead and get a plain chest x-ray since the cough has been present for 2 months.  If not improving with the Atrovent nasal spray then plan to get chest CT for further workup.  Fatigue-will check for deficiencies.  Return in about 4 weeks (around 01/26/2023).    Beatrice Lecher, MD

## 2022-12-29 NOTE — Assessment & Plan Note (Signed)
Is with endocrinology now.

## 2022-12-29 NOTE — Progress Notes (Signed)
HI Rachael Fowler,  Chest x-ray looks clear which is very reassuring.  Working to try the Atrovent nasal spray and if it is not helping over the next week then let me know and we will plan to get further imaging of your chest with probably a CT.

## 2022-12-29 NOTE — Assessment & Plan Note (Signed)
Currently on Vyvanse.  Blood pressures have been at goal.

## 2022-12-30 ENCOUNTER — Encounter: Payer: Self-pay | Admitting: Family Medicine

## 2022-12-30 NOTE — Progress Notes (Signed)
Rachael Fowler, you are definitely anemic your iron and iron stores are both low.  I would definitely recommend starting an over-the-counter iron supplement if there is something you have used before that you tolerated well that is perfect.  And then we can plan to recheck your iron and ferritin level again in about 8 weeks.  Iron can be a little constipating so you may need to consider taking a stool softener with it.  Your vitamin D does look better than it did 2 years ago but it still on the low end of normal I would still recommend 25 mcg of vitamin D daily.  Magnesium is normal.  Your liver function actually looks better this time.  That is wonderful.  Vitamin B12 and folate look great.  Vitamin B1 and B6 are still pending.

## 2022-12-31 NOTE — Telephone Encounter (Signed)
Spoke with patient. She states she uses good rx and pays out of pocket for the medication and usually runs her around $20 per month.

## 2023-01-02 LAB — COMPLETE METABOLIC PANEL WITH GFR
AG Ratio: 1.8 (calc) (ref 1.0–2.5)
ALT: 21 U/L (ref 6–29)
AST: 17 U/L (ref 10–30)
Albumin: 4.2 g/dL (ref 3.6–5.1)
Alkaline phosphatase (APISO): 55 U/L (ref 31–125)
BUN: 14 mg/dL (ref 7–25)
CO2: 28 mmol/L (ref 20–32)
Calcium: 9.3 mg/dL (ref 8.6–10.2)
Chloride: 105 mmol/L (ref 98–110)
Creat: 0.56 mg/dL (ref 0.50–0.97)
Globulin: 2.3 g/dL (calc) (ref 1.9–3.7)
Glucose, Bld: 66 mg/dL (ref 65–99)
Potassium: 4.3 mmol/L (ref 3.5–5.3)
Sodium: 140 mmol/L (ref 135–146)
Total Bilirubin: 0.2 mg/dL (ref 0.2–1.2)
Total Protein: 6.5 g/dL (ref 6.1–8.1)
eGFR: 123 mL/min/{1.73_m2} (ref >=60)

## 2023-01-02 LAB — VITAMIN D 25 HYDROXY (VIT D DEFICIENCY, FRACTURES): Vit D, 25-Hydroxy: 32 ng/mL (ref 30–100)

## 2023-01-02 LAB — VITAMIN B6: Vitamin B6: 8.4 ng/mL (ref 2.1–21.7)

## 2023-01-02 LAB — IRON,TIBC AND FERRITIN PANEL
%SAT: 13 % (calc) — ABNORMAL LOW (ref 16–45)
Ferritin: 10 ng/mL — ABNORMAL LOW (ref 16–154)
Iron: 36 ug/dL — ABNORMAL LOW (ref 40–190)
TIBC: 282 mcg/dL (calc) (ref 250–450)

## 2023-01-02 LAB — VITAMIN B1: Vitamin B1 (Thiamine): 6 nmol/L — ABNORMAL LOW (ref 8–30)

## 2023-01-02 LAB — B12 AND FOLATE PANEL
Folate: 17.2 ng/mL
Vitamin B-12: 360 pg/mL (ref 200–1100)

## 2023-01-02 LAB — MAGNESIUM: Magnesium: 2.1 mg/dL (ref 1.5–2.5)

## 2023-01-02 NOTE — Progress Notes (Signed)
Hi Rachael Fowler, your vitamin B1 is also low.  So would like you to get on a supplement for that as well.  So it looks like there is 3 minerals that you are deficient on the iron, vitamin D, and vitamin B1.  Vitamin B1 is also called thiamine if you are trying to look for an over-the-counter supplement.  To start with 1 daily and again we will recheck your levels in about 8 weeks on everything and make sure that your body is absorbing it and it is improving.  This will help with your nervous system and your nerve  tissue.

## 2023-01-08 ENCOUNTER — Institutional Professional Consult (permissible substitution): Payer: Medicaid Other | Admitting: Sports Medicine

## 2023-01-13 ENCOUNTER — Other Ambulatory Visit: Payer: Self-pay | Admitting: Family Medicine

## 2023-01-13 DIAGNOSIS — F902 Attention-deficit hyperactivity disorder, combined type: Secondary | ICD-10-CM

## 2023-01-13 MED ORDER — LISDEXAMFETAMINE DIMESYLATE 50 MG PO CAPS: 50.0000 mg | ORAL_CAPSULE | Freq: Every day | ORAL | 0 refills | Status: DC

## 2023-01-15 ENCOUNTER — Ambulatory Visit (INDEPENDENT_AMBULATORY_CARE_PROVIDER_SITE_OTHER): Payer: Medicaid Other | Admitting: Family Medicine

## 2023-01-15 ENCOUNTER — Encounter: Payer: Self-pay | Admitting: Family Medicine

## 2023-01-15 VITALS — BP 118/59 | HR 98 | Ht 64.0 in | Wt 209.0 lb

## 2023-01-15 DIAGNOSIS — L239 Allergic contact dermatitis, unspecified cause: Secondary | ICD-10-CM | POA: Diagnosis not present

## 2023-01-15 DIAGNOSIS — R21 Rash and other nonspecific skin eruption: Secondary | ICD-10-CM | POA: Diagnosis not present

## 2023-01-15 MED ORDER — METHYLPREDNISOLONE ACETATE 40 MG/ML IJ SUSP
40.0000 mg | Freq: Once | INTRAMUSCULAR | Status: AC
  Administered 2023-01-15: 40 mg via INTRAMUSCULAR

## 2023-01-15 MED ORDER — METHYLPREDNISOLONE SODIUM SUCC 125 MG IJ SOLR
125.0000 mg | Freq: Once | INTRAMUSCULAR | Status: AC
  Administered 2023-01-15: 125 mg via INTRAMUSCULAR

## 2023-01-15 MED ORDER — PREDNISONE 20 MG PO TABS: ORAL_TABLET | ORAL | 0 refills | Status: AC

## 2023-01-15 NOTE — Progress Notes (Signed)
Pt reports that she was cleaning out her yard on last Wednesday and believes that there was mold and mildew.   She said that she used baby wipes when she came in from cleaning the yard and Thursday morning the itching became worse. She started using itch cream taking Pepcid q12h, Benadryl q4h, Zyrtec BID, she said that it started on her arms and legs and is now on her stomach and the itching is constant.

## 2023-01-15 NOTE — Progress Notes (Signed)
Acute Office Visit  Subjective:     Patient ID: Palestine Govan, female    DOB: 1985-11-20, 37 y.o.   MRN: [id number]  Chief Complaint  Patient presents with   Allergic Reaction    HPI Patient is in today for  Pt reports that she was cleaning out her yard on last Wednesday and believes that there was mold and mildew.  She was mostly just pulling up weeds and vines.  He did have gardening gloves on but had shorts and a T-shirt on.  The rash is mostly over her arms and legs and in the last day or 2 she has been starting to get a rash on her abdomen.   She said that she used baby wipes when she came in from cleaning the yard and Thursday morning the itching became worse. She started using itch cream taking Pepcid q12h, Benadryl q4h, Zyrtec BID, she said that it started on her arms and legs and is now on her stomach and the itching is constant.    ROS      Objective:    BP (!) 118/59   Pulse 98   Ht 5\' 4"  (1.626 m)   Wt 209 lb (94.8 kg)   LMP  (LMP Unknown)   SpO2 98%   BMI 35.87 kg/m    Physical Exam Vitals reviewed.  Constitutional:      Appearance: She is well-developed.  HENT:     Head: Normocephalic and atraumatic.  Eyes:     Conjunctiva/sclera: Conjunctivae normal.  Cardiovascular:     Rate and Rhythm: Normal rate.  Pulmonary:     Effort: Pulmonary effort is normal.  Skin:    General: Skin is dry.     Coloration: Skin is not pale.     Comments: Maculopapular rash over her lower extremities and forearms.  A few of the lesions are in a linear pattern and most of the Rashi areas are papular.  No vesicles.  Just some excoriated areas.  Neurological:     Mental Status: She is alert and oriented to person, place, and time.  Psychiatric:        Behavior: Behavior normal.     No results found for any visits on 01/15/23.      Assessment & Plan:   Problem List Items Addressed This Visit   None Visit Diagnoses     Rash    -  Primary   Relevant Medications    methylPREDNISolone sodium succinate (SOLU-MEDROL) 125 mg/2 mL injection 125 mg (Completed)   methylPREDNISolone acetate (DEPO-MEDROL) injection 40 mg (Completed)   Allergic contact dermatitis, unspecified trigger          Contact dermatitis-unclear if allergic reaction to the mold and mildew in the leaves under or possible poison ivy.  Some of the areas are in a linear fashion and the rash is papular.  She does have quite a few excoriations as well.  Will treat with IM Solu-Medrol for acute relief and Depo-Medrol for more long-term relief.  We did discuss that that treatment may not be long enough she could end up having a rebound phenomenon so I did give her some prednisone to take if needed as well.  Meds ordered this encounter  Medications   predniSONE (DELTASONE) 20 MG tablet    Sig: Take 2 tablets (40 mg total) by mouth daily with breakfast for 3 days, THEN 1 tablet (20 mg total) daily with breakfast for 3 days.    Dispense:  9  tablet    Refill:  0   methylPREDNISolone sodium succinate (SOLU-MEDROL) 125 mg/2 mL injection 125 mg   methylPREDNISolone acetate (DEPO-MEDROL) injection 40 mg    No follow-ups on file.  Beatrice Lecher, MD

## 2023-01-20 ENCOUNTER — Other Ambulatory Visit: Payer: Self-pay | Admitting: Family Medicine

## 2023-01-26 ENCOUNTER — Ambulatory Visit (INDEPENDENT_AMBULATORY_CARE_PROVIDER_SITE_OTHER): Payer: Medicaid Other | Admitting: Family Medicine

## 2023-01-26 ENCOUNTER — Encounter: Payer: Self-pay | Admitting: Family Medicine

## 2023-01-26 VITALS — BP 119/82 | HR 103 | Temp 99.0°F | Resp 18 | Ht 64.0 in | Wt 210.0 lb

## 2023-01-26 DIAGNOSIS — E611 Iron deficiency: Secondary | ICD-10-CM | POA: Diagnosis not present

## 2023-01-26 DIAGNOSIS — R053 Chronic cough: Secondary | ICD-10-CM

## 2023-01-26 DIAGNOSIS — E519 Thiamine deficiency, unspecified: Secondary | ICD-10-CM | POA: Diagnosis not present

## 2023-01-26 DIAGNOSIS — E559 Vitamin D deficiency, unspecified: Secondary | ICD-10-CM | POA: Diagnosis not present

## 2023-01-26 DIAGNOSIS — F902 Attention-deficit hyperactivity disorder, combined type: Secondary | ICD-10-CM

## 2023-01-26 MED ORDER — LISDEXAMFETAMINE DIMESYLATE 50 MG PO CAPS: 50.0000 mg | ORAL_CAPSULE | Freq: Every day | ORAL | 0 refills | Status: DC

## 2023-01-26 NOTE — Progress Notes (Signed)
Established Patient Office Visit  Subjective   Patient ID: Tomi Depass, female    DOB: January 17, 1986  Age: 37 y.o. MRN: [id number]  Chief Complaint  Patient presents with   Follow-up    Patient is here to follow up from previous office visit, Patient did not voice any concerns.    HPI  4-week follow-up.  Recent labs revealed deficiencies of iron, vitamin D, and vitamin B1.  Also here for follow-up chronic cough-already on GERD medication.  Chest x-ray was unremarkable.  Recommended trial of Atrovent nasal spray to dry up excess secretions.  Did try the nasal spray but felt like she really did not like it so stopped using it.  She has felt better since she was on prednisone recently for the rash.  The rash is much much better a few she is still has a few excoriated and irritated areas.  She did determine that it is poison ivy in her flower beds and had a neighbor help her pull it up.    ROS    Objective:     BP 119/82   Pulse (!) 103   Temp 99 F (37.2 C) (Oral)   Resp 18   Ht 5\' 4"  (1.626 m)   Wt 210 lb 0.2 oz (95.3 kg)   LMP  (LMP Unknown)   SpO2 98%   BMI 36.05 kg/m    Physical Exam Vitals and nursing note reviewed.  Constitutional:      Appearance: She is well-developed.  HENT:     Head: Normocephalic and atraumatic.  Cardiovascular:     Rate and Rhythm: Normal rate and regular rhythm.     Heart sounds: Normal heart sounds.  Pulmonary:     Effort: Pulmonary effort is normal.     Breath sounds: Normal breath sounds.  Skin:    General: Skin is warm and dry.  Neurological:     Mental Status: She is alert and oriented to person, place, and time.  Psychiatric:        Behavior: Behavior normal.      No results found for any visits on 01/26/23.    The ASCVD Risk score (Arnett DK, et al., 2019) failed to calculate for the following reasons:   The 2019 ASCVD risk score is only valid for ages 24 to 77    Assessment & Plan:   Problem List Items Addressed  This Visit       Other   Attention deficit hyperactivity disorder (ADHD), combined type    Stable on current regimen.  Refill sent for April and May.      Relevant Medications   lisdexamfetamine (VYVANSE) 50 MG capsule (Start on 02/12/2023)   lisdexamfetamine (VYVANSE) 50 MG capsule (Start on 03/13/2023)   Other Visit Diagnoses     Vitamin D deficiency    -  Primary   Relevant Orders   VITAMIN D 25 Hydroxy (Vit-D Deficiency, Fractures)   Iron deficiency       Relevant Orders   Fe+TIBC+Fer   Vitamin B1 deficiency       Relevant Orders   Vitamin B1   Chronic cough          Cough-for now seems to be improved.  Will continue to monitor and workup further if needed.  Consider CT scan if not improving.  Did discuss the mineral deficiencies that showed up on her lab work she does take a prenatal but has been on it for months before she had the blood work  done.  So we discussed getting a separate B1, vitamin D, and iron to take and we will plan to recheck those levels again in about 8 weeks.  No follow-ups on file.    Beatrice Lecher, MD

## 2023-01-26 NOTE — Assessment & Plan Note (Signed)
Stable on current regimen.  Refill sent for April and May.

## 2023-01-27 ENCOUNTER — Ambulatory Visit (INDEPENDENT_AMBULATORY_CARE_PROVIDER_SITE_OTHER): Payer: Medicaid Other | Admitting: Sports Medicine

## 2023-01-27 DIAGNOSIS — M545 Low back pain, unspecified: Secondary | ICD-10-CM

## 2023-01-27 DIAGNOSIS — M2550 Pain in unspecified joint: Secondary | ICD-10-CM

## 2023-01-27 DIAGNOSIS — G8929 Other chronic pain: Secondary | ICD-10-CM

## 2023-01-27 DIAGNOSIS — G629 Polyneuropathy, unspecified: Secondary | ICD-10-CM | POA: Diagnosis not present

## 2023-01-27 DIAGNOSIS — M249 Joint derangement, unspecified: Secondary | ICD-10-CM

## 2023-01-27 MED ORDER — PREGABALIN 100 MG PO CAPS
100.0000 mg | ORAL_CAPSULE | Freq: Two times a day (BID) | ORAL | 3 refills | Status: DC
Start: 2023-01-27 — End: 2023-11-05

## 2023-01-27 NOTE — Assessment & Plan Note (Signed)
Rachael Fowler is concerned about joint hypermobility, she has widespread aches and pains. It sounds like she had an Ehlers-Danlos genetic test that was negative, she had a Beighton score of 3 today which is below the threshold to diagnose a benign joint hypermobility syndrome.

## 2023-01-27 NOTE — Progress Notes (Signed)
    Procedures performed today:    None.  Independent interpretation of notes and tests performed by another provider:   None.  Brief History, Exam, Impression, and Recommendations:    No evidence of joint hypermobility syndrome Lexine Baton is concerned about joint hypermobility, she has widespread aches and pains. It sounds like she had an Ehlers-Danlos genetic test that was negative, she had a Beighton score of 3 today which is below the threshold to diagnose a benign joint hypermobility syndrome.  Small fiber neuropathy He does have polyarthralgias, both wrists and hands. Imaging studies including MRI have been negative. She had upper extremity nerve conduction and EMG that was negative. Suspected to be small fiber neuropathy with neurology. Diabetic peripheral neuropathy is also in the differential here. She was on Lyrica which seemed to help to some degree. She does feel better when on steroids. We will do her rheumatoid testing again if anything comes back positive then she would qualify for rheumatology consult although at this point I do not think she has any rheumatic disease. I think her symptoms are most likely diabetic peripheral neuropathy and the key here is going to be aggressive medical management. I will increase her Lyrica to 100 mg twice daily, she has still been doing 50. I would also recommend a steady up titration to the max tolerable dose.    ____________________________________________ Gwen Her. Dianah Field, M.D., ABFM., CAQSM., AME. Primary Care and Sports Medicine [facility]  Adjunct Professor of [employer] of Medicine  Risk manager

## 2023-01-27 NOTE — Assessment & Plan Note (Addendum)
He does have polyarthralgias, both wrists and hands. Imaging studies including MRI have been negative. She had upper extremity nerve conduction and EMG that was negative. Suspected to be small fiber neuropathy with neurology. Diabetic peripheral neuropathy is also in the differential here. She was on Lyrica which seemed to help to some degree. She does feel better when on steroids. We will do her rheumatoid testing again if anything comes back positive then she would qualify for rheumatology consult although at this point I do not think she has any rheumatic disease. I think her symptoms are most likely diabetic peripheral neuropathy and the key here is going to be aggressive medical management. I will increase her Lyrica to 100 mg twice daily, she has still been doing 50. I would also recommend a steady up titration to the max tolerable dose.

## 2023-01-31 LAB — COMPREHENSIVE METABOLIC PANEL
AG Ratio: 1.8 (calc) (ref 1.0–2.5)
ALT: 24 U/L (ref 6–29)
AST: 21 U/L (ref 10–30)
Albumin: 4.3 g/dL (ref 3.6–5.1)
Alkaline phosphatase (APISO): 56 U/L (ref 31–125)
BUN: 17 mg/dL (ref 7–25)
CO2: 29 mmol/L (ref 20–32)
Calcium: 9.6 mg/dL (ref 8.6–10.2)
Chloride: 104 mmol/L (ref 98–110)
Creat: 0.58 mg/dL (ref 0.50–0.97)
Globulin: 2.4 g/dL (calc) (ref 1.9–3.7)
Glucose, Bld: 74 mg/dL (ref 65–99)
Potassium: 4.6 mmol/L (ref 3.5–5.3)
Sodium: 141 mmol/L (ref 135–146)
Total Bilirubin: 0.3 mg/dL (ref 0.2–1.2)
Total Protein: 6.7 g/dL (ref 6.1–8.1)

## 2023-01-31 LAB — SEDIMENTATION RATE: Sed Rate: 11 mm/h (ref 0–20)

## 2023-01-31 LAB — CBC WITH DIFFERENTIAL/PLATELET
Absolute Monocytes: 907 cells/uL (ref 200–950)
Basophils Absolute: 158 cells/uL (ref 0–200)
Basophils Relative: 1.1 %
Eosinophils Absolute: 389 cells/uL (ref 15–500)
Eosinophils Relative: 2.7 %
HCT: 39.8 % (ref 35.0–45.0)
Hemoglobin: 13.2 g/dL (ref 11.7–15.5)
Lymphs Abs: 3801.6 cells/uL (ref 850–3900)
MCH: 29 pg (ref 27.0–33.0)
MCHC: 33.2 g/dL (ref 32.0–36.0)
MCV: 87.5 fL (ref 80.0–100.0)
MPV: 9.4 fL (ref 7.5–12.5)
Monocytes Relative: 6.3 %
Neutro Abs: 9144 cells/uL — ABNORMAL HIGH (ref 1500–7800)
Neutrophils Relative %: 63.5 %
Platelets: 554 10*3/uL — ABNORMAL HIGH (ref 140–400)
RBC: 4.55 10*6/uL (ref 3.80–5.10)
RDW: 13.4 % (ref 11.0–15.0)
Total Lymphocyte: 26.4 %
WBC: 14.4 10*3/uL — ABNORMAL HIGH (ref 3.8–10.8)

## 2023-01-31 LAB — RHEUMATOID ARTHRITIS DIAGNOSTIC PANEL, COMPREHENSIVE
Cyclic Citrullin Peptide Ab: <16 Units (ref <20)
Rheumatoid Factor (IgA): <5 U (ref <=6)
Rheumatoid Factor (IgG): <5 U (ref <=6)
Rheumatoid Factor (IgM): <5 U (ref <=6)
SSA (Ro) (ENA) Antibody, IgG: <1 AI
SSB (La) (ENA) Antibody, IgG: <1 AI

## 2023-01-31 LAB — URIC ACID: Uric Acid, Serum: 4.9 mg/dL (ref 2.5–7.0)

## 2023-01-31 LAB — CK: Total CK: 78 U/L (ref 29–143)

## 2023-03-09 ENCOUNTER — Other Ambulatory Visit: Payer: Self-pay | Admitting: Family Medicine

## 2023-04-03 ENCOUNTER — Other Ambulatory Visit: Payer: Self-pay | Admitting: Family Medicine

## 2023-04-21 LAB — VITAMIN D 25 HYDROXY (VIT D DEFICIENCY, FRACTURES): Vit D, 25-Hydroxy: 35 ng/mL (ref 30–100)

## 2023-04-21 LAB — IRON,TIBC AND FERRITIN PANEL
%SAT: 18 % (calc) (ref 16–45)
Ferritin: 6 ng/mL — ABNORMAL LOW (ref 16–154)
Iron: 61 ug/dL (ref 40–190)
TIBC: 331 mcg/dL (calc) (ref 250–450)

## 2023-04-21 LAB — VITAMIN B1: Vitamin B1 (Thiamine): 17 nmol/L (ref 8–30)

## 2023-04-22 NOTE — Progress Notes (Signed)
Hi Rachael Fowler, your total iron looks much better it was 36 it is up to 61 so we are making some progress.  Your ferritin still low though so we have a little ways to go.  I want to get that ferritin all the way up to 40.  So please continue with the extra iron supplementation.  Also your vitamin B1 looks great!  It is back to normal which is fantastic.  I would recommend taking extra B1 for another 2 to 3 months and and then you can discontinue at that point.  Your vitamin D is also back to normal.  But I would recommend continuing with 25 mcg / 1000 IU daily just for maintenance therapy.

## 2023-05-04 ENCOUNTER — Telehealth: Payer: Self-pay | Admitting: General Practice

## 2023-05-04 NOTE — Transitions of Care (Post Inpatient/ED Visit) (Signed)
   05/04/2023  Name: Rachael Fowler MRN: [id number] DOB: [birth year]  Today's TOC FU Call Status: Today's TOC FU Call Status:: Unsuccessul Call (1st Attempt) Unsuccessful Call (1st Attempt) Date: 05/04/23  Attempted to reach the patient regarding the most recent Inpatient/ED visit.  Follow Up Plan: Additional outreach attempts will be made to reach the patient to complete the Transitions of Care (Post Inpatient/ED visit) call.   Signature Modesto Charon, Control and instrumentation engineer

## 2023-05-05 NOTE — Transitions of Care (Post Inpatient/ED Visit) (Signed)
   05/05/2023  Name: Rachael Fowler MRN: [id number] DOB: 24-Aug-1986  Today's TOC FU Call Status: Today's TOC FU Call Status:: Unsuccessful Call (2nd Attempt) Unsuccessful Call (1st Attempt) Date: 05/04/23 Unsuccessful Call (2nd Attempt) Date: 05/05/23  Attempted to reach the patient regarding the most recent Inpatient/ED visit.  Follow Up Plan: Additional outreach attempts will be made to reach the patient to complete the Transitions of Care (Post Inpatient/ED visit) call.   Signature [place]esto Charon, Control and instrumentation engineer

## 2023-06-02 ENCOUNTER — Ambulatory Visit (INDEPENDENT_AMBULATORY_CARE_PROVIDER_SITE_OTHER): Payer: Medicaid Other | Admitting: Family Medicine

## 2023-06-02 ENCOUNTER — Ambulatory Visit: Payer: Medicaid Other | Admitting: Family Medicine

## 2023-06-02 VITALS — BP 121/86 | HR 101 | Wt 220.0 lb

## 2023-06-02 DIAGNOSIS — R21 Rash and other nonspecific skin eruption: Secondary | ICD-10-CM

## 2023-06-02 DIAGNOSIS — R002 Palpitations: Secondary | ICD-10-CM

## 2023-06-02 DIAGNOSIS — H9201 Otalgia, right ear: Secondary | ICD-10-CM

## 2023-06-02 DIAGNOSIS — F902 Attention-deficit hyperactivity disorder, combined type: Secondary | ICD-10-CM | POA: Diagnosis not present

## 2023-06-02 DIAGNOSIS — J029 Acute pharyngitis, unspecified: Secondary | ICD-10-CM | POA: Diagnosis not present

## 2023-06-02 DIAGNOSIS — R42 Dizziness and giddiness: Secondary | ICD-10-CM

## 2023-06-02 DIAGNOSIS — M2550 Pain in unspecified joint: Secondary | ICD-10-CM

## 2023-06-02 MED ORDER — FLUOXETINE HCL 10 MG PO CAPS: ORAL_CAPSULE | ORAL | 1 refills | Status: DC

## 2023-06-02 MED ORDER — LISDEXAMFETAMINE DIMESYLATE 50 MG PO CAPS
50.0000 mg | ORAL_CAPSULE | Freq: Every day | ORAL | 0 refills | Status: DC
Start: 2023-06-02 — End: 2023-08-21

## 2023-06-02 MED ORDER — CLOBETASOL PROPIONATE 0.05 % EX CREA: 1.0000 | TOPICAL_CREAM | Freq: Two times a day (BID) | CUTANEOUS | 0 refills | Status: DC

## 2023-06-02 MED ORDER — LISDEXAMFETAMINE DIMESYLATE 50 MG PO CAPS
50.0000 mg | ORAL_CAPSULE | Freq: Every day | ORAL | 0 refills | Status: DC
Start: 2023-07-31 — End: 2024-03-09

## 2023-06-02 MED ORDER — LISDEXAMFETAMINE DIMESYLATE 50 MG PO CAPS
50.0000 mg | ORAL_CAPSULE | Freq: Every day | ORAL | 0 refills | Status: DC
Start: 2023-07-02 — End: 2024-03-09

## 2023-06-02 NOTE — Assessment & Plan Note (Signed)
Unfortunately the echocardiogram that was ordered back in March was never performed she never got a call.  She is willing to drive to our [facility] location where we order them more consistently so we will go ahead and place a new order today.  I also like to do a cardiology consult.

## 2023-06-02 NOTE — Patient Instructions (Signed)
OK to decrease the lexapro to 10 mg daily for 4-6 days and then down to 5 mg daily for 4-6 days and then stop and start the fluoxetine.  Continue with quetiapine

## 2023-06-02 NOTE — Assessment & Plan Note (Signed)
Well controlled. Continue current regimen. Follow up in  6 mo  

## 2023-06-02 NOTE — Progress Notes (Signed)
Established Patient Office Visit  Subjective   Patient ID: Rachael Fowler, female    DOB: 02-19-1986  Age: 37 y.o. MRN: [id number]  Chief Complaint  Patient presents with   Rash   Tachycardia    HPI   She was just seen her on July 10th for otitis media she was treated with Omnicef and some prednisone.  She would like me to recheck her ears today.  She says she felt much better after the treatment but is starting to get a little bit of a right-sided sore throat and some right ear pain in the last day or 2.  She also wanted to let me know that she is been dealing with a rash behind her left knee for a while.  Has been struggling with her depression more lately.  Has been more irritable.  And just more down and not feeling motivated wanting to sleep and more.  He is currently taking Lexapro 15 mg daily.  No thoughts of wanting to harm herself.  PHQ-9 score of 20.     06/02/2023    4:38 PM 01/26/2023    2:06 PM 01/26/2023    1:45 PM 12/29/2022    1:51 PM  GAD 7 : Generalized Anxiety Score  Nervous, Anxious, on Edge 2 2 1 1   Control/stop worrying 1 1 1 1   Worry too much - different things 1 1 1 1   Trouble relaxing 3 2 1 2   Restless 1 1 0 1  Easily annoyed or irritable 3 3 0 3  Afraid - awful might happen 1 1 1  0  Total GAD 7 Score 12 11 5 9   Anxiety Difficulty Very difficult  Somewhat difficult Very difficult      ADD - Reports symptoms are well controlled on current regime. Denies any problems with insomnia, chest pain, palpitations, or SOB.  He is starting classes this fall again.  She is working on classes for a business degree.     ROS    Objective:     BP 121/86   Pulse (!) 101   Wt 220 lb (99.8 kg)   SpO2 96%   BMI 37.76 kg/m    Physical Exam Constitutional:      Appearance: She is well-developed.  HENT:     Head: Normocephalic and atraumatic.     Right Ear: Tympanic membrane, ear canal and external ear normal.     Left Ear: Tympanic membrane, ear canal and  external ear normal.     Nose: Nose normal.     Mouth/Throat:     Pharynx: Oropharynx is clear.  Eyes:     Conjunctiva/sclera: Conjunctivae normal.     Pupils: Pupils are equal, round, and reactive to light.  Neck:     Thyroid: No thyromegaly.  Cardiovascular:     Rate and Rhythm: Normal rate and regular rhythm.     Heart sounds: Normal heart sounds.  Pulmonary:     Effort: Pulmonary effort is normal.     Breath sounds: Normal breath sounds. No wheezing.  Musculoskeletal:     Cervical back: Neck supple.  Lymphadenopathy:     Cervical: No cervical adenopathy.  Skin:    General: Skin is warm and dry.     Comments: Erythematous macular rash with very fine scale on her left posterior leg near her knee.  No central clearing.  Approx 2 x 0.5 inches, and a 2nd smaller lesion   Neurological:     Mental Status: She is alert  and oriented to person, place, and time.      No results found for any visits on 06/02/23.    The ASCVD Risk score (Arnett DK, et al., 2019) failed to calculate for the following reasons:   The 2019 ASCVD risk score is only valid for ages 43 to 48    Assessment & Plan:   Problem List Items Addressed This Visit       Other   Palpitations    Unfortunately the echocardiogram that was ordered back in March was never performed she never got a call.  She is willing to drive to our [facility] location where we order them more consistently so we will go ahead and place a new order today.  I also like to do a cardiology consult.      Relevant Orders   Ambulatory referral to Cardiology   Attention deficit hyperactivity disorder (ADHD), combined type    Well controlled. Continue current regimen. Follow up in  25mo       Relevant Medications   lisdexamfetamine (VYVANSE) 50 MG capsule   lisdexamfetamine (VYVANSE) 50 MG capsule (Start on 07/02/2023)   lisdexamfetamine (VYVANSE) 50 MG capsule (Start on 07/31/2023)   Other Visit Diagnoses     Sore throat    -   Primary   Right ear pain       Rash       Lightheadedness       Relevant Orders   ECHOCARDIOGRAM COMPLETE   Ambulatory referral to Cardiology   Polyarthralgia       Relevant Orders   Ambulatory referral to Orthopedic Surgery      Sore throat and right ear pain-exam is normal today.  If she develops worsening symptoms then please let us know.  Rash --Will treat with topical steroid cream it looks most consistent with eczema.  If not improving then please let me know.  No follow-ups on file.    Nani Gasser, MD

## 2023-06-20 ENCOUNTER — Other Ambulatory Visit: Payer: Self-pay | Admitting: Family Medicine

## 2023-07-01 ENCOUNTER — Other Ambulatory Visit: Payer: Self-pay | Admitting: Family Medicine

## 2023-07-22 ENCOUNTER — Ambulatory Visit: Payer: Medicaid Other | Admitting: Cardiology

## 2023-08-21 ENCOUNTER — Other Ambulatory Visit: Payer: Self-pay | Admitting: Family Medicine

## 2023-08-21 DIAGNOSIS — F902 Attention-deficit hyperactivity disorder, combined type: Secondary | ICD-10-CM

## 2023-08-25 MED ORDER — LISDEXAMFETAMINE DIMESYLATE 50 MG PO CAPS
50.0000 mg | ORAL_CAPSULE | Freq: Every day | ORAL | 0 refills | Status: DC
Start: 2023-08-27 — End: 2023-11-26

## 2023-08-31 ENCOUNTER — Encounter: Payer: Self-pay | Admitting: Family Medicine

## 2023-08-31 ENCOUNTER — Ambulatory Visit (INDEPENDENT_AMBULATORY_CARE_PROVIDER_SITE_OTHER): Payer: Medicaid Other | Admitting: Family Medicine

## 2023-08-31 VITALS — BP 115/89 | HR 98 | Temp 98.6°F

## 2023-08-31 DIAGNOSIS — H9202 Otalgia, left ear: Secondary | ICD-10-CM | POA: Diagnosis not present

## 2023-08-31 DIAGNOSIS — R052 Subacute cough: Secondary | ICD-10-CM | POA: Diagnosis not present

## 2023-08-31 MED ORDER — SULFAMETHOXAZOLE-TRIMETHOPRIM 800-160 MG PO TABS: 1.0000 | ORAL_TABLET | Freq: Two times a day (BID) | ORAL | 0 refills | Status: DC

## 2023-08-31 NOTE — Progress Notes (Unsigned)
   Acute Office Visit  Subjective:     Patient ID: Rachael Fowler, female    DOB: 12-30-1985, 37 y.o.   MRN: [id number]  No chief complaint on file.   HPI Patient is in today for sick x 1.5 weeks.  She says her nasal congestion is a little better but her cough is worse and now having pressure in her left ear.  No fever or chills.    ROS      Objective:    BP 115/89   Pulse 98   Temp 98.6 F (37 C)   SpO2 98%    Physical Exam Constitutional:      Appearance: Normal appearance.  HENT:     Head: Normocephalic and atraumatic.     Right Ear: Tympanic membrane, ear canal and external ear normal. There is no impacted cerumen.     Left Ear: Tympanic membrane, ear canal and external ear normal. There is no impacted cerumen.     Nose: Nose normal.     Mouth/Throat:     Pharynx: Oropharynx is clear.  Eyes:     Conjunctiva/sclera: Conjunctivae normal.  Neck:     Comments: Small left ant cerv LN at edge of jaw just below ear Cardiovascular:     Rate and Rhythm: Normal rate and regular rhythm.  Pulmonary:     Effort: Pulmonary effort is normal.     Breath sounds: Normal breath sounds.  Musculoskeletal:     Cervical back: Neck supple. No tenderness.  Lymphadenopathy:     Cervical: Cervical adenopathy present.  Skin:    General: Skin is warm and dry.  Neurological:     Mental Status: She is alert and oriented to person, place, and time.  Psychiatric:        Mood and Affect: Mood normal.     No results found for any visits on 08/31/23.      Assessment & Plan:   Problem List Items Addressed This Visit   None Visit Diagnoses     Left ear pain    -  Primary   Relevant Medications   sulfamethoxazole-trimethoprim (BACTRIM DS) 800-160 MG tablet   Subacute cough       Relevant Medications   sulfamethoxazole-trimethoprim (BACTRIM DS) 800-160 MG tablet      Some sxs improved and some are worse. Suspect post infectious cough.  Patient very concerned she was feeling worse  so will tx with Bactrim DS. PCN allergy .   Meds ordered this encounter  Medications   sulfamethoxazole-trimethoprim (BACTRIM DS) 800-160 MG tablet    Sig: Take 1 tablet by mouth 2 (two) times daily.    Dispense:  14 tablet    Refill:  0    No follow-ups on file.  Nani Gasser, MD

## 2023-09-01 ENCOUNTER — Encounter: Payer: Self-pay | Admitting: Family Medicine

## 2023-09-01 ENCOUNTER — Telehealth: Payer: Medicaid Other | Admitting: Family Medicine

## 2023-09-04 ENCOUNTER — Other Ambulatory Visit: Payer: Self-pay

## 2023-09-04 DIAGNOSIS — E282 Polycystic ovarian syndrome: Secondary | ICD-10-CM | POA: Insufficient documentation

## 2023-09-04 DIAGNOSIS — J45909 Unspecified asthma, uncomplicated: Secondary | ICD-10-CM | POA: Insufficient documentation

## 2023-09-04 DIAGNOSIS — I10 Essential (primary) hypertension: Secondary | ICD-10-CM | POA: Insufficient documentation

## 2023-09-04 DIAGNOSIS — E119 Type 2 diabetes mellitus without complications: Secondary | ICD-10-CM | POA: Insufficient documentation

## 2023-09-04 DIAGNOSIS — F32A Depression, unspecified: Secondary | ICD-10-CM | POA: Insufficient documentation

## 2023-09-07 ENCOUNTER — Ambulatory Visit: Payer: Medicaid Other | Attending: Cardiology

## 2023-09-07 VITALS — BP 112/75 | HR 108 | Ht 64.0 in | Wt 208.0 lb

## 2023-09-07 DIAGNOSIS — I10 Essential (primary) hypertension: Secondary | ICD-10-CM | POA: Diagnosis not present

## 2023-09-07 DIAGNOSIS — Z720 Tobacco use: Secondary | ICD-10-CM

## 2023-09-07 DIAGNOSIS — R002 Palpitations: Secondary | ICD-10-CM

## 2023-09-07 NOTE — Progress Notes (Signed)
Cardiology Consultation:    Date:  09/07/2023   ID:  Rachael Fowler, DOB 09-13-1986, MRN [id number]  PCP:  Rachael Games, MD  Cardiologist:  Rachael Murphy, MD   Referring MD: Rachael Fowler, *   No chief complaint on file.    ASSESSMENT AND PLAN:   Rachael Fowler 37 year old woman with a history of diabetes mellitus, obesity, asthma, dyslipidemia, hypertension, depression, smokes cigarettes, ADHD, daily caffeinated soda intake, presents for further evaluation of symptoms of palpitations associated with minimal exertion.  Problem List Items Addressed This Visit     Palpitations - Primary    Palpitations seem to be associated with activity. Elevated heart rates. Event monitor from last year noted predominantly sinus tachycardia with activity associated with her symptoms.  Likely related to inappropriate sinus tachycardia [average heart rate was 90 bpm on the event monitor March 2023] versus postural orthostatic tachycardia syndrome versus dysautonomia in the setting of her longstanding history of diabetes mellitus,  Recommended to keep herself well-hydrated. Advised her to avoid stimulants. She will be unable to discontinue Vyvanse given her ADHD symptoms. Advised her to discontinue caffeine intake and wean herself off caffeinated sodas.  Advised her to keep herself well-hydrated drink plenty of fluids through the day.  Advised her to do exercises which she can do sitting or supine in order to help her cardiac conditioning.  Will obtain a transthoracic echocardiogram to exclude any significant underlying cardiac structural or functional issues.  Continue with metoprolol succinate 25 mg once daily.       Relevant Orders   EKG 12-Lead (Completed)   ECHOCARDIOGRAM COMPLETE   Tobacco use    Discussed harmful effects of smoking and strongly recommended to quit.        Hypertension    Well-controlled. In fact her blood pressures are soft. She is currently  on lisinopril 10 mg once daily for renal protection in the setting of diabetes and hypertension. Advised her to keep herself well-hydrated. If blood pressures continue to run low, should consider discontinuing lisinopril.  Will obtain transthoracic echocardiogram to rule out any cardiac structural or functional issues.        Relevant Orders   ECHOCARDIOGRAM COMPLETE   Return to clinic in 6 months, earlier if needed.   History of Present Illness:    Rachael Fowler is a 37 y.o. female who is being seen today for the evaluation of  at the request of Rachael Fowler, *.   Previously followed up with Dr. Leeann Fowler at [facility], last visit December 2018 for chest pain and palpitations around the time she was pregnant and workup at the time was unremarkable and she had uneventful pregnancy from cardiac standpoint.  She has history of diabetes mellitus diagnosed in 2018, obese, asthma, dyslipidemia, hypertension, depression, smokes cigarettes [cutting down in 1 to 2 cigarettes a day], ADHD.  Mentions she has had elevated heart rates with minimal activity for multiple years.  Mentions no symptoms at rest but as soon as she starts doing any activity she feels her heart races get elevated.  Able to document heart rates using her smart watch.  Notes resting heart rate around 70s to 80s.  With activity his heart rate can go up to 180 bpm.  She denies any syncopal episodes but does report coming close to passing out on few occasions with elevated heart rates during activity.  Denies any shortness of breath, orthopnea, paroxysmal nocturnal dyspnea at baseline and with minimal day-to-day activities.  She  feels her symptoms are related to POTS and has been trying to manage conservatively. PCP recently started metoprolol succinate 25 mg once daily and she has been taking this consistently and feels it has helped to an extent.  She lives at home with her mother, son and fianc.  She is currently  unemployed while working previously at her job she was able to do her work while seated but any amount of activity walking she reported this.  Smokes 1 to 2 cigarettes a day Does not drink alcohol. Does not use marijuana or heroin or cocaine. Does not drink coffee. Drinks up to a bottle of caffeinated soda a day, attempts at quitting previously were unsuccessful due to headaches.  She takes Vyvanse once a day to help with her ADHD symptoms. She has been on lisinopril 10 mg consistently for blood pressure and for renal protection in the setting of diabetes. EKG in the clinic today shows sinus rhythm heart rate 95/min, PR interval 170 ms, QRS duration 78 ms.  Last heart monitor 14 days from March 2023 was reassuring, with average heart rate 90/min [60 bpm to 176 bpm, with rare ventricular or supraventricular ectopy [less than 1%].  There were 56 total triggered tracings mostly correlated with sinus rhythm and tachycardia and occasionally with ventricular and supraventricular ectopic beats.  Exercise stress echocardiogram from January 2018 at [facility], was on treadmill for 4 minutes, heart rate attained 191 bpm 87% of MPHR, no.  No evidence of ischemia.  She was able to show the report on her phone.   Past Medical History:  Diagnosis Date   Abnormal WBC count 05/26/2018   Anxiety and depression 05/26/2018   Asthma    Attention deficit hyperactivity disorder (ADHD), combined type 10/17/2021   Chronic bilateral low back pain without sciatica 05/20/2022   Chronic fatigue, unspecified 12/29/2022   Chronic hypertension 03/12/2021   Depression and anxiety    Diabetes mellitus (HCC) 12/29/2022   Diagnosis right around time of +UPT     Diabetes mellitus without complication (HCC)    Hirsutism 05/26/2018   Hypertension    Itching 12/18/2021   Lumbar spondylosis 08/17/2018   Migraines 05/26/2018   Moderate persistent asthma with exacerbation 02/05/2018   No evidence of joint hypermobility  syndrome 01/15/2022   Genetic testing was negative through Gene dx     Obesity 12/29/2022   Palpitations 01/14/2022   Patellofemoral syndrome, bilateral 08/17/2018   PCOS (polycystic ovarian syndrome)    Psychophysiological insomnia 05/26/2018   Rash and other nonspecific skin eruption 12/29/2022   S/P primary low transverse C-section 05/18/2018   Small fiber neuropathy 10/17/2021   Diagnosed by neurology in 2022.     Tobacco use 05/18/2018   Type 2 diabetes mellitus without complication, without long-term current use of insulin (HCC) 08/19/2017    Past Surgical History:  Procedure Laterality Date   CESAREAN SECTION     KNEE SURGERY  04/20/2006   x2    Current Medications: Current Meds  Medication Sig   ACCU-CHEK GUIDE test strip Use as directed   albuterol (VENTOLIN HFA) 108 (90 Base) MCG/ACT inhaler Inhale 2 puffs into the lungs every 6 (six) hours as needed for wheezing or shortness of breath.   Blood Glucose Monitoring Suppl (ACCU-CHEK AVIVA PLUS) w/Device KIT Accu-Chek Aviva Plus Meter  USE AS DIRECTED   cetirizine (ZYRTEC) 10 MG tablet Take 10 mg by mouth 2 (two) times daily.   clobetasol cream (TEMOVATE) 0.05 % Apply 1 Application topically 2 (  two) times daily.   EPIPEN 2-PAK 0.3 MG/0.3ML SOAJ injection Inject 0.3 mg into the muscle as needed for anaphylaxis.   FLUoxetine (PROZAC) 20 MG capsule Take 1 capsule (20 mg total) by mouth daily.   lisdexamfetamine (VYVANSE) 50 MG capsule Take 1 capsule (50 mg total) by mouth daily.   lisdexamfetamine (VYVANSE) 50 MG capsule Take 1 capsule (50 mg total) by mouth daily.   lisdexamfetamine (VYVANSE) 50 MG capsule Take 1 capsule (50 mg total) by mouth daily.   lisinopril (ZESTRIL) 10 MG tablet Take 1 tablet by mouth daily.   meloxicam (MOBIC) 15 MG tablet TAKE 1 TABLET BY MOUTH EVERY DAY AS NEEDED   metFORMIN (GLUCOPHAGE-XR) 500 MG 24 hr tablet Take 1,000 mg by mouth 2 (two) times daily with a meal.   metoprolol succinate  (TOPROL-XL) 25 MG 24 hr tablet TAKE 1 TABLET (25 MG TOTAL) BY MOUTH DAILY.   omeprazole (PRILOSEC) 10 MG capsule TAKE 1 CAPSULE BY MOUTH EVERY DAY   pregabalin (LYRICA) 100 MG capsule Take 1 capsule (100 mg total) by mouth 2 (two) times daily.   QUEtiapine (SEROQUEL) 50 MG tablet TAKE 1 TABLET BY MOUTH EVERYDAY AT BEDTIME   simvastatin (ZOCOR) 10 MG tablet Take 10 mg by mouth daily at 6 PM.   TRULICITY 3 MG/0.5ML SOPN Inject 3 mg into the skin once a week.     Allergies:   Gabapentin, Hydrocodone, Amphetamine-dextroamphetamine, Penicillins, Aripiprazole, Montelukast, and Zolpidem   Social History   Socioeconomic History   Marital status: Single    Spouse name: Not on file   Number of children: Not on file   Years of education: Not on file   Highest education level: Not on file  Occupational History   Not on file  Tobacco Use   Smoking status: Some Days    Current packs/day: 0.00    Average packs/day: 0.5 packs/day for 15.2 years (7.6 ttl pk-yrs)    Types: Cigarettes, Cigars    Start date: 10/27/2004    Last attempt to quit: 01/22/2020    Years since quitting: 3.6   Smokeless tobacco: Never  Vaping Use   Vaping status: Never Used  Substance and Sexual Activity   Alcohol use: Not Currently    Comment: rarely   Drug use: Never   Sexual activity: Yes    Partners: Male  Other Topics Concern   Not on file  Social History Narrative   Not on file   Social Determinants of Health   Financial Resource Strain: Not on file  Food Insecurity: No Food Insecurity (06/04/2022)   Received from [facility], [facility]   Hunger Vital Sign    Worried About Running Out of Food in the Last Year: Never true    Ran Out of Food in the Last Year: Never true  Transportation Needs: Not on file  Physical Activity: Not on file  Stress: No Stress Concern Present (04/11/2021)   Received from [facility], [facility]   [employer] of Occupational Health - Occupational Stress Questionnaire     Feeling of Stress : Not at all  Social Connections: Unknown (02/24/2022)   Received from [facility], [facility]   Social Network    Social Network: Not on file     Family History: The patient's family history includes Cancer in her maternal grandmother and maternal uncle; Depression in her mother; Diabetes in her maternal uncle and mother; Hyperlipidemia in her father, maternal grandmother, mother, and paternal grandmother; Hypertension in her father,  maternal grandmother, and mother. There is no history of Colon cancer or Breast cancer. ROS:   Please see the history of present illness.    All 14 point review of systems negative except as described per history of present illness.  EKGs/Labs/Other Studies Reviewed:    The following studies were reviewed today:   EKG:  EKG Interpretation Date/Time:  Monday September 07 2023 15:36:49 EST Ventricular Rate:  95 PR Interval:  170 QRS Duration:  78 QT Interval:  342 QTC Calculation: 429 R Axis:   77  Text Interpretation: Normal sinus rhythm Low voltage QRS Cannot rule out Anterior infarct , age undetermined No previous ECGs available Confirmed by Huntley Dec reddy [phone]) on 09/07/2023 3:59:44 PM    Recent Labs: 12/29/2022: Magnesium 2.1 01/27/2023: ALT 24; BUN 17; Creat 0.58; Hemoglobin 13.2; Platelets 554; Potassium 4.6; Sodium 141  Recent Lipid Panel    Component Value Date/Time   CHOL 214 (H) 07/14/2018 1133   TRIG 337 (H) 07/14/2018 1133   HDL 47 (L) 07/14/2018 1133   CHOLHDL 4.6 07/14/2018 1133   LDLCALC 119 (H) 07/14/2018 1133    Physical Exam:    VS:  BP 112/75 (BP Location: Left Arm, Patient Position: Standing, Cuff Size: Normal)   Pulse (!) 108   Ht 5\' 4"  (1.626 m)   Wt 208 lb (94.3 kg)   SpO2 95%   BMI 35.70 kg/m     Wt Readings from Last 3 Encounters:  09/07/23 208 lb (94.3 kg)  06/02/23 220 lb (99.8 kg)  01/26/23 210 lb 0.2 oz (95.3 kg)     GENERAL:  Well nourished, well developed in no acute  distress NECK: No JVD; No carotid bruits CARDIAC: RRR, S1 and S2 present, no murmurs, no rubs, no gallops CHEST:  Clear to auscultation without rales, wheezing or rhonchi  Extremities: No pitting pedal edema. Pulses bilaterally symmetric with radial 2+ and dorsalis pedis 2+ NEUROLOGIC:  Alert and oriented x 3  Medication Adjustments/Labs and Tests Ordered: Current medicines are reviewed at length with the patient today.  Concerns regarding medicines are outlined above.  Orders Placed This Encounter  Procedures   EKG 12-Lead   ECHOCARDIOGRAM COMPLETE   No orders of the defined types were placed in this encounter.   Signed, Cecille Amsterdam, MD, MPH, [facility]. 09/07/2023 4:33 PM    [facility]

## 2023-09-07 NOTE — Assessment & Plan Note (Addendum)
Well-controlled. In fact her blood pressures are soft. She is currently on lisinopril 10 mg once daily for renal protection in the setting of diabetes and hypertension. Advised her to keep herself well-hydrated. If blood pressures continue to run low, should consider discontinuing lisinopril.  Will obtain transthoracic echocardiogram to rule out any cardiac structural or functional issues.

## 2023-09-07 NOTE — Patient Instructions (Signed)
Medication Instructions:  Your physician recommends that you continue on your current medications as directed. Please refer to the Current Medication list given to you today.  *If you need a refill on your cardiac medications before your next appointment, please call your pharmacy*   Lab Work: None ordered If you have labs (blood work) drawn today and your tests are completely normal, you will receive your results only by: MyChart Message (if you have MyChart) OR A paper copy in the mail If you have any lab test that is abnormal or we need to change your treatment, we will call you to review the results.   Testing/Procedures: Your physician has requested that you have an echocardiogram. Echocardiography is a painless test that uses sound waves to create images of your heart. It provides your doctor with information about the size and shape of your heart and how well your heart's chambers and valves are working. This procedure takes approximately one hour. There are no restrictions for this procedure. Please do NOT wear cologne, perfume, aftershave, or lotions (deodorant is allowed). Please arrive 15 minutes prior to your appointment time.     Follow-Up: At [facility], you and your health needs are our priority.  As part of our continuing mission to provide you with exceptional heart care, we have created designated Provider Care Teams.  These Care Teams include your primary Cardiologist (physician) and Advanced Practice Providers (APPs -  Physician Assistants and Nurse Practitioners) who all work together to provide you with the care you need, when you need it.  We recommend signing up for the patient portal called "MyChart".  Sign up information is provided on this After Visit Summary.  MyChart is used to connect with patients for Virtual Visits (Telemedicine).  Patients are able to view lab/test results, encounter notes, upcoming appointments, etc.  Non-urgent messages can be sent to  your provider as well.   To learn more about what you can do with MyChart, go to [email or web address].    Your next appointment:   6 month(s)  The format for your next appointment:   In Person  Provider:   Vern Claude Madireddy, MD   Other Instructions Echocardiogram An echocardiogram is a test that uses sound waves (ultrasound) to produce images of the heart. Images from an echocardiogram can provide important information about: Heart size and shape. The size and thickness and movement of your heart's walls. Heart muscle function and strength. Heart valve function or if you have stenosis. Stenosis is when the heart valves are too narrow. If blood is flowing backward through the heart valves (regurgitation). A tumor or infectious growth around the heart valves. Areas of heart muscle that are not working well because of poor blood flow or injury from a heart attack. Aneurysm detection. An aneurysm is a weak or damaged part of an artery wall. The wall bulges out from the normal force of blood pumping through the body. Tell a health care provider about: Any allergies you have. All medicines you are taking, including vitamins, herbs, eye drops, creams, and over-the-counter medicines. Any blood disorders you have. Any surgeries you have had. Any medical conditions you have. Whether you are pregnant or may be pregnant. What are the risks? Generally, this is a safe test. However, problems may occur, including an allergic reaction to dye (contrast) that may be used during the test. What happens before the test? No specific preparation is needed. You may eat and drink normally. What happens during the test? You will  take off your clothes from the waist up and put on a hospital gown. Electrodes or electrocardiogram (ECG)patches may be placed on your chest. The electrodes or patches are then connected to a device that monitors your heart rate and rhythm. You will lie down on a table for  an ultrasound exam. A gel will be applied to your chest to help sound waves pass through your skin. A handheld device, called a transducer, will be pressed against your chest and moved over your heart. The transducer produces sound waves that travel to your heart and bounce back (or "echo" back) to the transducer. These sound waves will be captured in real-time and changed into images of your heart that can be viewed on a video monitor. The images will be recorded on a computer and reviewed by your health care provider. You may be asked to change positions or hold your breath for a short time. This makes it easier to get different views or better views of your heart. In some cases, you may receive contrast through an IV in one of your veins. This can improve the quality of the pictures from your heart. The procedure may vary among health care providers and hospitals.   What can I expect after the test? You may return to your normal, everyday life, including diet, activities, and medicines, unless your health care provider tells you not to do that. Follow these instructions at home: It is up to you to get the results of your test. Ask your health care provider, or the department that is doing the test, when your results will be ready. Keep all follow-up visits. This is important. Summary An echocardiogram is a test that uses sound waves (ultrasound) to produce images of the heart. Images from an echocardiogram can provide important information about the size and shape of your heart, heart muscle function, heart valve function, and other possible heart problems. You do not need to do anything to prepare before this test. You may eat and drink normally. After the echocardiogram is completed, you may return to your normal, everyday life, unless your health care provider tells you not to do that. This information is not intended to replace advice given to you by your health care provider. Make sure you  discuss any questions you have with your health care provider. Document Revised: 06/05/2020 Document Reviewed: 06/05/2020 Elsevier Patient Education  2021 Elsevier Inc[employer]   Important Information About Sugar

## 2023-09-07 NOTE — Assessment & Plan Note (Signed)
Palpitations seem to be associated with activity. Elevated heart rates. Event monitor from last year noted predominantly sinus tachycardia with activity associated with her symptoms.  Likely related to inappropriate sinus tachycardia [average heart rate was 90 bpm on the event monitor March 2023] versus postural orthostatic tachycardia syndrome versus dysautonomia in the setting of her longstanding history of diabetes mellitus,  Recommended to keep herself well-hydrated. Advised her to avoid stimulants. She will be unable to discontinue Vyvanse given her ADHD symptoms. Advised her to discontinue caffeine intake and wean herself off caffeinated sodas.  Advised her to keep herself well-hydrated drink plenty of fluids through the day.  Advised her to do exercises which she can do sitting or supine in order to help her cardiac conditioning.  Will obtain a transthoracic echocardiogram to exclude any significant underlying cardiac structural or functional issues.  Continue with metoprolol succinate 25 mg once daily.

## 2023-09-07 NOTE — Assessment & Plan Note (Signed)
Discussed harmful effects of smoking and strongly recommended to quit.

## 2023-09-10 ENCOUNTER — Other Ambulatory Visit: Payer: Self-pay

## 2023-09-10 DIAGNOSIS — I10 Essential (primary) hypertension: Secondary | ICD-10-CM

## 2023-09-10 DIAGNOSIS — R002 Palpitations: Secondary | ICD-10-CM

## 2023-09-10 DIAGNOSIS — Z720 Tobacco use: Secondary | ICD-10-CM

## 2023-10-02 ENCOUNTER — Ambulatory Visit: Payer: Medicaid Other | Admitting: Family Medicine

## 2023-10-05 ENCOUNTER — Ambulatory Visit (HOSPITAL_BASED_OUTPATIENT_CLINIC_OR_DEPARTMENT_OTHER)
Admission: RE | Admit: 2023-10-05 | Discharge: 2023-10-05 | Disposition: A | Discharge status: Home / Self Care | Payer: Medicaid Other | Source: Ambulatory Visit

## 2023-10-05 DIAGNOSIS — I10 Essential (primary) hypertension: Secondary | ICD-10-CM | POA: Diagnosis not present

## 2023-10-05 DIAGNOSIS — R002 Palpitations: Secondary | ICD-10-CM | POA: Diagnosis not present

## 2023-10-05 LAB — ECHOCARDIOGRAM COMPLETE
AR max vel: 1.97 cm2
AV Area VTI: 1.84 cm2
AV Area mean vel: 2.21 cm2
AV Mean grad: 4 mm[Hg]
AV Peak grad: 7.2 mm[Hg]
Ao pk vel: 1.34 m/s
Area-P 1/2: 4.06 cm2
Calc EF: 66.8 %
MV M vel: 1.98 m/s
MV Peak grad: 15.7 mm[Hg]
S' Lateral: 2.8 cm
Single Plane A2C EF: 65.3 %
Single Plane A4C EF: 68 %

## 2023-10-07 ENCOUNTER — Encounter: Payer: Self-pay | Admitting: Physician Assistant

## 2023-10-07 ENCOUNTER — Ambulatory Visit: Payer: Medicaid Other | Admitting: Physician Assistant

## 2023-10-07 VITALS — BP 114/63 | HR 85 | Resp 14 | Ht 64.0 in | Wt 211.0 lb

## 2023-10-07 DIAGNOSIS — R52 Pain, unspecified: Secondary | ICD-10-CM | POA: Diagnosis not present

## 2023-10-07 DIAGNOSIS — A09 Infectious gastroenteritis and colitis, unspecified: Secondary | ICD-10-CM

## 2023-10-07 DIAGNOSIS — R112 Nausea with vomiting, unspecified: Secondary | ICD-10-CM

## 2023-10-07 DIAGNOSIS — K529 Noninfective gastroenteritis and colitis, unspecified: Secondary | ICD-10-CM

## 2023-10-07 LAB — POC COVID19 BINAXNOW: SARS Coronavirus 2 Ag: NEGATIVE

## 2023-10-07 MED ORDER — ONDANSETRON 8 MG PO TBDP: 8.0000 mg | ORAL_TABLET | Freq: Three times a day (TID) | ORAL | 1 refills | Status: DC | PRN

## 2023-10-07 NOTE — Progress Notes (Signed)
Acute Office Visit  Subjective:     Patient ID: Rachael Fowler, female    DOB: 1985-11-02, 37 y.o.   MRN: [id number]  Chief Complaint  Patient presents with   Nausea   Emesis   Fatigue    HPI Patient is in today for N/V/D, fatigue, ear pain, joint pain/body aches, and LOA starting last Monday,12/2. Started with diarrhea on Monday which has been on/off ever since. Progressed to vomiting on Saturday. Had fever. She also reports cough on/off. Everyone in her family has similar symptoms. No one has done at home COVID/flu test. She has not taken anything to help.   Review of Systems  Constitutional:  Positive for fever and malaise/fatigue.  HENT:  Positive for ear pain. Negative for congestion and sore throat.   Respiratory:  Positive for cough. Negative for shortness of breath.   Gastrointestinal:  Positive for diarrhea, nausea and vomiting. Negative for abdominal pain and constipation.  Musculoskeletal:  Positive for joint pain and myalgias.      Objective:    BP 114/63 (BP Location: Right Arm, Patient Position: Sitting)   Pulse 85   Resp 14   Ht 5\' 4"  (1.626 m)   Wt 211 lb (95.7 kg)   SpO2 97%   BMI 36.22 kg/m    Physical Exam Constitutional:      Appearance: She is obese.  HENT:     Right Ear: Ear canal and external ear normal. A middle ear effusion (dull TM) is present. There is no impacted cerumen. Tympanic membrane is not erythematous, retracted or bulging.     Left Ear: Ear canal and external ear normal. A middle ear effusion (dull TM) is present. There is no impacted cerumen. Tympanic membrane is not erythematous, retracted or bulging.     Nose: No congestion or rhinorrhea.     Mouth/Throat:     Pharynx: No oropharyngeal exudate or posterior oropharyngeal erythema.  Cardiovascular:     Rate and Rhythm: Normal rate and regular rhythm.     Pulses: Normal pulses.     Heart sounds: Normal heart sounds.  Pulmonary:     Effort: Pulmonary effort is normal.     Breath  sounds: Normal breath sounds.  Lymphadenopathy:     Cervical: No cervical adenopathy.  Neurological:     Mental Status: She is alert.   .. Results for orders placed or performed in visit on 10/07/23  POC COVID-19   Collection Time: 10/07/23  3:44 PM  Result Value Ref Range   SARS Coronavirus 2 Ag Negative Negative  CMP14+EGFR   Collection Time: 10/07/23  3:48 PM  Result Value Ref Range   Glucose 111 (H) 70 - 99 mg/dL   BUN 12 6 - 20 mg/dL   Creatinine, Ser 9.56 0.57 - 1.00 mg/dL   eGFR 213 >08 MV/HQI/6.96   BUN/Creatinine Ratio 18 9 - 23   Sodium 141 134 - 144 mmol/L   Potassium 4.1 3.5 - 5.2 mmol/L   Chloride 102 96 - 106 mmol/L   CO2 28 20 - 29 mmol/L   Calcium 9.1 8.7 - 10.2 mg/dL   Total Protein 6.4 6.0 - 8.5 g/dL   Albumin 4.0 3.9 - 4.9 g/dL   Globulin, Total 2.4 1.5 - 4.5 g/dL   Bilirubin Total 0.2 0.0 - 1.2 mg/dL   Alkaline Phosphatase 71 44 - 121 IU/L   AST 45 (H) 0 - 40 IU/L   ALT 32 0 - 32 IU/L  CBC w/Diff/Platelet   Collection  Time: 10/07/23  3:48 PM  Result Value Ref Range   WBC 7.8 3.4 - 10.8 x10E3/uL   RBC 4.59 3.77 - 5.28 x10E6/uL   Hemoglobin 12.6 11.1 - 15.9 g/dL   Hematocrit 65.7 84.6 - 46.6 %   MCV 85 79 - 97 fL   MCH 27.5 26.6 - 33.0 pg   MCHC 32.3 31.5 - 35.7 g/dL   RDW 96.2 95.2 - 84.1 %   Platelets 459 (H) 150 - 450 x10E3/uL   Neutrophils 46 Not Estab. %   Lymphs 40 Not Estab. %   Monocytes 10 Not Estab. %   Eos 3 Not Estab. %   Basos 1 Not Estab. %   Neutrophils Absolute 3.6 1.4 - 7.0 x10E3/uL   Lymphocytes Absolute 3.1 0.7 - 3.1 x10E3/uL   Monocytes Absolute 0.8 0.1 - 0.9 x10E3/uL   EOS (ABSOLUTE) 0.3 0.0 - 0.4 x10E3/uL   Basophils Absolute 0.1 0.0 - 0.2 x10E3/uL   Immature Granulocytes 0 Not Estab. %   Immature Grans (Abs) 0.0 0.0 - 0.1 x10E3/uL        Assessment & Plan:  Marland KitchenMarland KitchenKayla "Lowella Bandy" was seen today for nausea, emesis and fatigue.  Diagnoses and all orders for this visit:  Gastroenteritis -     ondansetron (ZOFRAN-ODT) 8  MG disintegrating tablet; Take 1 tablet (8 mg total) by mouth every 8 (eight) hours as needed.  Body aches -     POC COVID-19 -     Cancel: POCT Influenza A/B -     CMP14+EGFR -     CBC w/Diff/Platelet  Diarrhea of infectious origin -     POC COVID-19 -     Cancel: POCT Influenza A/B -     CMP14+EGFR -     CBC w/Diff/Platelet  Nausea and vomiting, unspecified vomiting type -     ondansetron (ZOFRAN-ODT) 8 MG disintegrating tablet; Take 1 tablet (8 mg total) by mouth every 8 (eight) hours as needed.    COVID negative. Flu test invalid and out of window to treat. Viral gastroenteritis most likely etiology. Recommended bland diet and hydration. Imodium for diarrhea. Zofran for nausea. CBC and CMP ordered, to check on kidney function and WBC. HO on viral gastroenteritis provided.    Tandy Gaw, PA-C

## 2023-10-07 NOTE — Patient Instructions (Addendum)
Imodium to help with diarrhea Zofran to help with nausea  Viral Gastroenteritis, Adult  Viral gastroenteritis is also known as the stomach flu. This condition may affect your stomach, your small intestine, and your large intestine. It can cause sudden watery poop (diarrhea), fever, and vomiting. This condition is caused by certain germs (viruses). These germs can be passed from person to person very easily (are contagious). Having watery poop and vomiting can make you feel weak and cause you to not have enough water in your body (get dehydrated). This can make you tired and thirsty, make you have a dry mouth, and make it so you pee (urinate) less often. It is important to replace the fluids that you lose from having watery poop and vomiting. What are the causes? You can get sick by catching germs from other people. You can also get sick by: Eating food, drinking water, or touching a surface that has the germs on it (is contaminated). Sharing utensils or other personal items with a person who is sick. What increases the risk? Having a weak body defense system (immune system). Living with one or more children who are younger than 2 years. Living in a nursing home. Going on cruise ships. What are the signs or symptoms? Symptoms of this condition start suddenly. Symptoms may last for a few days or for as long as a week. Common symptoms include: Watery poop. Vomiting. Other symptoms include: Fever. Headache. Feeling tired (fatigue). Pain in the belly (abdomen). Chills. Feeling weak. Feeling like you may vomit (nauseous). Muscle aches. Not feeling hungry. How is this treated? This condition typically goes away on its own. The focus of treatment is to replace the fluids that you lose. This condition may be treated with: An ORS (oral rehydration solution). This is a drink that helps you replace fluids and minerals your body lost. It is sold at pharmacies and stores. Medicines to help with  your symptoms. Probiotic supplements to reduce symptoms of watery poop. Fluids given through an IV tube, if needed. Older adults and people with other diseases or a weak body defense system are at higher risk for not having enough water in the body. Follow these instructions at home: Eating and drinking  Take an ORS as told by your doctor. Drink clear fluids in small amounts as you are able. Clear fluids include: Water. Ice chips. Fruit juice that has water added to it (is diluted). Low-calorie sports drinks. Drink enough fluid to keep your pee (urine) pale yellow. Eat small amounts of healthy foods every 3-4 hours as you are able. This may include whole grains, fruits, vegetables, lean meats, and yogurt. Avoid fluids that have a lot of sugar or caffeine in them. This includes energy drinks, sports drinks, and soda. Avoid spicy or fatty foods. Avoid alcohol. General instructions  Wash your hands often. This is very important after you have watery poop or you vomit. If you cannot use soap and water, use hand sanitizer. Make sure that all people in your home wash their hands well and often. Take over-the-counter and prescription medicines only as told by your doctor. Rest at home while you get better. Watch your condition for any changes. Take a warm bath to help with any burning or pain from having watery poop. Keep all follow-up visits. Contact a doctor if: You cannot keep fluids down. Your symptoms get worse. You have new symptoms. You feel light-headed or dizzy. You have muscle cramps. Get help right away if: You have chest  pain. You have trouble breathing, or you are breathing very fast. You have a fast heartbeat. You feel very weak or you faint. You have a very bad headache, a stiff neck, or both. You have a rash. You have very bad pain, cramping, or bloating in your belly. Your skin feels cold and clammy. You feel mixed up (confused). You have pain when you pee. You  have signs of not having enough water in the body, such as: Dark pee, hardly any pee, or no pee. Cracked lips. Dry mouth. Sunken eyes. Feeling very sleepy. Feeling weak. You have signs of bleeding, such as: You see blood in your vomit. Your vomit looks like coffee grounds. You have bloody or black poop or poop that looks like tar. These symptoms may be an emergency. Get help right away. Call 911. Do not wait to see if the symptoms will go away. Do not drive yourself to the hospital. Summary Viral gastroenteritis is also known as the stomach flu. This condition can cause sudden watery poop (diarrhea), fever, and vomiting. These germs can be passed from person to person very easily. Take an ORS (oral rehydration solution) as told by your doctor. This is a drink that is sold at pharmacies and stores. Wash your hands often, especially after having watery poop or vomiting. If you cannot use soap and water, use hand sanitizer. This information is not intended to replace advice given to you by your health care provider. Make sure you discuss any questions you have with your health care provider. Document Revised: 08/12/2021 Document Reviewed: 08/12/2021 Elsevier Patient Education  2024 [employer].

## 2023-10-08 LAB — CMP14+EGFR
ALT: 32 [IU]/L (ref 0–32)
AST: 45 [IU]/L — ABNORMAL HIGH (ref 0–40)
Albumin: 4 g/dL (ref 3.9–4.9)
Alkaline Phosphatase: 71 [IU]/L (ref 44–121)
BUN/Creatinine Ratio: 18 (ref 9–23)
BUN: 12 mg/dL (ref 6–20)
Bilirubin Total: 0.2 mg/dL (ref 0.0–1.2)
CO2: 28 mmol/L (ref 20–29)
Calcium: 9.1 mg/dL (ref 8.7–10.2)
Chloride: 102 mmol/L (ref 96–106)
Creatinine, Ser: 0.65 mg/dL (ref 0.57–1.00)
Globulin, Total: 2.4 g/dL (ref 1.5–4.5)
Glucose: 111 mg/dL — ABNORMAL HIGH (ref 70–99)
Potassium: 4.1 mmol/L (ref 3.5–5.2)
Sodium: 141 mmol/L (ref 134–144)
Total Protein: 6.4 g/dL (ref 6.0–8.5)
eGFR: 118 mL/min/{1.73_m2} (ref >59)

## 2023-10-08 LAB — CBC WITH DIFFERENTIAL/PLATELET
Basophils Absolute: 0.1 10*3/uL (ref 0.0–0.2)
Basos: 1 %
EOS (ABSOLUTE): 0.3 10*3/uL (ref 0.0–0.4)
Eos: 3 %
Hematocrit: 39 % (ref 34.0–46.6)
Hemoglobin: 12.6 g/dL (ref 11.1–15.9)
Immature Grans (Abs): 0 10*3/uL (ref 0.0–0.1)
Immature Granulocytes: 0 %
Lymphocytes Absolute: 3.1 10*3/uL (ref 0.7–3.1)
Lymphs: 40 %
MCH: 27.5 pg (ref 26.6–33.0)
MCHC: 32.3 g/dL (ref 31.5–35.7)
MCV: 85 fL (ref 79–97)
Monocytes Absolute: 0.8 10*3/uL (ref 0.1–0.9)
Monocytes: 10 %
Neutrophils Absolute: 3.6 10*3/uL (ref 1.4–7.0)
Neutrophils: 46 %
Platelets: 459 10*3/uL — ABNORMAL HIGH (ref 150–450)
RBC: 4.59 x10E6/uL (ref 3.77–5.28)
RDW: 13.4 % (ref 11.7–15.4)
WBC: 7.8 10*3/uL (ref 3.4–10.8)

## 2023-10-09 ENCOUNTER — Encounter: Payer: Self-pay | Admitting: Physician Assistant

## 2023-10-09 NOTE — Progress Notes (Signed)
Normal WBC.  Kidney function good.  One liver enzyme up just a little but overall looks good!

## 2023-10-13 ENCOUNTER — Other Ambulatory Visit: Payer: Self-pay | Admitting: Family Medicine

## 2023-11-05 ENCOUNTER — Encounter: Payer: Self-pay | Admitting: Family Medicine

## 2023-11-05 ENCOUNTER — Ambulatory Visit (INDEPENDENT_AMBULATORY_CARE_PROVIDER_SITE_OTHER): Payer: Medicaid Other | Admitting: Family Medicine

## 2023-11-05 VITALS — BP 131/88 | HR 109 | Temp 98.5°F | Ht 64.0 in | Wt 209.0 lb

## 2023-11-05 DIAGNOSIS — M545 Low back pain, unspecified: Secondary | ICD-10-CM | POA: Diagnosis not present

## 2023-11-05 DIAGNOSIS — I10 Essential (primary) hypertension: Secondary | ICD-10-CM | POA: Diagnosis not present

## 2023-11-05 DIAGNOSIS — G629 Polyneuropathy, unspecified: Secondary | ICD-10-CM | POA: Diagnosis not present

## 2023-11-05 DIAGNOSIS — E119 Type 2 diabetes mellitus without complications: Secondary | ICD-10-CM | POA: Diagnosis not present

## 2023-11-05 DIAGNOSIS — Z20828 Contact with and (suspected) exposure to other viral communicable diseases: Secondary | ICD-10-CM

## 2023-11-05 DIAGNOSIS — G8929 Other chronic pain: Secondary | ICD-10-CM

## 2023-11-05 DIAGNOSIS — F410 Panic disorder [episodic paroxysmal anxiety] without agoraphobia: Secondary | ICD-10-CM | POA: Insufficient documentation

## 2023-11-05 DIAGNOSIS — R051 Acute cough: Secondary | ICD-10-CM

## 2023-11-05 MED ORDER — LISINOPRIL 10 MG PO TABS: 10.0000 mg | ORAL_TABLET | Freq: Every day | ORAL | 1 refills | Status: DC

## 2023-11-05 MED ORDER — ALPRAZOLAM 0.25 MG PO TABS: 0.2500 mg | ORAL_TABLET | Freq: Every day | ORAL | 0 refills | Status: DC | PRN

## 2023-11-05 MED ORDER — OSELTAMIVIR PHOSPHATE 75 MG PO CAPS: 75.0000 mg | ORAL_CAPSULE | Freq: Two times a day (BID) | ORAL | 0 refills | Status: DC

## 2023-11-05 MED ORDER — PREGABALIN 100 MG PO CAPS: 100.0000 mg | ORAL_CAPSULE | Freq: Two times a day (BID) | ORAL | 3 refills | Status: DC

## 2023-11-05 MED ORDER — FLUOXETINE HCL 20 MG PO CAPS: 20.0000 mg | ORAL_CAPSULE | Freq: Every day | ORAL | 0 refills | Status: DC

## 2023-11-05 NOTE — Assessment & Plan Note (Signed)
 Refilled her Lyrica for today.
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
## 2023-11-05 NOTE — Assessment & Plan Note (Signed)
 I am happy to see her back for her DM if can't get in with endocrine.

## 2023-11-05 NOTE — Assessment & Plan Note (Signed)
 Refilled lisinopril. Has metoprolol as well.

## 2023-11-05 NOTE — Assessment & Plan Note (Signed)
 She is gena work on trying to get in with a therapist that takes Medicaid in the short-term I did give her a short prescription of alprazolam to use sparingly.

## 2023-11-26 ENCOUNTER — Other Ambulatory Visit: Payer: Self-pay | Admitting: Family Medicine

## 2023-11-26 DIAGNOSIS — F902 Attention-deficit hyperactivity disorder, combined type: Secondary | ICD-10-CM

## 2023-11-27 MED ORDER — LISDEXAMFETAMINE DIMESYLATE 50 MG PO CAPS: 50.0000 mg | ORAL_CAPSULE | Freq: Every day | ORAL | 0 refills | Status: DC

## 2024-01-02 ENCOUNTER — Other Ambulatory Visit: Payer: Self-pay | Admitting: Family Medicine

## 2024-02-01 ENCOUNTER — Other Ambulatory Visit: Payer: Self-pay | Admitting: Family Medicine

## 2024-02-21 ENCOUNTER — Ambulatory Visit
Admission: EM | Admit: 2024-02-21 | Discharge: 2024-02-21 | Disposition: A | Discharge status: Home / Self Care | Attending: Family Medicine | Admitting: Family Medicine

## 2024-02-21 DIAGNOSIS — J0141 Acute recurrent pansinusitis: Secondary | ICD-10-CM

## 2024-02-21 DIAGNOSIS — H6501 Acute serous otitis media, right ear: Secondary | ICD-10-CM | POA: Diagnosis not present

## 2024-02-21 MED ORDER — PREDNISONE 20 MG PO TABS: 40.0000 mg | ORAL_TABLET | Freq: Every day | ORAL | 0 refills | Status: DC

## 2024-02-21 MED ORDER — DOXYCYCLINE HYCLATE 100 MG PO CAPS: 100.0000 mg | ORAL_CAPSULE | Freq: Two times a day (BID) | ORAL | 0 refills | Status: DC

## 2024-02-21 NOTE — ED Triage Notes (Signed)
 [facility] with [facility] sore throat, drainage, congestion, sob, and chest burning sensation since yesterday. Pt reports she has taken otc cold medications yesterday. Son sick with ear infection and sinus infection

## 2024-02-21 NOTE — ED Provider Notes (Signed)
 [facility]    CSN: [id number] Arrival date & time: 02/21/24  1005      History   Chief Complaint Chief Complaint  Patient presents with   URI    HPI Rachael Fowler is a 38 y.o. female.   Patient tells me she has been sick for 6 days.  Worsened since yesterday.  She has developed cough and cold, sinus congestion postnasal drip, ear pressure and pain, fatigue, no fever or chills.  Positive headache. This is a chronically ill woman who has recurring infections.  States her immunity is poor.  She has mental illness including anxiety and depression with attention deficit disorder.  She has asthma and allergies.  She has diabetes hypertension hyperlipidemia PCOS and tobacco dependence    Past Medical History:  Diagnosis Date   Abnormal WBC count 05/26/2018   Anxiety and depression 05/26/2018   Asthma    Attention deficit hyperactivity disorder (ADHD), combined type 10/17/2021   Chronic bilateral low back pain without sciatica 05/20/2022   Chronic fatigue, unspecified 12/29/2022   Chronic hypertension 03/12/2021   Depression and anxiety    Diabetes mellitus (HCC) 12/29/2022   Diagnosis right around time of +UPT     Diabetes mellitus without complication (HCC)    Hirsutism 05/26/2018   Hypertension    Itching 12/18/2021   Lumbar spondylosis 08/17/2018   Migraines 05/26/2018   Moderate persistent asthma with exacerbation 02/05/2018   No evidence of joint hypermobility syndrome 01/15/2022   Genetic testing was negative through [facility]     Obesity 12/29/2022   Palpitations 01/14/2022   Patellofemoral syndrome, bilateral 08/17/2018   PCOS (polycystic ovarian syndrome)    Psychophysiological insomnia 05/26/2018   Rash and other nonspecific skin eruption 12/29/2022   S/P primary low transverse C-section 05/18/2018   Small fiber neuropathy 10/17/2021   Diagnosed by neurology in 2022.     Tobacco use 05/18/2018   Type 2 diabetes mellitus without complication, without  long-term current use of insulin (HCC) 08/19/2017    Patient Active Problem List   Diagnosis Date Noted   Panic attack 11/05/2023   Asthma    Depression and anxiety    Diabetes mellitus without complication (HCC)    Hypertension    PCOS (polycystic ovarian syndrome)    Chronic fatigue, unspecified 12/29/2022   Diabetes mellitus (HCC) 12/29/2022   Obesity 12/29/2022   Chronic bilateral low back pain without sciatica 05/20/2022   No evidence of joint hypermobility syndrome 01/15/2022   Palpitations 01/14/2022   Itching 12/18/2021   Attention deficit hyperactivity disorder (ADHD), combined type 10/17/2021   Small fiber neuropathy 10/17/2021   Patellofemoral syndrome, bilateral 08/17/2018   Lumbar spondylosis 08/17/2018   Hirsutism 05/26/2018   Abnormal WBC count 05/26/2018   Psychophysiological insomnia 05/26/2018   Anxiety and depression 05/26/2018   Migraines 05/26/2018   S/P primary low transverse C-section 05/18/2018   Tobacco use 05/18/2018   Moderate persistent asthma with exacerbation 02/05/2018   Type 2 diabetes mellitus without complication, without long-term current use of insulin (HCC) 08/19/2017   OTHER ACUTE REACTIONS TO STRESS 12/04/2009    Past Surgical History:  Procedure Laterality Date   CESAREAN SECTION     KNEE SURGERY  04/20/2006   x2    OB History     Gravida  1   Para  1   Term  1   Preterm      AB      Living  1      SAB  IAB      Ectopic      Multiple      Live Births  1            Home Medications    Prior to Admission medications   Medication Sig Start Date End Date Taking? Authorizing Provider  ACCU-CHEK GUIDE test strip Use as directed 01/15/22   Cydney Draft, MD  albuterol  (VENTOLIN  HFA) 108 (90 Base) MCG/ACT inhaler Inhale 2 puffs into the lungs every 6 (six) hours as needed for wheezing or shortness of breath. 10/10/20   Adela Holter, DO  ALPRAZolam  (XANAX ) 0.25 MG tablet Take 1 tablet (0.25 mg  total) by mouth daily as needed for anxiety. Panic attacks only. 11/05/23   Cydney Draft, MD  Blood Glucose Monitoring Suppl (ACCU-CHEK AVIVA PLUS) w/Device KIT Accu-Chek Aviva Plus Meter  USE AS DIRECTED    [provider]  cetirizine (ZYRTEC) 10 MG tablet Take 10 mg by mouth 2 (two) times daily. 04/21/22   [provider]  doxycycline  (VIBRAMYCIN ) 100 MG capsule Take 1 capsule (100 mg total) by mouth 2 (two) times daily. 02/21/24  Yes Stephany Ehrich, MD  EPIPEN  2-PAK 0.3 MG/0.3ML SOAJ injection Inject 0.3 mg into the muscle as needed for anaphylaxis. 12/18/21   [provider]  FLUoxetine  (PROZAC ) 20 MG capsule TAKE 1 CAPSULE BY MOUTH EVERY DAY 02/01/24   Cydney Draft, MD  lisdexamfetamine (VYVANSE ) 50 MG capsule Take 1 capsule (50 mg total) by mouth daily. 07/02/23   Cydney Draft, MD  lisdexamfetamine (VYVANSE ) 50 MG capsule Take 1 capsule (50 mg total) by mouth daily. 07/31/23   Cydney Draft, MD  lisdexamfetamine (VYVANSE ) 50 MG capsule Take 1 capsule (50 mg total) by mouth daily. 11/27/23   Cydney Draft, MD  lisinopril  (ZESTRIL ) 10 MG tablet Take 1 tablet (10 mg total) by mouth daily. 11/05/23   Cydney Draft, MD  meloxicam  (MOBIC ) 15 MG tablet TAKE 1 TABLET BY MOUTH EVERY DAY AS NEEDED 07/01/23   Cydney Draft, MD  metFORMIN  (GLUCOPHAGE -XR) 500 MG 24 hr tablet Take 1,000 mg by mouth 2 (two) times daily with a meal. 05/29/23   [provider]  metoprolol  succinate (TOPROL -XL) 25 MG 24 hr tablet TAKE 1 TABLET (25 MG TOTAL) BY MOUTH DAILY. 10/16/23   Cydney Draft, MD  omeprazole  (PRILOSEC) 10 MG capsule TAKE 1 CAPSULE BY MOUTH EVERY DAY 07/01/23   Cydney Draft, MD  ondansetron  (ZOFRAN -ODT) 8 MG disintegrating tablet Take 1 tablet (8 mg total) by mouth every 8 (eight) hours as needed. 10/07/23   Breeback, Jade L, PA-C  oseltamivir  (TAMIFLU ) 75 MG capsule Take 1 capsule (75 mg total) by mouth 2 (two)  times daily. 11/05/23   Cydney Draft, MD  predniSONE  (DELTASONE ) 20 MG tablet Take 2 tablets (40 mg total) by mouth daily with breakfast. 02/21/24  Yes Stephany Ehrich, MD  pregabalin  (LYRICA ) 100 MG capsule Take 1 capsule (100 mg total) by mouth 2 (two) times daily. 11/05/23   Cydney Draft, MD  QUEtiapine  (SEROQUEL ) 50 MG tablet TAKE 1 TABLET BY MOUTH EVERYDAY AT BEDTIME 01/06/24   Cydney Draft, MD  simvastatin (ZOCOR) 10 MG tablet Take 10 mg by mouth daily at 6 PM. 03/16/23   [provider]  TRULICITY  3 MG/0.5ML SOPN Inject 3 mg into the skin once a week.    [provider]    Family History Family History  Problem Relation  Age of Onset   Depression Mother    Diabetes Mother    Hyperlipidemia Mother    Hypertension Mother    Hyperlipidemia Father    Hypertension Father    Cancer Maternal Grandmother    Hypertension Maternal Grandmother    Hyperlipidemia Maternal Grandmother    Hyperlipidemia Paternal Grandmother    Cancer Maternal Uncle    Diabetes Maternal Uncle    Colon cancer Neg Hx    Breast cancer Neg Hx     Social History Social History   Tobacco Use   Smoking status: Some Days    Current packs/day: 0.00    Average packs/day: 0.5 packs/day for 15.2 years (7.6 ttl pk-yrs)    Types: Cigarettes, Cigars    Start date: 10/27/2004    Last attempt to quit: 01/22/2020    Years since quitting: 4.0   Smokeless tobacco: Never  Vaping Use   Vaping status: Never Used  Substance Use Topics   Alcohol use: Not Currently    Comment: rarely   Drug use: Never     Allergies   Gabapentin , Hydrocodone , Amphetamine -dextroamphetamine , Penicillins, Aripiprazole , Montelukast , and Zolpidem   Review of Systems Review of Systems See HPI  Physical Exam Triage Vital Signs ED Triage Vitals  Encounter Vitals Group     BP 02/21/24 1030 118/77     Systolic BP Percentile --      Diastolic BP Percentile --      Pulse Rate 02/21/24 1030 90      Resp 02/21/24 1030 16     Temp 02/21/24 1030 98.4 F (36.9 C)     Temp src --      SpO2 02/21/24 1030 96 %     Weight --      Height --      Head Circumference --      Peak Flow --      Pain Score 02/21/24 1029 5     Pain Loc --      Pain Education --      Exclude from Growth Chart --    No data found.  Updated Vital Signs BP 118/77   Pulse 90   Temp 98.4 F (36.9 C)   Resp 16   LMP 01/19/2024   SpO2 96%       Physical Exam Constitutional:      General: She is not in acute distress.    Appearance: She is well-developed. She is obese. She is ill-appearing.  HENT:     Head: Normocephalic and atraumatic.     Left Ear: Tympanic membrane normal.     Ears:     Comments: Right TM is injected    Nose: Congestion present. No rhinorrhea.     Mouth/Throat:     Pharynx: No posterior oropharyngeal erythema.  Eyes:     Conjunctiva/sclera: Conjunctivae normal.     Pupils: Pupils are equal, round, and reactive to light.  Cardiovascular:     Rate and Rhythm: Normal rate.  Pulmonary:     Effort: Pulmonary effort is normal. No respiratory distress.     Breath sounds: Rhonchi present.  Abdominal:     General: There is no distension.     Palpations: Abdomen is soft.  Musculoskeletal:        General: Normal range of motion.     Cervical back: Normal range of motion.  Lymphadenopathy:     Cervical: No cervical adenopathy.  Skin:    General: Skin is warm and dry.  Neurological:  Mental Status: She is alert.      UC Treatments / Results  Labs (all labs ordered are listed, but only abnormal results are displayed) Labs Reviewed - No data to display  EKG   Radiology No results found.  Procedures Procedures (including critical care time)  Medications Ordered in UC Medications - No data to display  Initial Impression / Assessment and Plan / UC Course  I have reviewed the triage vital signs and the nursing notes.  Pertinent labs & imaging results that were  available during my care of the patient were reviewed by me and considered in my medical decision making (see chart for details).     Final Clinical Impressions(s) / UC Diagnoses   Final diagnoses:  Acute recurrent pansinusitis  Right acute serous otitis media, recurrence not specified     Discharge Instructions      May take over-the-counter cough and cold medicines Drink lots of water Take the doxycycline  2 times a day.  Be sure to take this medicine with food Take the prednisone  daily.  Watch your carbohydrates while on prednisone  See your doctor if not improving by next week   ED Prescriptions     Medication Sig Dispense Auth. Provider   doxycycline  (VIBRAMYCIN ) 100 MG capsule Take 1 capsule (100 mg total) by mouth 2 (two) times daily. 14 capsule Stephany Ehrich, MD   predniSONE  (DELTASONE ) 20 MG tablet Take 2 tablets (40 mg total) by mouth daily with breakfast. 10 tablet Stephany Ehrich, MD      PDMP not reviewed this encounter.   Stephany Ehrich, MD 02/21/24 1314

## 2024-02-21 NOTE — Discharge Instructions (Signed)
 May take over-the-counter cough and cold medicines Drink lots of water Take the doxycycline  2 times a day.  Be sure to take this medicine with food Take the prednisone  daily.  Watch your carbohydrates while on prednisone  See your doctor if not improving by next week

## 2024-02-24 ENCOUNTER — Encounter: Payer: Self-pay | Admitting: Physician Assistant

## 2024-02-25 ENCOUNTER — Other Ambulatory Visit: Payer: Self-pay | Admitting: Physician Assistant

## 2024-02-26 ENCOUNTER — Ambulatory Visit (INDEPENDENT_AMBULATORY_CARE_PROVIDER_SITE_OTHER): Admitting: Family Medicine

## 2024-02-26 ENCOUNTER — Encounter: Payer: Self-pay | Admitting: Family Medicine

## 2024-02-26 VITALS — BP 115/88 | HR 95 | Temp 98.3°F | Ht 64.0 in | Wt 211.0 lb

## 2024-02-26 DIAGNOSIS — J45901 Unspecified asthma with (acute) exacerbation: Secondary | ICD-10-CM

## 2024-02-26 DIAGNOSIS — J4520 Mild intermittent asthma, uncomplicated: Secondary | ICD-10-CM | POA: Diagnosis not present

## 2024-02-26 MED ORDER — METHYLPREDNISOLONE ACETATE 40 MG/ML IJ SUSP
40.0000 mg | Freq: Once | INTRAMUSCULAR | Status: AC
Start: 2024-02-26 — End: 2024-02-26
  Administered 2024-02-26: 40 mg via INTRAMUSCULAR

## 2024-02-26 MED ORDER — CEFDINIR 300 MG PO CAPS: 300.0000 mg | ORAL_CAPSULE | Freq: Two times a day (BID) | ORAL | 0 refills | Status: DC

## 2024-02-26 MED ORDER — ALBUTEROL SULFATE HFA 108 (90 BASE) MCG/ACT IN AERS: 2.0000 | INHALATION_SPRAY | Freq: Four times a day (QID) | RESPIRATORY_TRACT | 1 refills | Status: AC | PRN

## 2024-02-26 NOTE — Progress Notes (Signed)
 Acute Office Visit  Subjective:     Patient ID: Rachael Fowler, female    DOB: 02-Dec-1985, 38 y.o.   MRN: [id number]  Chief Complaint  Patient presents with   Cough    Chest congestion onset 1wk ,cough not better after antibiotic and steroids     HPI Patient is in today for Cough and chest congestion x 1 week. She was given doxy and prednisone  at [facility] on 4/27.  She felt really short of breath that day.  She is pretty sure that she got it from her son who is already been sick for a couple of weeks. Feels cough is getting worse.  The cough is quite forceful to the point that she is almost gagging.  She has been using Delsym and Zicam she did finish her prednisone  and is still on the doxycycline  still has a couple days left.  She has asthma history but has been out of albuterol  though she did use her son's nebulizer yesterday  ROS      Objective:    BP 115/88   Pulse 95   Temp 98.3 F (36.8 C) (Oral)   Ht 5\' 4"  (1.626 m)   Wt 211 lb (95.7 kg)   LMP 01/19/2024   SpO2 97%   BMI 36.22 kg/m    Physical Exam Vitals and nursing note reviewed.  Constitutional:      Appearance: Normal appearance.  HENT:     Head: Normocephalic and atraumatic.     Right Ear: Tympanic membrane, ear canal and external ear normal. There is no impacted cerumen.     Left Ear: Tympanic membrane, ear canal and external ear normal. There is no impacted cerumen.     Nose: Nose normal.     Mouth/Throat:     Pharynx: Oropharynx is clear.  Eyes:     Conjunctiva/sclera: Conjunctivae normal.  Cardiovascular:     Rate and Rhythm: Normal rate and regular rhythm.  Pulmonary:     Effort: Pulmonary effort is normal.     Breath sounds: Normal breath sounds.  Musculoskeletal:     Cervical back: Neck supple. No tenderness.  Lymphadenopathy:     Cervical: No cervical adenopathy.  Skin:    General: Skin is warm and dry.  Neurological:     Mental Status: She is alert and oriented to person, place, and time.   Psychiatric:        Mood and Affect: Mood normal.     No results found for any visits on 02/26/24.      Assessment & Plan:   Problem List Items Addressed This Visit       Respiratory   Asthma   Relevant Medications   albuterol  (VENTOLIN  HFA) 108 (90 Base) MCG/ACT inhaler   Other Relevant Orders   Bordetella pertussis PCR   Other Visit Diagnoses       Exacerbation of asthma, unspecified asthma severity, unspecified whether persistent    -  Primary   Relevant Medications   albuterol  (VENTOLIN  HFA) 108 (90 Base) MCG/ACT inhaler   methylPREDNISolone  acetate (DEPO-MEDROL ) injection 40 mg (Completed)   Other Relevant Orders   Bordetella pertussis PCR       Asthma exacerbation with lower respiratory tract infection-I am going to switch her off the doxycycline  to Omnicef she only has about a day left anyway.  We can give her Depo-Medrol  40 mg IM here.  Also send over albuterol  inhaler for her to use 2 to 4 puffs every 6 hours as  needed to try to really get her chest open while she is sick if she is not improving after the weekend then please let me know.  Will go ahead and test for pertussis since it sounds like this cough has been going around her household for about 3 weeks and she has significant coughing to the point of gagging.  Meds ordered this encounter  Medications   albuterol  (VENTOLIN  HFA) 108 (90 Base) MCG/ACT inhaler    Sig: Inhale 2 puffs into the lungs every 6 (six) hours as needed for wheezing or shortness of breath.    Dispense:  59.5 g    Refill:  1   methylPREDNISolone  acetate (DEPO-MEDROL ) injection 40 mg   cefdinir (OMNICEF) 300 MG capsule    Sig: Take 1 capsule (300 mg total) by mouth 2 (two) times daily.    Dispense:  14 capsule    Refill:  0    No follow-ups on file.  Duaine German, MD

## 2024-02-26 NOTE — Patient Instructions (Addendum)
 Use your albuterol , 2 to 4 puffs every 6 hours as needed.  For the next couple of days I would probably go ahead and do it 3 times a day scheduled and then if you start feeling better decrease down to twice a day and then use in between after that as needed.  Your throat moisturized with lozenges and sips of water.  Try to keep your head upright at night to help with postnasal drip and drainage.

## 2024-03-01 ENCOUNTER — Encounter: Payer: Self-pay | Admitting: Family Medicine

## 2024-03-01 LAB — BORDETELLA PERTUSSIS PCR
B. parapertussis DNA: NEGATIVE
B. pertussis DNA: NEGATIVE

## 2024-03-01 NOTE — Progress Notes (Signed)
 Hi Rachael Fowler, you are negative for pertussis which is reassuring hopefully you are starting to feel better.

## 2024-03-08 ENCOUNTER — Encounter: Payer: Self-pay | Admitting: Family Medicine

## 2024-03-09 MED ORDER — LISDEXAMFETAMINE DIMESYLATE 30 MG PO CAPS: 30.0000 mg | ORAL_CAPSULE | Freq: Every day | ORAL | 0 refills | Status: DC

## 2024-03-20 ENCOUNTER — Other Ambulatory Visit: Payer: Self-pay | Admitting: Family Medicine

## 2024-04-03 ENCOUNTER — Other Ambulatory Visit: Payer: Self-pay | Admitting: Family Medicine

## 2024-04-06 NOTE — Telephone Encounter (Signed)
 Called pt to scheduled, LVM.

## 2024-04-06 NOTE — Telephone Encounter (Signed)
 Pls contact the pt to schedule Seroquel  medication refill. Sending 30 day med refill until appt. Thx.

## 2024-04-08 ENCOUNTER — Ambulatory Visit (INDEPENDENT_AMBULATORY_CARE_PROVIDER_SITE_OTHER): Admitting: Physician Assistant

## 2024-04-08 ENCOUNTER — Encounter: Payer: Self-pay | Admitting: Physician Assistant

## 2024-04-08 VITALS — BP 129/86 | HR 81 | Ht 64.0 in | Wt 207.0 lb

## 2024-04-08 DIAGNOSIS — F32A Depression, unspecified: Secondary | ICD-10-CM

## 2024-04-08 DIAGNOSIS — F410 Panic disorder [episodic paroxysmal anxiety] without agoraphobia: Secondary | ICD-10-CM

## 2024-04-08 DIAGNOSIS — F419 Anxiety disorder, unspecified: Secondary | ICD-10-CM

## 2024-04-08 DIAGNOSIS — F4310 Post-traumatic stress disorder, unspecified: Secondary | ICD-10-CM | POA: Insufficient documentation

## 2024-04-08 DIAGNOSIS — I10 Essential (primary) hypertension: Secondary | ICD-10-CM | POA: Diagnosis not present

## 2024-04-08 DIAGNOSIS — F515 Nightmare disorder: Secondary | ICD-10-CM | POA: Insufficient documentation

## 2024-04-08 MED ORDER — PAROXETINE HCL 10 MG PO TABS: ORAL_TABLET | ORAL | 0 refills | Status: DC

## 2024-04-08 MED ORDER — ALPRAZOLAM 0.25 MG PO TABS
0.2500 mg | ORAL_TABLET | Freq: Every day | ORAL | 0 refills | Status: AC | PRN
Start: 2024-04-08 — End: ?

## 2024-04-08 MED ORDER — FLUOXETINE HCL 10 MG PO TABS: ORAL_TABLET | ORAL | 0 refills | Status: DC

## 2024-04-08 MED ORDER — LISINOPRIL 10 MG PO TABS: 10.0000 mg | ORAL_TABLET | Freq: Every day | ORAL | 1 refills | Status: AC
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
## 2024-04-08 NOTE — Patient Instructions (Addendum)
 Taper off prozac  and titrate up on paxil.  Stay on seroquel .

## 2024-04-13 ENCOUNTER — Other Ambulatory Visit (HOSPITAL_COMMUNITY): Payer: Self-pay

## 2024-04-13 ENCOUNTER — Telehealth: Payer: Self-pay

## 2024-04-13 NOTE — Telephone Encounter (Signed)
 Pharmacy Patient Advocate Encounter   Received notification from CoverMyMeds that prior authorization for FLUoxetine  HCl 10MG  tablets is required/requested.   Insurance verification completed.   The patient is insured through [facility] [place] .   Per test claim: The current 14 day co-pay is, $4.  No PA needed at this time. This test claim was processed through [facility]- copay amounts may vary at other pharmacies due to pharmacy/plan contracts, or as the patient moves through the different stages of their insurance plan.     Called pharmacy, they received a paid claim for 11 tablets for 14 day supply.

## 2024-04-15 ENCOUNTER — Encounter: Payer: Self-pay | Admitting: Family Medicine

## 2024-04-15 MED ORDER — FLUCONAZOLE 150 MG PO TABS
150.0000 mg | ORAL_TABLET | Freq: Once | ORAL | 1 refills | Status: AC
Start: 2024-04-15 — End: 2024-04-15

## 2024-04-20 ENCOUNTER — Telehealth: Payer: Self-pay | Admitting: Family Medicine

## 2024-04-20 NOTE — Telephone Encounter (Signed)
 Please call patient.  We did get notification that she has been filling her albuterol  frequently.  Typically if she is using her albuterol  more than twice a week we really need to have her on a controller like Symbicort or Advair.  Which she like me to send in an updated inhaler for her to use regularly so she is not having to grab her rescue inhaler as much?

## 2024-04-20 NOTE — Telephone Encounter (Signed)
 Left message for a return call

## 2024-04-20 NOTE — Telephone Encounter (Signed)
 Called and LVM advising pt of recommendations and advised her to either call or send my chart if ok with sending in a controller for her Asthma

## 2024-05-04 LAB — HEMOGLOBIN A1C: Hemoglobin A1C: 6.6

## 2024-05-05 ENCOUNTER — Other Ambulatory Visit: Payer: Self-pay | Admitting: Family Medicine

## 2024-05-15 ENCOUNTER — Encounter: Payer: Self-pay | Admitting: Family Medicine

## 2024-05-15 DIAGNOSIS — J4540 Moderate persistent asthma, uncomplicated: Secondary | ICD-10-CM

## 2024-05-16 ENCOUNTER — Ambulatory Visit

## 2024-05-16 ENCOUNTER — Ambulatory Visit
Admission: EM | Admit: 2024-05-16 | Discharge: 2024-05-16 | Disposition: A | Discharge status: Home / Self Care | Attending: Family Medicine | Admitting: Family Medicine

## 2024-05-16 ENCOUNTER — Encounter: Payer: Self-pay | Admitting: Emergency Medicine

## 2024-05-16 DIAGNOSIS — S46911A Strain of unspecified muscle, fascia and tendon at shoulder and upper arm level, right arm, initial encounter: Secondary | ICD-10-CM

## 2024-05-16 DIAGNOSIS — M25511 Pain in right shoulder: Secondary | ICD-10-CM | POA: Diagnosis not present

## 2024-05-16 MED ORDER — PREDNISONE 10 MG (21) PO TBPK: ORAL_TABLET | Freq: Every day | ORAL | 0 refills | Status: DC

## 2024-05-16 NOTE — ED Provider Notes (Signed)
 [name]    CSN: [id number] Arrival date & time: 05/16/24  9077      History   Chief Complaint Chief Complaint  Patient presents with   Shoulder Pain    HPI [name] is a 38 y.o. female.   HPI 38 year old female presents with right shoulder pain for 2 days secondary to mowing her yard.  PMH significant for morbid obesity, chronic fatigue, and HTN.  Past Medical History:  Diagnosis Date   Abnormal WBC count 05/26/2018   Anxiety and depression 05/26/2018   Asthma    Attention deficit hyperactivity disorder (ADHD), combined type 10/17/2021   Chronic bilateral low back pain without sciatica 05/20/2022   Chronic fatigue, unspecified 12/29/2022   Chronic hypertension 03/12/2021   Depression and anxiety    Diabetes mellitus (HCC) 12/29/2022   Diagnosis right around time of +UPT     Diabetes mellitus without complication (HCC)    Hirsutism 05/26/2018   Hypertension    Itching 12/18/2021   Lumbar spondylosis 08/17/2018   Migraines 05/26/2018   Moderate persistent asthma with exacerbation 02/05/2018   No evidence of joint hypermobility syndrome 01/15/2022   Genetic testing was negative through [facility]     Obesity 12/29/2022   Palpitations 01/14/2022   Patellofemoral syndrome, bilateral 08/17/2018   PCOS (polycystic ovarian syndrome)    Psychophysiological insomnia 05/26/2018   Rash and other nonspecific skin eruption 12/29/2022   S/P primary low transverse C-section 05/18/2018   Small fiber neuropathy 10/17/2021   Diagnosed by neurology in 2022.     Tobacco use 05/18/2018   Type 2 diabetes mellitus without complication, without long-term current use of insulin (HCC) 08/19/2017    Patient Active Problem List   Diagnosis Date Noted   PTSD (post-traumatic stress disorder) 04/08/2024   Nightmares 04/08/2024   Panic attack 11/05/2023   Asthma    Depression and anxiety    Diabetes mellitus without complication (HCC)    Hypertension    PCOS (polycystic  ovarian syndrome)    Chronic fatigue, unspecified 12/29/2022   Diabetes mellitus (HCC) 12/29/2022   Obesity 12/29/2022   Chronic bilateral low back pain without sciatica 05/20/2022   No evidence of joint hypermobility syndrome 01/15/2022   Palpitations 01/14/2022   Itching 12/18/2021   Attention deficit hyperactivity disorder (ADHD), combined type 10/17/2021   Small fiber neuropathy 10/17/2021   Patellofemoral syndrome, bilateral 08/17/2018   Lumbar spondylosis 08/17/2018   Hirsutism 05/26/2018   Abnormal WBC count 05/26/2018   Psychophysiological insomnia 05/26/2018   Anxiety and depression 05/26/2018   Migraines 05/26/2018   S/P primary low transverse C-section 05/18/2018   Tobacco use 05/18/2018   Moderate persistent asthma with exacerbation 02/05/2018   Type 2 diabetes mellitus without complication, without long-term current use of insulin (HCC) 08/19/2017   OTHER ACUTE REACTIONS TO STRESS 12/04/2009    Past Surgical History:  Procedure Laterality Date   CESAREAN SECTION     KNEE SURGERY  04/20/2006   x2    OB History     Gravida  1   Para  1   Term  1   Preterm      AB      Living  1      SAB      IAB      Ectopic      Multiple      Live Births  1            Home Medications    Prior to Admission  medications   Medication Sig Start Date End Date Taking? Authorizing Provider  ACCU-CHEK GUIDE test strip Use as directed 01/15/22  Yes Alvan Dorothyann BIRCH, MD  albuterol  (VENTOLIN  HFA) 108 [id number] Base) MCG/ACT inhaler Inhale 2 puffs into the lungs every 6 (six) hours as needed for wheezing or shortness of breath. 02/26/24  Yes Alvan Dorothyann BIRCH, MD  ALPRAZolam  (XANAX ) 0.25 MG tablet Take 1 tablet (0.25 mg total) by mouth daily as needed for anxiety. Panic attacks only. 04/08/24  Yes Breeback, Jade L, PA-C  Blood Glucose Monitoring Suppl (ACCU-CHEK AVIVA PLUS) w/Device KIT Accu-Chek Aviva Plus Meter  USE AS DIRECTED   Yes [provider]   cetirizine (ZYRTEC) 10 MG tablet Take 10 mg by mouth 2 (two) times daily. 04/21/22  Yes [provider]  EPIPEN  2-PAK 0.3 MG/0.3ML SOAJ injection Inject 0.3 mg into the muscle as needed for anaphylaxis. 12/18/21  Yes [provider]  FLUoxetine  (PROZAC ) 10 MG tablet Take one tablet daily for 7 days then decrease to one-half tablet daily for 7 days then stop. 04/08/24  Yes Breeback, Jade L, PA-C  lisdexamfetamine (VYVANSE ) 30 MG capsule Take 1 capsule (30 mg total) by mouth daily. 03/09/24  Yes Alvan Dorothyann BIRCH, MD  lisinopril  (ZESTRIL ) 10 MG tablet Take 1 tablet (10 mg total) by mouth daily. 04/08/24  Yes Breeback, Jade L, PA-C  meloxicam  (MOBIC ) 15 MG tablet TAKE 1 TABLET BY MOUTH EVERY DAY AS NEEDED 03/23/24  Yes Alvan Dorothyann BIRCH, MD  metFORMIN  (GLUCOPHAGE -XR) 500 MG 24 hr tablet Take 1,000 mg by mouth 2 (two) times daily with a meal. 05/29/23  Yes [provider]  metoprolol  succinate (TOPROL -XL) 25 MG 24 hr tablet TAKE 1 TABLET (25 MG TOTAL) BY MOUTH DAILY. 10/16/23  Yes Alvan Dorothyann BIRCH, MD  omeprazole  (PRILOSEC) 10 MG capsule TAKE 1 CAPSULE BY MOUTH EVERY DAY 07/01/23  Yes Alvan Dorothyann BIRCH, MD  ondansetron  (ZOFRAN -ODT) 8 MG disintegrating tablet Take 1 tablet (8 mg total) by mouth every 8 (eight) hours as needed. 10/07/23  Yes Breeback, Jade L, PA-C  PARoxetine  (PAXIL ) 10 MG tablet Take one-half tablet for 7 days then increase to one full tablet daily for 2 weeks then increase to two tablets daily. 04/08/24  Yes Breeback, Jade L, PA-C  predniSONE  (STERAPRED UNI-PAK 21 TAB) 10 MG (21) TBPK tablet Take by mouth daily. Take 6 tabs by mouth daily  for 2 days, then 5 tabs for 2 days, then 4 tabs for 2 days, then 3 tabs for 2 days, 2 tabs for 2 days, then 1 tab by mouth daily for 2 days 05/16/24  Yes Teddy Sharper, FNP  pregabalin  (LYRICA ) 100 MG capsule Take 1 capsule (100 mg total) by mouth 2 (two) times daily. 11/05/23  Yes Alvan Dorothyann BIRCH, MD  QUEtiapine   (SEROQUEL ) 50 MG tablet TAKE 1 TABLET BY MOUTH EVERYDAY AT BEDTIME 05/06/24  Yes Alvan Dorothyann BIRCH, MD  simvastatin (ZOCOR) 10 MG tablet Take 10 mg by mouth daily at 6 PM. 03/16/23  Yes [provider]  TRULICITY  3 MG/0.5ML SOPN Inject 3 mg into the skin once a week.   Yes [provider]    Family History Family History  Problem Relation Age of Onset   Depression Mother    Diabetes Mother    Hyperlipidemia Mother    Hypertension Mother    Hyperlipidemia Father    Hypertension Father    Cancer Maternal Grandmother    Hypertension Maternal Grandmother    Hyperlipidemia Maternal  Grandmother    Hyperlipidemia Paternal Grandmother    Cancer Maternal Uncle    Diabetes Maternal Uncle    Colon cancer Neg Hx    Breast cancer Neg Hx     Social History Social History   Tobacco Use   Smoking status: Some Days    Current packs/day: 0.00    Average packs/day: 0.5 packs/day for 15.2 years (7.6 ttl pk-yrs)    Types: Cigarettes, Cigars    Start date: 10/27/2004    Last attempt to quit: 01/22/2020    Years since quitting: 4.3   Smokeless tobacco: Never  Vaping Use   Vaping status: Never Used  Substance Use Topics   Alcohol use: Not Currently    Comment: rarely   Drug use: Never     Allergies   Gabapentin , Hydrocodone , Amphetamine -dextroamphetamine , Penicillins, Aripiprazole , Montelukast , and Zolpidem   Review of Systems Review of Systems   Physical Exam Triage Vital Signs ED Triage Vitals  Encounter Vitals Group     BP 05/16/24 0934 (!) 115/54     Girls Systolic BP Percentile --      Girls Diastolic BP Percentile --      Boys Systolic BP Percentile --      Boys Diastolic BP Percentile --      Pulse Rate 05/16/24 0934 (!) 101     Resp 05/16/24 0934 18     Temp 05/16/24 0934 98.4 F (36.9 C)     Temp Source 05/16/24 0934 Oral     SpO2 05/16/24 0934 96 %     Weight 05/16/24 0938 200 lb (90.7 kg)     Height 05/16/24 0938 5' 4 (1.626 m)     Head  Circumference --      Peak Flow --      Pain Score 05/16/24 0937 3     Pain Loc --      Pain Education --      Exclude from Growth Chart --    No data found.  Updated Vital Signs BP (!) 115/54 (BP Location: Left Arm)   Pulse (!) 101   Temp 98.4 F (36.9 C) (Oral)   Resp 18   Ht 5' 4 (1.626 m)   Wt 200 lb (90.7 kg)   LMP 04/23/2024 (Exact Date)   SpO2 96%   BMI 34.33 kg/m    Physical Exam Vitals and nursing note reviewed.  Constitutional:      Appearance: Normal appearance. She is obese.  HENT:     Head: Normocephalic and atraumatic.     Mouth/Throat:     Mouth: Mucous membranes are moist.     Pharynx: Oropharynx is clear.  Eyes:     Extraocular Movements: Extraocular movements intact.     Conjunctiva/sclera: Conjunctivae normal.     Pupils: Pupils are equal, round, and reactive to light.  Cardiovascular:     Rate and Rhythm: Normal rate and regular rhythm.     Pulses: Normal pulses.     Heart sounds: Normal heart sounds.  Pulmonary:     Effort: Pulmonary effort is normal.     Breath sounds: Normal breath sounds. No wheezing, rhonchi or rales.  Musculoskeletal:        General: Normal range of motion.     Cervical back: Normal range of motion and neck supple.     Comments: Right shoulder (anterior aspect): TTP over [facility] joint with mild soft tissue swelling noted, limited range of motion with forward flexion/extension  Skin:    General:  Skin is warm and dry.  Neurological:     General: No focal deficit present.     Mental Status: She is alert and oriented to person, place, and time. Mental status is at baseline.  Psychiatric:        Mood and Affect: Mood normal.        Behavior: Behavior normal.      UC Treatments / Results  Labs (all labs ordered are listed, but only abnormal results are displayed) Labs Reviewed - No data to display  EKG   Radiology DG Shoulder Right Result Date: 05/16/2024 CLINICAL DATA:  Right shoulder pain after pushing and pulling  a lawn mower 2 days ago. EXAM: RIGHT SHOULDER - 2+ VIEW COMPARISON:  None Available. FINDINGS: There is no evidence of fracture or dislocation. There is no evidence of arthropathy or other focal bone abnormality. Soft tissues are unremarkable. IMPRESSION: Negative. Electronically Signed   By: Elspeth Bathe M.D.   On: 05/16/2024 10:07    Procedures Procedures (including critical care time)  Medications Ordered in UC Medications - No data to display  Initial Impression / Assessment and Plan / UC Course  I have reviewed the triage vital signs and the nursing notes.  Pertinent labs & imaging results that were available during my care of the patient were reviewed by me and considered in my medical decision making (see chart for details).     MDM: 1.  Acute pain of right shoulder-x-ray of right shoulder reveals above patient advised;  2.  Strain of right shoulder, initial encounter-Rx'd Sterapred Unipak (42 tab 10 mg taper). Advised patient to RICE right shoulder for 30 minutes 3 times daily for the next 3 days.  Advised patient to take medication as directed with food to completion.  Encouraged to increase daily water intake to 64 ounces per day while taking this medication.  Advised if symptoms worsen and/or unresolved please follow-up with [facility] for further evaluation.  Contact information provided with his AVS today.  Patient discharged home, hemodynamically stable. Final Clinical Impressions(s) / UC Diagnoses   Final diagnoses:  Strain of right shoulder, initial encounter  Acute pain of right shoulder     Discharge Instructions      Advised patient to RICE right shoulder for 30 minutes 3 times daily for the next 3 days.  Advised patient to take medication as directed with food to completion.  Encouraged to increase daily water intake to 64 ounces per day while taking this medication.  Advised if symptoms worsen and/or unresolved please follow-up with [facility] for further evaluation.  Contact information provided with his AVS today.     ED Prescriptions     Medication Sig Dispense Auth. Provider   predniSONE  (STERAPRED UNI-PAK 21 TAB) 10 MG (21) TBPK tablet Take by mouth daily. Take 6 tabs by mouth daily  for 2 days, then 5 tabs for 2 days, then 4 tabs for 2 days, then 3 tabs for 2 days, 2 tabs for 2 days, then 1 tab by mouth daily for 2 days 42 tablet Teddy Sharper, FNP      PDMP not reviewed this encounter.   Teddy Sharper, FNP 05/16/24 1105

## 2024-05-16 NOTE — Discharge Instructions (Addendum)
 Advised patient to RICE right shoulder for 30 minutes 3 times daily for the next 3 days.  Advised patient to take medication as directed with food to completion.  Encouraged to increase daily water intake to 64 ounces per day while taking this medication.  Advised if symptoms worsen and/or unresolved please follow-up with [facility] for further evaluation.  Contact information provided with his AVS today.

## 2024-05-16 NOTE — ED Triage Notes (Signed)
 Patient c/o right shoulder pain/injury x 2 days.  Patient states that she was mowing the yard and was doing a lot pushing and pulling, now having shoulder pain.  Denies any OTC pain meds.

## 2024-05-16 NOTE — Telephone Encounter (Signed)
 We can definitely try daily but just make sure that she is not more acutely sick.  We can work her in this afternoon if we need to.

## 2024-05-17 NOTE — Telephone Encounter (Signed)
Attempted call to patient. Mail box full. Could not leave a voice mail message.  

## 2024-05-24 MED ORDER — BUDESONIDE-FORMOTEROL FUMARATE 160-4.5 MCG/ACT IN AERO: 2.0000 | INHALATION_SPRAY | Freq: Two times a day (BID) | RESPIRATORY_TRACT | 3 refills | Status: AC

## 2024-05-25 ENCOUNTER — Ambulatory Visit: Payer: Self-pay

## 2024-05-25 ENCOUNTER — Encounter: Payer: Self-pay | Admitting: Family Medicine

## 2024-05-25 NOTE — Telephone Encounter (Signed)
   FYI Only or Action Required?: Action required by provider: update on patient condition and Patient going back to Emergency Room for worsening of symptoms.  Patient was last seen in primary care on 04/08/2024 by Antoniette Vermell CROME, PA-C.  Called Nurse Triage reporting Numbness.  Symptoms began yesterday and worsened today around 12:30 PM.  Interventions attempted: Rest, hydration, or home remedies and Other: Urgent Care visit and then ER visit.  Symptoms are: gradually worsening.  Triage Disposition: Go to ED Now (or PCP Triage)  Patient/caregiver understands and will follow disposition?: Yes                Copied from CRM [id number]. Topic: Clinical - Red Word Triage >> May 25, 2024  2:42 PM Kevelyn M wrote: Red Word that prompted transfer to Nurse Triage: Injured right shoulder and went Er yesterday. Now her face is numb. Reason for Disposition  Headache  Answer Assessment - Initial Assessment Questions Right shoulder injury on the 18th Patient went to Urgent Care on the 21st Yesterday patient went to an Emergency Room because her right arm was going numb She states that today around 12:30 PM the numbness was felt in her face as well She does endorse a headache and neck pain  Patient states that all she had done was an Xray and no scans of any kind  Patient denies any drooping of her face or any changes in her speech Patient is advised that with worsening numbness with a headache and neck pain after a shoulder injury---worsening after already being assessed--it is recommended that she be assessed for this at the Emergency Room Patient has someone to drive her there to be evaluated Patient states she is going back to the Emergency Room for further evaluation of her symptoms getting worse       1. SYMPTOM: What is the main symptom you are concerned about? (e.g., weakness, numbness)     Numbness to her face now 2. ONSET: When did this start? (e.g., minutes,  hours, days; while sleeping)     Right side around 12:30 PM today but later started being the left side as well 3. LAST NORMAL: When was the last time you (the patient) were normal (no symptoms)?     Before today 4. PATTERN Does this come and go, or has it been constant since it started?  Is it present now?     Constant  present now yes 5. CARDIAC SYMPTOMS: Have you had any of the following symptoms: chest pain, difficulty breathing, palpitations?     ----- 6. NEUROLOGIC SYMPTOMS: Have you had any of the following symptoms: headache, dizziness, vision loss, double vision, changes in speech, unsteady on your feet?     numbness 7. OTHER SYMPTOMS: Do you have any other symptoms?     Headache, neck pain  Protocols used: Neurologic Deficit-A-AH

## 2024-05-26 ENCOUNTER — Ambulatory Visit: Admitting: Sports Medicine

## 2024-05-30 ENCOUNTER — Encounter: Payer: Self-pay | Admitting: Family Medicine

## 2024-05-30 ENCOUNTER — Ambulatory Visit (INDEPENDENT_AMBULATORY_CARE_PROVIDER_SITE_OTHER): Admitting: Family Medicine

## 2024-05-30 VITALS — BP 123/73 | HR 94 | Ht 64.0 in | Wt 204.0 lb

## 2024-05-30 DIAGNOSIS — F32A Depression, unspecified: Secondary | ICD-10-CM

## 2024-05-30 DIAGNOSIS — I10 Essential (primary) hypertension: Secondary | ICD-10-CM

## 2024-05-30 DIAGNOSIS — E1165 Type 2 diabetes mellitus with hyperglycemia: Secondary | ICD-10-CM

## 2024-05-30 DIAGNOSIS — F4310 Post-traumatic stress disorder, unspecified: Secondary | ICD-10-CM | POA: Diagnosis not present

## 2024-05-30 DIAGNOSIS — F419 Anxiety disorder, unspecified: Secondary | ICD-10-CM | POA: Diagnosis not present

## 2024-05-30 DIAGNOSIS — F410 Panic disorder [episodic paroxysmal anxiety] without agoraphobia: Secondary | ICD-10-CM

## 2024-05-30 MED ORDER — PAROXETINE HCL 10 MG PO TABS: 15.0000 mg | ORAL_TABLET | Freq: Every day | ORAL | 1 refills | Status: AC

## 2024-05-30 NOTE — Assessment & Plan Note (Signed)
 Increase Paxil  to 15 mg and follow-up with therapist they are also referring her to psychiatry so they may end up taking over some of her medications.

## 2024-05-30 NOTE — Assessment & Plan Note (Addendum)
 A1C in the ED was 6.6.  follow with Endocinrology.  Doing well on Trulicity  and metformin . On ACE

## 2024-05-30 NOTE — Progress Notes (Signed)
 Established Patient Office Visit  Subjective  Patient ID: Rachael Fowler, female    DOB: 12-16-1985  Age: 38 y.o. MRN: [id number]  Chief Complaint  Patient presents with   Follow-up    Pt reports that she is her to discuss her recent change to Paxil . I asked her how she felt the medication was working for her she stated that  she was unsure due to being on steroids. She said that she has been off of the steroids x 1 week    HPI  Recently switched to paxil , off of prozac , at end of June.  She is not sure how well she feels it is working she just recently got off steroids and she has been dealing with some radiculopathy from her neck going down to her right arm and back she has been to the ED/urgent care a couple times in that regard.  She is still on 10 mg dose.  She has also had some financial stressors recently.  He also wanted to mention that she still has a residual cough with some occasional phlegm after respiratory virus that she picked up from her son about 3 weeks ago.  She still feels just a little winded with that at times she did just restart her Symbicort  yesterday.    ROS    Objective:     BP 123/73   Pulse 94   Ht 5' 4 (1.626 m)   Wt 204 lb 0.6 oz (92.6 kg)   LMP 04/23/2024 (Exact Date)   SpO2 99%   BMI 35.02 kg/m    Physical Exam Vitals and nursing note reviewed.  Constitutional:      Appearance: Normal appearance.  HENT:     Head: Normocephalic and atraumatic.  Eyes:     Conjunctiva/sclera: Conjunctivae normal.  Cardiovascular:     Rate and Rhythm: Normal rate and regular rhythm.  Pulmonary:     Effort: Pulmonary effort is normal.     Breath sounds: Normal breath sounds.  Skin:    General: Skin is warm and dry.  Neurological:     Mental Status: She is alert.  Psychiatric:        Mood and Affect: Mood normal.      Results for orders placed or performed in visit on 05/30/24  Hemoglobin A1c  Result Value Ref Range   Hemoglobin A1C 6.6        The ASCVD Risk score (Arnett DK, et al., 2019) failed to calculate for the following reasons:   The 2019 ASCVD risk score is only valid for ages 35 to 42    Assessment & Plan:   Problem List Items Addressed This Visit       Cardiovascular and Mediastinum   Hypertension - Primary   BP looks awesome today.         Endocrine   Diabetes mellitus (HCC)   A1C in the ED was 6.6.  follow with Endocinrology.  Doing well on Trulicity  and metformin . On ACE         Other   PTSD (post-traumatic stress disorder)   Inc Paxil  to 15mg  and she is getting a referral to psychiatry. Still seeing her therapist once a week. The seroquel  has been really helpful for decreasing her nightmares.        Relevant Medications   PARoxetine  (PAXIL ) 10 MG tablet   Panic attack   Relevant Medications   PARoxetine  (PAXIL ) 10 MG tablet   Anxiety and depression   Increase Paxil  to  15 mg and follow-up with therapist they are also referring her to psychiatry so they may end up taking over some of her medications.      Relevant Medications   PARoxetine  (PAXIL ) 10 MG tablet    Cough-continue with Symbicort  that should help but lungs are clear on exam today which is very reassuring.  Return in about 6 months (around 11/30/2024) for Hypertension, Diabetes follow-up.    Dorothyann Byars, MD

## 2024-05-30 NOTE — Assessment & Plan Note (Signed)
BP looks awesome today. 

## 2024-05-30 NOTE — Assessment & Plan Note (Addendum)
 Inc Paxil  to 15mg  and she is getting a referral to psychiatry. Still seeing her therapist once a week. The seroquel  has been really helpful for decreasing her nightmares.

## 2024-05-31 ENCOUNTER — Other Ambulatory Visit: Payer: Self-pay | Admitting: Medical Genetics

## 2024-06-12 ENCOUNTER — Other Ambulatory Visit: Payer: Self-pay | Admitting: Family Medicine

## 2024-06-28 ENCOUNTER — Encounter: Payer: Self-pay | Admitting: Sports Medicine

## 2024-08-12 ENCOUNTER — Other Ambulatory Visit: Payer: Self-pay | Admitting: Medical Genetics

## 2024-08-12 DIAGNOSIS — Z006 Encounter for examination for normal comparison and control in clinical research program: Secondary | ICD-10-CM

## 2024-08-29 ENCOUNTER — Ambulatory Visit

## 2024-08-29 ENCOUNTER — Ambulatory Visit
Admission: RE | Admit: 2024-08-29 | Discharge: 2024-08-29 | Disposition: A | Discharge status: Home / Self Care | Source: Ambulatory Visit | Attending: Family Medicine | Admitting: Family Medicine

## 2024-08-29 VITALS — BP 110/83 | HR 105 | Temp 98.8°F | Resp 18

## 2024-08-29 DIAGNOSIS — L2082 Flexural eczema: Secondary | ICD-10-CM | POA: Diagnosis not present

## 2024-08-29 DIAGNOSIS — R051 Acute cough: Secondary | ICD-10-CM | POA: Diagnosis not present

## 2024-08-29 DIAGNOSIS — R110 Nausea: Secondary | ICD-10-CM

## 2024-08-29 MED ORDER — ONDANSETRON HCL 8 MG PO TABS: 8.0000 mg | ORAL_TABLET | Freq: Three times a day (TID) | ORAL | 0 refills | Status: DC | PRN

## 2024-08-29 MED ORDER — TRIAMCINOLONE ACETONIDE 0.1 % EX CREA: 1.0000 | TOPICAL_CREAM | Freq: Two times a day (BID) | CUTANEOUS | 0 refills | Status: AC

## 2024-08-29 MED ORDER — CEFDINIR 300 MG PO CAPS: 300.0000 mg | ORAL_CAPSULE | Freq: Two times a day (BID) | ORAL | 0 refills | Status: DC

## 2024-08-29 MED ORDER — PREDNISONE 50 MG PO TABS: ORAL_TABLET | ORAL | 0 refills | Status: DC

## 2024-08-29 NOTE — ED Triage Notes (Signed)
 Pt presents to UC with c/o a cough and congestion since 10/13. And nausea and vomiting beginning Friday. States went to [facility] on 10/20. Was tested for Covid/Flu and Strep. Testing negative. States provider felt like she may have had pneumonia due to lung sounds but they were able to do xrays.

## 2024-08-29 NOTE — ED Provider Notes (Addendum)
 [name]    CSN: [id number] Arrival date & time: 08/29/24  9171      History   Chief Complaint Chief Complaint  Patient presents with   Cough    Went to urgent care recently, I've finished my round of antibiotics and steroids but I am still sick. I've also started have nausea and vomiting. Along with my eczema rashes being really bad - Entered by patient   Nausea   Emesis    HPI Rachael Fowler is a 38 y.o. female.   HPI  Patient is known to me from prior encounters.  She has frequent respiratory infections.  She has a history of asthma and continues to smoke cigarettes.  She considers herself to be of low immunity because of her frequent infections that are difficult to treat.  She is here with her son and they both have had a cough for over 3 weeks.  She has been treated with doxycycline  and steroids.  She felt somewhat better, but not completely.  Now her cough is worsening again.  No fever or chills.  She is feeling short of breath.  She sometimes has chest pain with deep breath and coughing.  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/2021   Chronic bilateral low back pain without sciatica 05/20/2022   Chronic fatigue, unspecified 12/29/2022   Chronic hypertension 03/12/2021   Depression and anxiety    Diabetes mellitus (HCC) 12/29/2022   Diagnosis right around time of +UPT     Diabetes mellitus without complication (HCC)    Hirsutism 05/26/2018   Hypertension    Itching 12/18/2021   Lumbar spondylosis 08/17/2018   Migraines 05/26/2018   Moderate persistent asthma with exacerbation 02/05/2018   No evidence of joint hypermobility syndrome 01/15/2022   Genetic testing was negative through [facility]     Obesity 12/29/2022   Palpitations 01/14/2022   Patellofemoral syndrome, bilateral 08/17/2018    PCOS (polycystic ovarian syndrome)    Psychophysiological insomnia 05/26/2018   Rash and other nonspecific skin eruption 12/29/2022   S/P primary low transverse C-section 05/18/2018   Small fiber neuropathy 10/17/2021   Diagnosed by neurology in 2022.     Tobacco use 05/18/2018   Type 2 diabetes mellitus without complication, without long-term current use of insulin (HCC) 08/19/2017    Patient Active Problem List   Diagnosis Date Noted   PTSD (post-traumatic stress disorder) 04/08/2024   Nightmares 04/08/2024   Panic attack 11/05/2023   Asthma    Depression and anxiety    Hypertension    PCOS (polycystic ovarian syndrome)    Chronic fatigue, unspecified 12/29/2022   Diabetes mellitus (HCC) 12/29/2022   Obesity 12/29/2022   Chronic bilateral low back pain without sciatica 05/20/2022   No evidence of joint hypermobility syndrome 01/15/2022   Palpitations 01/14/2022   Itching 12/18/2021   Attention deficit hyperactivity disorder (ADHD), combined type 10/17/2021   Small fiber neuropathy 10/17/2021   Patellofemoral syndrome, bilateral 08/17/2018   Lumbar spondylosis 08/17/2018   Hirsutism 05/26/2018   Abnormal WBC count 05/26/2018   Psychophysiological insomnia 05/26/2018   Anxiety and depression 05/26/2018   Migraines 05/26/2018   S/P primary low transverse C-section 05/18/2018   Tobacco use 05/18/2018   Moderate persistent asthma with exacerbation 02/05/2018   Type 2 diabetes mellitus without complication, without long-term current use  of insulin (HCC) 08/19/2017   OTHER ACUTE REACTIONS TO STRESS 12/04/2009    Past Surgical History:  Procedure Laterality Date   CESAREAN SECTION     KNEE SURGERY  04/20/2006   x2    OB History     Gravida  1   Para  1   Term  1   Preterm      AB      Living  1      SAB      IAB      Ectopic      Multiple      Live Births  1            Home Medications    Prior to Admission medications   Medication Sig  Start Date End Date Taking? Authorizing Provider  cefdinir  (OMNICEF ) 300 MG capsule Take 1 capsule (300 mg total) by mouth 2 (two) times daily. 08/29/24  Yes Maranda Jamee Jacob, MD  ondansetron  (ZOFRAN ) 8 MG tablet Take 1 tablet (8 mg total) by mouth every 8 (eight) hours as needed for nausea or vomiting. 08/29/24  Yes Maranda Jamee Jacob, MD  predniSONE  (DELTASONE ) 50 MG tablet Take once a day for 5 days.  Take with food 08/29/24  Yes Maranda Jamee Jacob, MD  triamcinolone  cream (KENALOG ) 0.1 % Apply 1 Application topically 2 (two) times daily. 08/29/24  Yes Maranda Jamee Jacob, MD  ACCU-CHEK GUIDE test strip Use as directed 01/15/22   Alvan Dorothyann BIRCH, MD  albuterol  (VENTOLIN  HFA) 108 [phone] Base) MCG/ACT inhaler Inhale 2 puffs into the lungs every 6 (six) hours as needed for wheezing or shortness of breath. 02/26/24   Alvan Dorothyann BIRCH, MD  ALPRAZolam  (XANAX ) 0.25 MG tablet Take 1 tablet (0.25 mg total) by mouth daily as needed for anxiety. Panic attacks only. 04/08/24   Breeback, Jade L, PA-C  Blood Glucose Monitoring Suppl (ACCU-CHEK AVIVA PLUS) w/Device KIT Accu-Chek Aviva Plus Meter  USE AS DIRECTED    [provider]  budesonide -formoterol  (SYMBICORT ) 160-4.5 MCG/ACT inhaler Inhale 2 puffs into the lungs 2 (two) times daily. 05/24/24   Alvan Dorothyann BIRCH, MD  cetirizine (ZYRTEC) 10 MG tablet Take 10 mg by mouth 2 (two) times daily. 04/21/22   [provider]  EPIPEN  2-PAK 0.3 MG/0.3ML SOAJ injection Inject 0.3 mg into the muscle as needed for anaphylaxis. 12/18/21   [provider]  lisdexamfetamine (VYVANSE ) 30 MG capsule Take 1 capsule (30 mg total) by mouth daily. 03/09/24   Alvan Dorothyann BIRCH, MD  lisinopril  (ZESTRIL ) 10 MG tablet Take 1 tablet (10 mg total) by mouth daily. 04/08/24   Breeback, Jade L, PA-C  meloxicam  (MOBIC ) 15 MG tablet TAKE 1 TABLET BY MOUTH EVERY DAY AS NEEDED 03/23/24   Alvan Dorothyann BIRCH, MD  metFORMIN  (GLUCOPHAGE -XR) 500 MG 24 hr tablet  Take 1,000 mg by mouth 2 (two) times daily with a meal. 05/29/23   [provider]  metoprolol  succinate (TOPROL -XL) 25 MG 24 hr tablet TAKE 1 TABLET (25 MG TOTAL) BY MOUTH DAILY. 06/13/24   Alvan Dorothyann BIRCH, MD  omeprazole  (PRILOSEC) 10 MG capsule TAKE 1 CAPSULE BY MOUTH EVERY DAY 07/01/23   Alvan Dorothyann BIRCH, MD  PARoxetine  (PAXIL ) 10 MG tablet Take 1.5 tablets (15 mg total) by mouth daily. 05/30/24   Alvan Dorothyann BIRCH, MD  QUEtiapine  (SEROQUEL ) 50 MG tablet TAKE 1 TABLET BY MOUTH EVERYDAY AT BEDTIME 06/13/24   Alvan Dorothyann BIRCH, MD  simvastatin (ZOCOR) 10 MG tablet Take 10  mg by mouth daily at 6 PM. 03/16/23   [provider]  TRULICITY  3 MG/0.5ML SOPN Inject 3 mg into the skin once a week.    [provider]    Family History Family History  Problem Relation Age of Onset   Depression Mother    Diabetes Mother    Hyperlipidemia Mother    Hypertension Mother    Hyperlipidemia Father    Hypertension Father    Cancer Maternal Grandmother    Hypertension Maternal Grandmother    Hyperlipidemia Maternal Grandmother    Hyperlipidemia Paternal Grandmother    Cancer Maternal Uncle    Diabetes Maternal Uncle    Colon cancer Neg Hx    Breast cancer Neg Hx     Social History Social History   Tobacco Use   Smoking status: Some Days    Current packs/day: 0.00    Average packs/day: 0.5 packs/day for 15.2 years (7.6 ttl pk-yrs)    Types: Cigarettes, Cigars    Start date: 10/27/2004    Last attempt to quit: 01/22/2020    Years since quitting: 4.6   Smokeless tobacco: Never  Vaping Use   Vaping status: Never Used  Substance Use Topics   Alcohol use: Not Currently    Comment: rarely   Drug use: Never     Allergies   Gabapentin , Hydrocodone , Amphetamine -dextroamphetamine , Penicillins, Aripiprazole , Montelukast , and Zolpidem   Review of Systems Review of Systems  See HPI Physical Exam Triage Vital Signs ED Triage Vitals  Encounter Vitals Group      BP 08/29/24 0859 110/83     Girls Systolic BP Percentile --      Girls Diastolic BP Percentile --      Boys Systolic BP Percentile --      Boys Diastolic BP Percentile --      Pulse Rate 08/29/24 0859 (!) 105     Resp 08/29/24 0859 18     Temp 08/29/24 0859 98.8 F (37.1 C)     Temp Source 08/29/24 0859 Oral     SpO2 08/29/24 0859 98 %     Weight --      Height --      Head Circumference --      Peak Flow --      Pain Score 08/29/24 0856 2     Pain Loc --      Pain Education --      Exclude from Growth Chart --    No data found.  Updated Vital Signs BP 110/83 (BP Location: Right Arm)   Pulse (!) 105   Temp 98.8 F (37.1 C) (Oral)   Resp 18   LMP 08/19/2024 (Exact Date)   SpO2 98%      Physical Exam Constitutional:      General: She is not in acute distress.    Appearance: She is well-developed. She is ill-appearing.  HENT:     Head: Normocephalic and atraumatic.     Right Ear: Tympanic membrane normal.     Left Ear: Tympanic membrane normal.     Nose: Nose normal.     Mouth/Throat:     Mouth: Mucous membranes are moist.     Pharynx: No posterior oropharyngeal erythema.  Eyes:     Conjunctiva/sclera: Conjunctivae normal.     Pupils: Pupils are equal, round, and reactive to light.  Cardiovascular:     Rate and Rhythm: Normal rate and regular rhythm.  Pulmonary:     Effort: Pulmonary effort is normal. No  respiratory distress.     Breath sounds: Wheezing and rhonchi present.  Abdominal:     General: There is no distension.     Palpations: Abdomen is soft.  Musculoskeletal:        General: Normal range of motion.     Cervical back: Normal range of motion.  Skin:    General: Skin is warm and dry.     Findings: Rash present.     Comments: Large patch of eczema is noted behind each knee, 15 or 20 cm across.  There is some vesicles and scale  Neurological:     Mental Status: She is alert.      UC Treatments / Results  Labs (all labs ordered are  listed, but only abnormal results are displayed) Labs Reviewed - No data to display  EKG   Radiology DG Chest 2 View Result Date: 08/29/2024 EXAM: PA AND LATERAL (2) VIEW(S) XRAY OF THE CHEST 08/29/2024 09:28:00 AM COMPARISON: PA and lateral radiographs of the chest dated 12/29/2022. CLINICAL HISTORY: cough 4 weeks FINDINGS: LUNGS AND PLEURA: No focal pulmonary opacity. No pulmonary edema. No pleural effusion. No pneumothorax. HEART AND MEDIASTINUM: No acute abnormality of the cardiac and mediastinal silhouettes. BONES AND SOFT TISSUES: No acute osseous abnormality. IMPRESSION: 1. No acute process. Electronically signed by: Evalene Coho MD 08/29/2024 10:07 AM EST RP Workstation: [id number]    Procedures Procedures (including critical care time)  Medications Ordered in UC Medications - No data to display  Initial Impression / Assessment and Plan / UC Course  I have reviewed the triage vital signs and the nursing notes.  Pertinent labs & imaging results that were available during my care of the patient were reviewed by me and considered in my medical decision making (see chart for details).   I had some difficulty choosing an antibiotic.  Because of her Seroquel  use it is not recommended she take azithromycin  or quinolone.  She states that she cannot take any penicillins.  She just finished a course of doxycycline  which did not help her.  She cites intolerance to some cephalosporins in the caused nausea.  I have encouraged her to try to take the Omnicef , with Zofran , to take for this remaining bronchial infection.  See PCP if fails to improve  Final Clinical Impressions(s) / UC Diagnoses   Final diagnoses:  Acute cough  Nausea  Flexural eczema     Discharge Instructions      Take Omnicef  antibiotic 2 times a day Drink lots of water Take prednisone  once a day for 5 days Continue using your asthma inhalers I have prescribed triamcinolone  cream to use on your rash I have  prescribed Zofran  to help with the nausea See your doctor if not better by the end of the week   ED Prescriptions     Medication Sig Dispense Auth. Provider   ondansetron  (ZOFRAN ) 8 MG tablet Take 1 tablet (8 mg total) by mouth every 8 (eight) hours as needed for nausea or vomiting. 20 tablet Maranda Jamee Jacob, MD   predniSONE  (DELTASONE ) 50 MG tablet Take once a day for 5 days.  Take with food 5 tablet Maranda Jamee Jacob, MD   cefdinir  (OMNICEF ) 300 MG capsule Take 1 capsule (300 mg total) by mouth 2 (two) times daily. 20 capsule Maranda Jamee Jacob, MD   triamcinolone  cream (KENALOG ) 0.1 % Apply 1 Application topically 2 (two) times daily. 30 g Maranda Jamee Jacob, MD      PDMP not reviewed this encounter.  Maranda Jamee Jacob, MD 08/29/24 1234    Maranda Jamee Jacob, MD 08/29/24 1235

## 2024-08-29 NOTE — Discharge Instructions (Signed)
 Take Omnicef  antibiotic 2 times a day Drink lots of water Take prednisone  once a day for 5 days Continue using your asthma inhalers I have prescribed triamcinolone  cream to use on your rash I have prescribed Zofran  to help with the nausea See your doctor if not better by the end of the week

## 2024-09-16 ENCOUNTER — Other Ambulatory Visit: Payer: Self-pay | Admitting: Family Medicine

## 2024-10-22 ENCOUNTER — Other Ambulatory Visit: Payer: Self-pay | Admitting: Family Medicine

## 2024-11-07 LAB — HEMOGLOBIN A1C: Hemoglobin A1C: 9.6

## 2024-11-29 DIAGNOSIS — E781 Pure hyperglyceridemia: Secondary | ICD-10-CM | POA: Insufficient documentation

## 2024-11-29 NOTE — Progress Notes (Unsigned)
" ° °  Established Patient Office Visit  Patient ID: Embree Brawley, female    DOB: February 13, 1986  Age: 39 y.o. MRN: [id number] PCP: Alvan Dorothyann BIRCH, MD  No chief complaint on file.   Subjective:     HPI  Discussed the use of AI scribe software for clinical note transcription with the patient, who gave verbal consent to proceed.  History of Present Illness    {History (Optional):23778}  ROS    Objective:     There were no vitals taken for this visit. {Vitals History (Optional):23777}  Physical Exam  {PhysExam Abridge (Optional):210964309} No results found for any visits on 11/30/24.  {Labs (Optional):23779}  The ASCVD Risk score (Arnett DK, et al., 2019) failed to calculate for the following reasons:   The 2019 ASCVD risk score is only valid for ages 38 to 66    Assessment & Plan:   Problem List Items Addressed This Visit       Cardiovascular and Mediastinum   Hypertension associated with diabetes (HCC)   Hypertension - Primary     Nervous and Auditory   Small fiber neuropathy    Assessment and Plan Assessment & Plan     No follow-ups on file.    Dorothyann Alvan, MD [facility] at [facility]   "

## 2024-11-30 ENCOUNTER — Encounter: Admitting: Family Medicine

## 2024-11-30 DIAGNOSIS — I10 Essential (primary) hypertension: Secondary | ICD-10-CM
